# Patient Record
Sex: Male | Born: 1975
Health system: Southern US, Community
[De-identification: ages and names within clinical notes are randomized; demographics above are authoritative.]

## PROBLEM LIST (undated history)

## (undated) DIAGNOSIS — C801 Malignant (primary) neoplasm, unspecified: Secondary | ICD-10-CM

## (undated) HISTORY — PX: FRACTURE SURGERY: SHX138

## (undated) HISTORY — DX: Malignant (primary) neoplasm, unspecified: C80.1

---

## 2002-03-01 ENCOUNTER — Encounter: Payer: Self-pay | Admitting: *Deleted

## 2002-03-01 ENCOUNTER — Emergency Department (HOSPITAL_COMMUNITY): Admission: EM | Admit: 2002-03-01 | Discharge: 2002-03-01 | Payer: Self-pay | Admitting: *Deleted

## 2002-03-11 ENCOUNTER — Ambulatory Visit (HOSPITAL_BASED_OUTPATIENT_CLINIC_OR_DEPARTMENT_OTHER): Admission: RE | Admit: 2002-03-11 | Discharge: 2002-03-12 | Payer: Self-pay | Admitting: Orthopedic Surgery

## 2008-06-23 ENCOUNTER — Emergency Department (HOSPITAL_COMMUNITY): Admission: EM | Admit: 2008-06-23 | Discharge: 2008-06-23 | Payer: Self-pay | Admitting: Emergency Medicine

## 2008-06-23 IMAGING — CR DG CHEST 1V PORT
1 series · 1 of 1 positions shown · non-contrast
Comparison: None.

CLINICAL DATA: Smoker with chest pain shortness of breath.

PORTABLE CHEST - 1 VIEW

[AP]
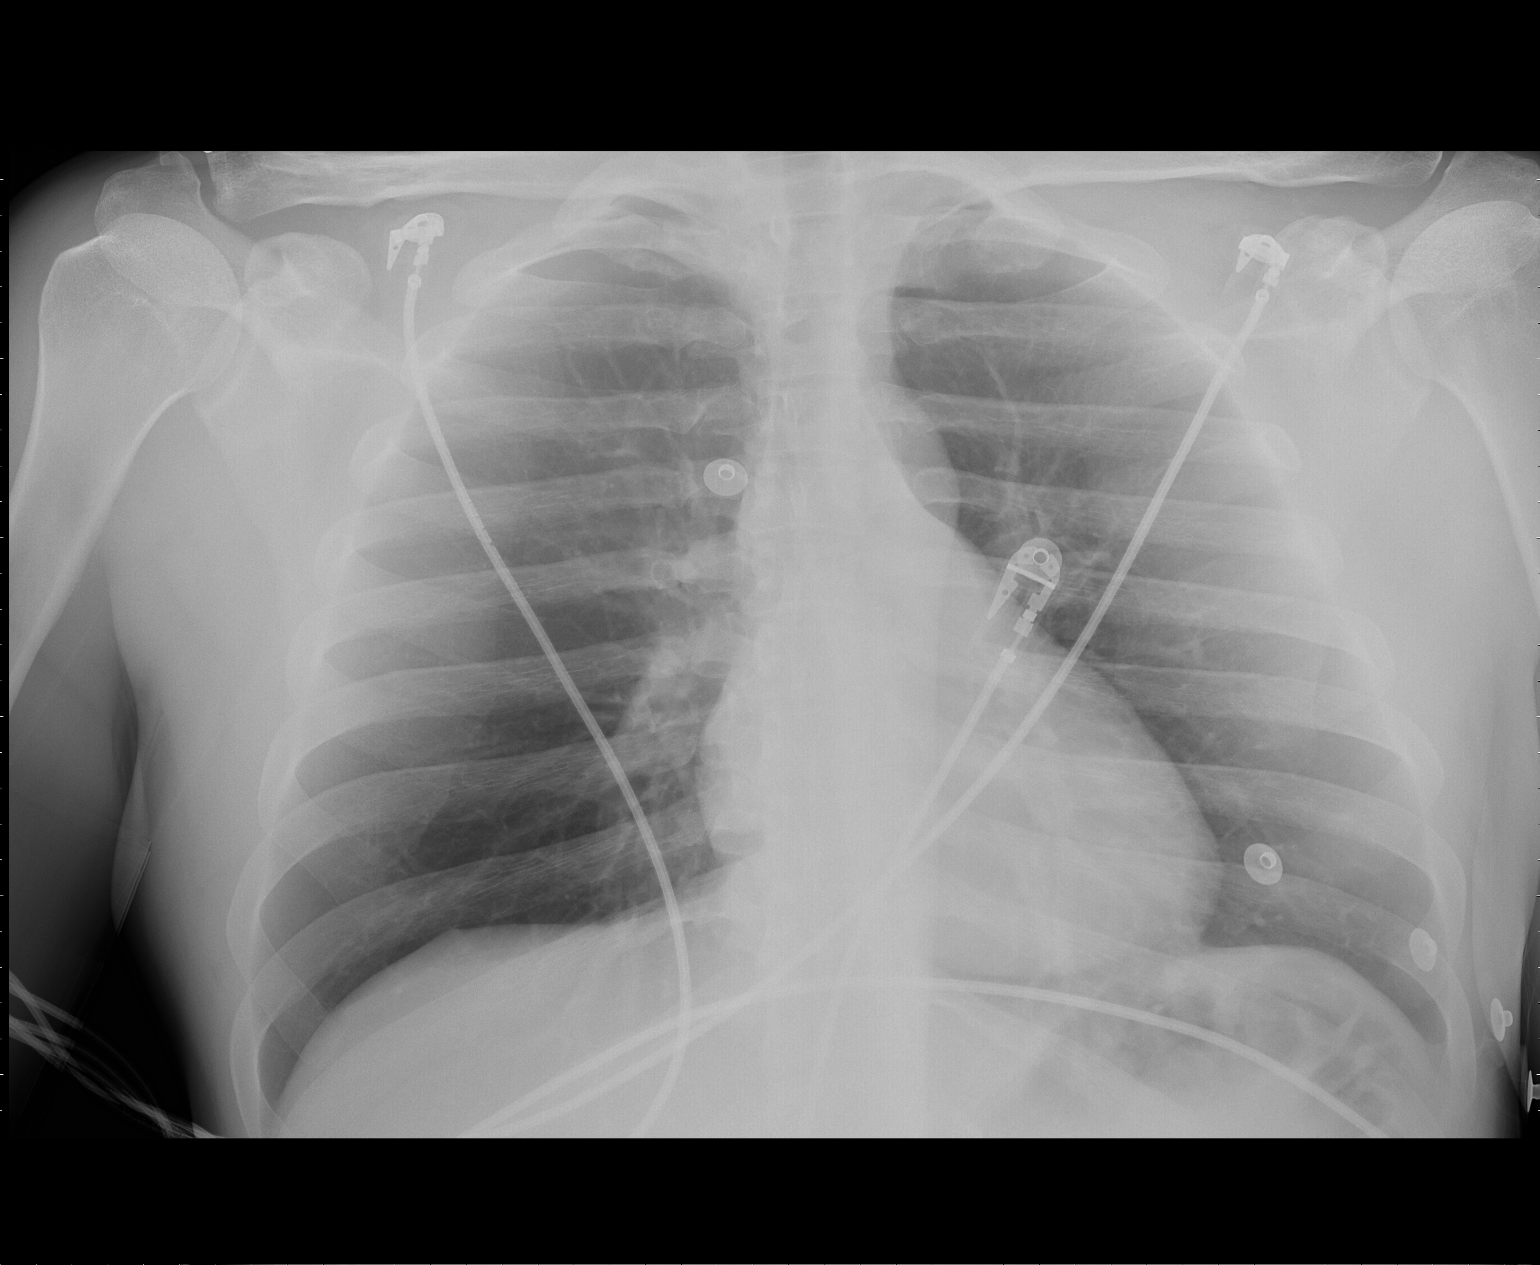

[1 of 1 positions shown; findings below may reference images not displayed]

FINDINGS: Normal sized heart.  Clear lungs with normal vascularity.
Minimal scoliosis.
IMPRESSION: No acute abnormality.

## 2008-06-23 IMAGING — CT CT ANGIO CHEST
2 of 8 series · 19 of 36 positions shown · IV contrast (APPLIED)
Comparison: Portable chest obtained earlier today.

CLINICAL DATA: Chest pain.  Elevated D-dimer.  A history of
shortness of breath was also given.

CT ANGIOGRAPHY CHEST
TECHNIQUE: Multidetector CT imaging of the chest using the
standard protocol during bolus administration of intravenous
contrast. Multiplanar reconstructed images obtained and reviewed to
evaluate the vascular anatomy.
Contrast: 80 ml [9J]

[Series 11: pulm embolism 1.0 b25f thins · axial · 0.71mm/px · z∈[+1177,+1448]mm · 18 of 303 slices shown]
[im 16/303  lung]
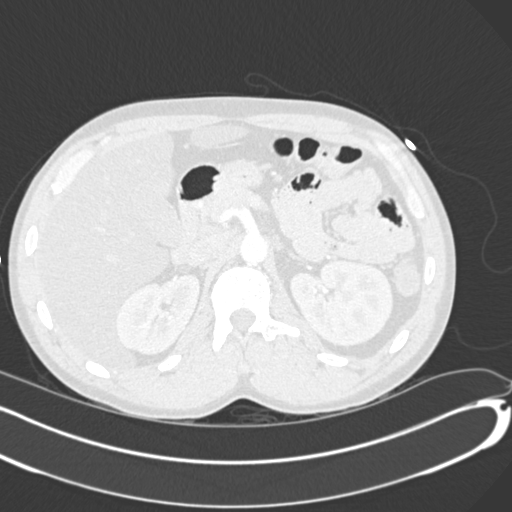
[im 32/303  mediastinal]
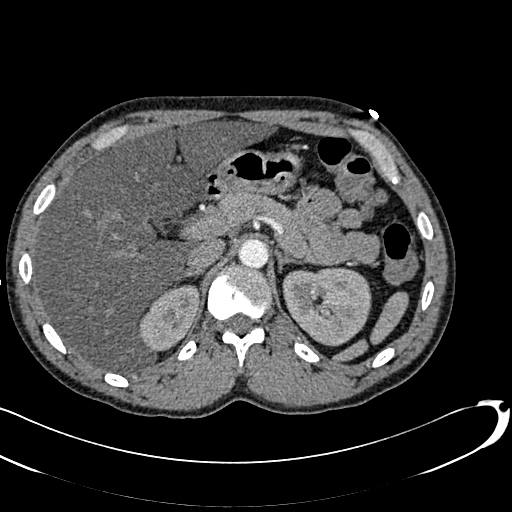
[im 48/303  lung]
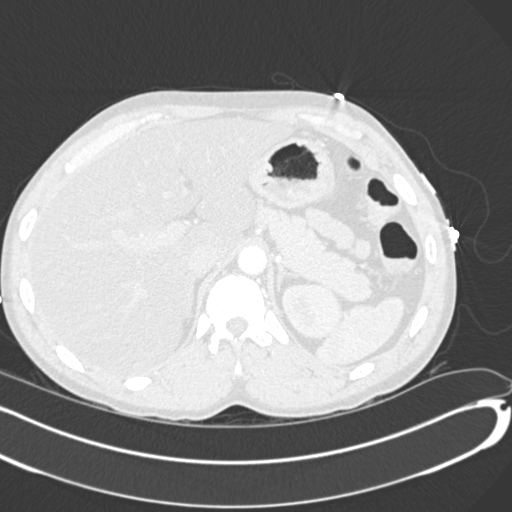
[im 64/303  mediastinal]
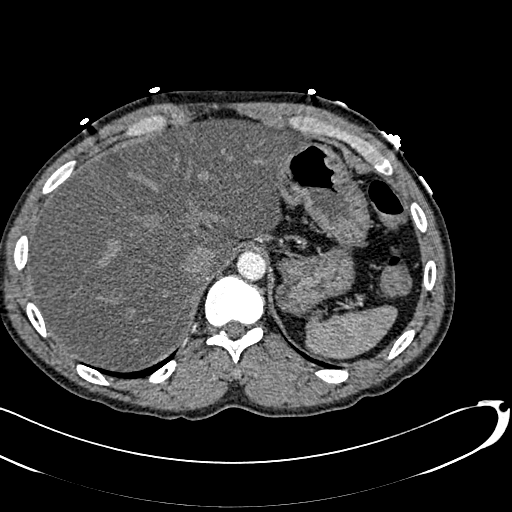
[im 80/303  lung]
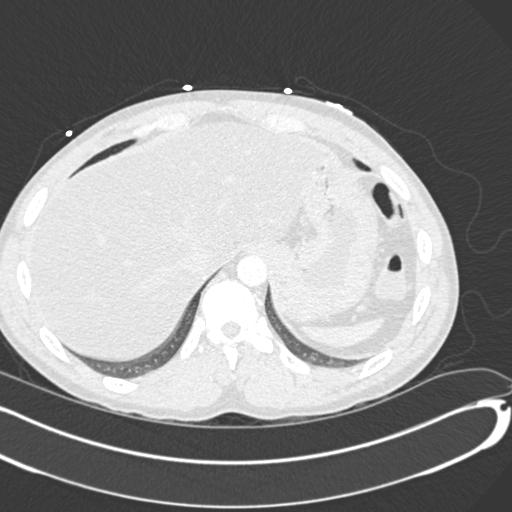
[im 96/303  mediastinal]
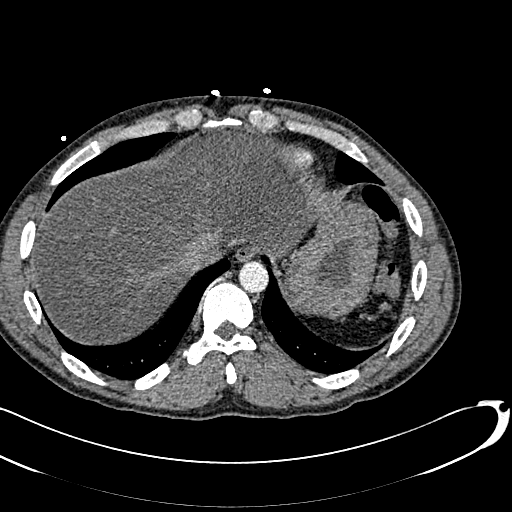
[im 112/303  lung]
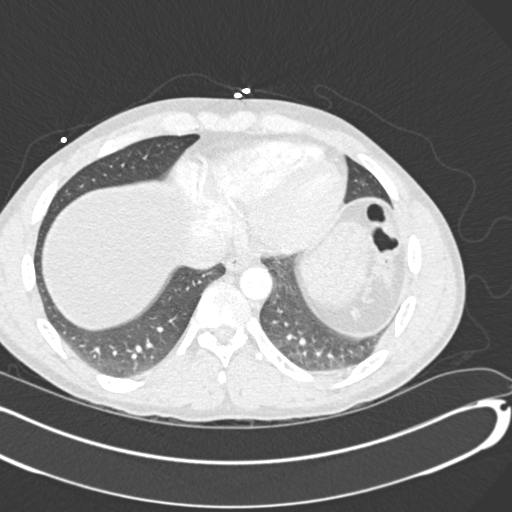
[im 128/303  mediastinal]
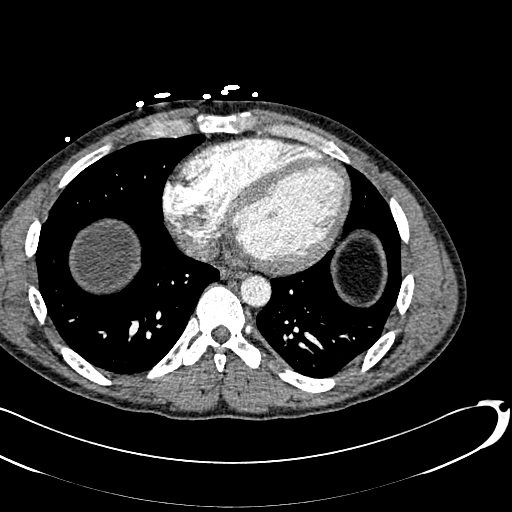
[im 144/303  lung]
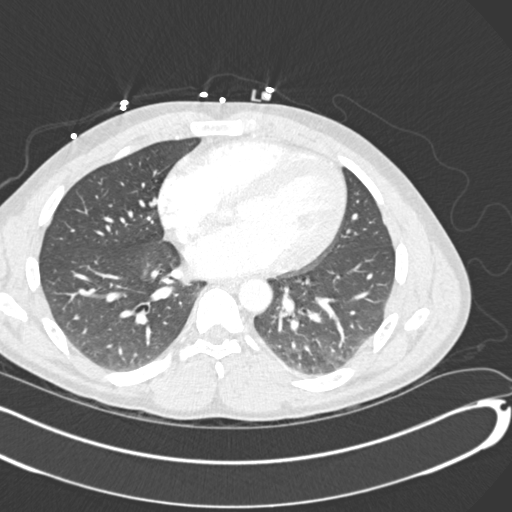
[im 159/303  mediastinal]
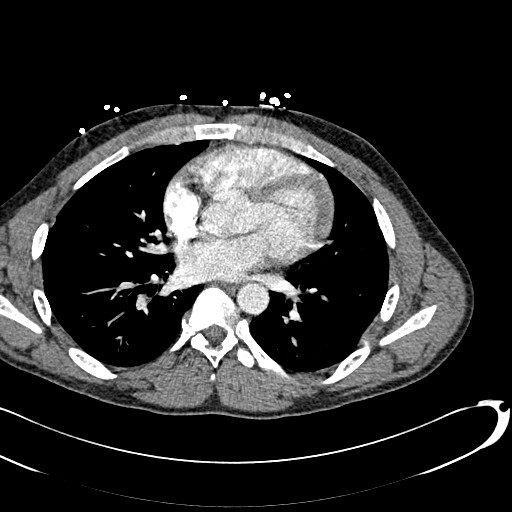
[im 175/303  lung]
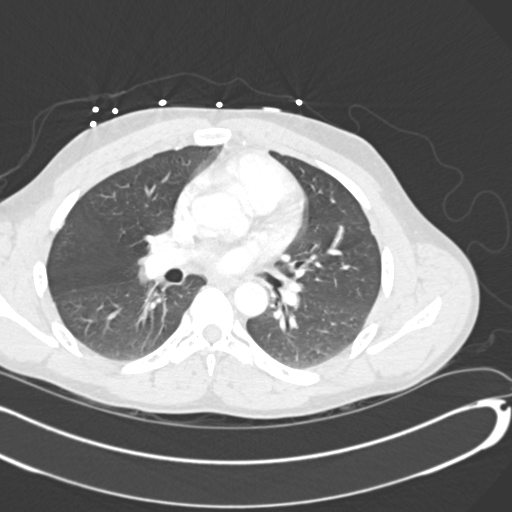
[im 191/303  mediastinal]
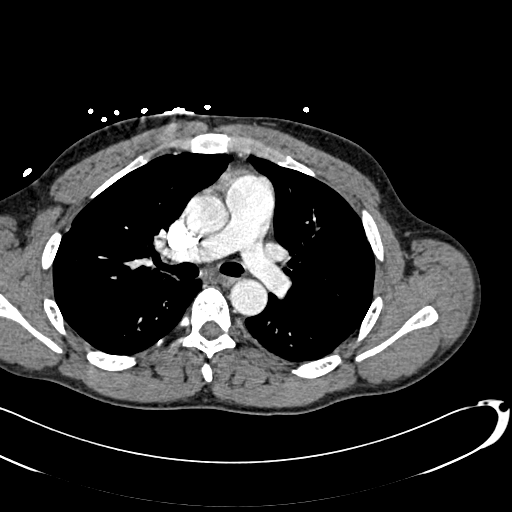
[im 207/303  lung]
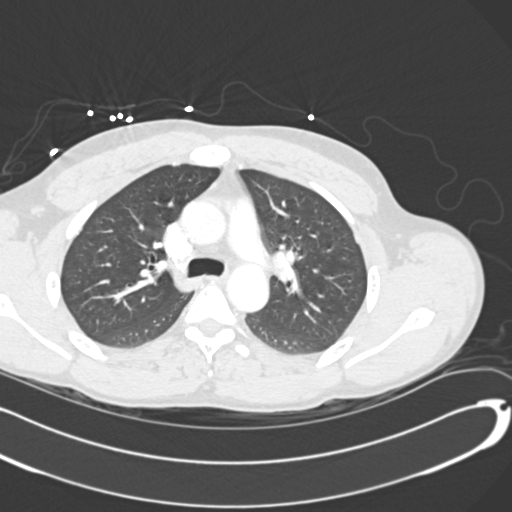
[im 223/303  mediastinal]
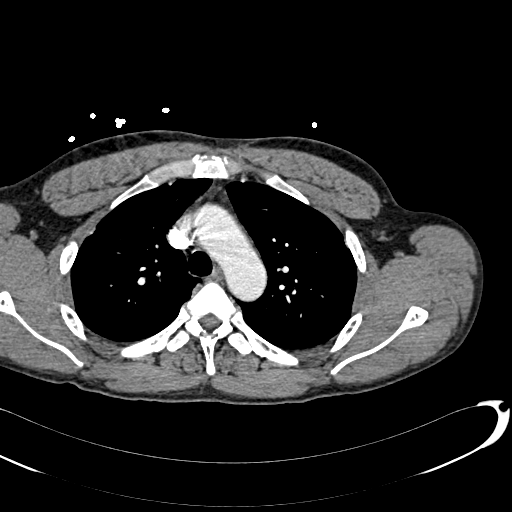
[im 239/303  lung]
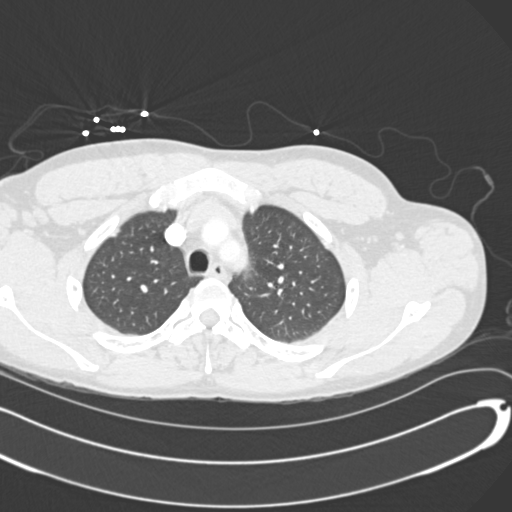
[im 255/303  mediastinal]
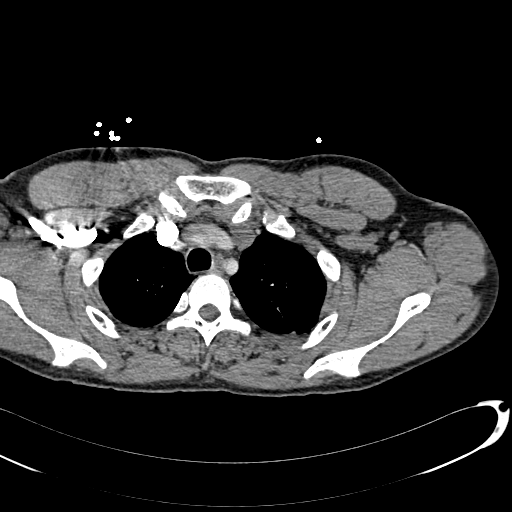
[im 271/303  lung]
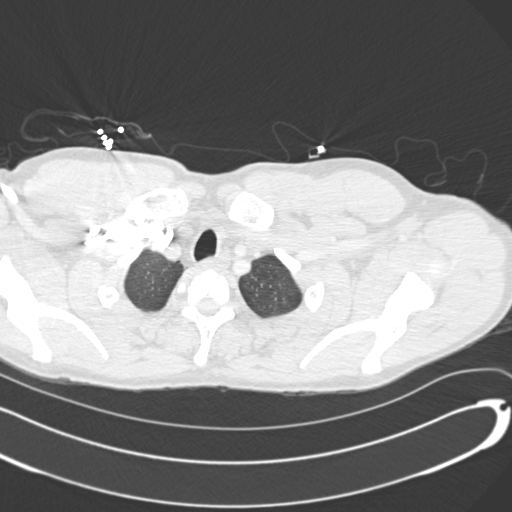
[im 287/303  mediastinal]
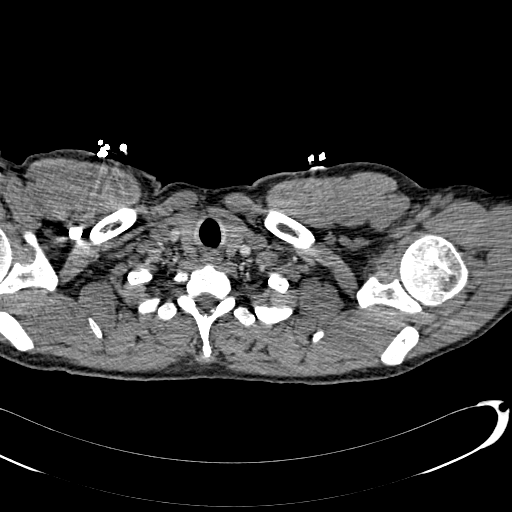

[Series 12: pulm embolism 2.0 spo cor thins · coronal · 0.71mm/px · 1 of 95 slices shown]
[im 48/95  mediastinal]
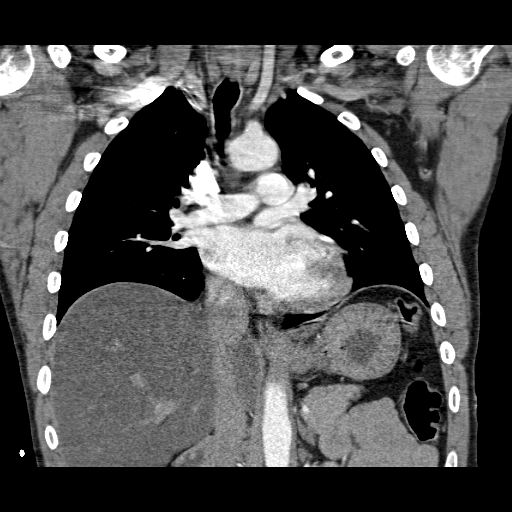

[19 of 36 positions shown; findings below may reference images not displayed]

FINDINGS: Normally opacified pulmonary arteries with no pulmonary
arterial filling defects seen.  Clear lungs.  Marked diffuse fatty
infiltration of the liver.  Mild scoliosis.
IMPRESSION: 1.  No acute abnormality and no pulmonary emboli seen.
2.  Marked fatty infiltration of the liver.

## 2008-06-28 ENCOUNTER — Emergency Department (HOSPITAL_COMMUNITY): Admission: EM | Admit: 2008-06-28 | Discharge: 2008-06-28 | Payer: Self-pay | Admitting: *Deleted

## 2008-06-28 IMAGING — CR DG CHEST 2V
2 series · 2 of 2 positions shown · non-contrast
Comparison: Chest CT scan [DATE].

CLINICAL DATA: Chest pain shortness of breath.

CHEST - 2 VIEW

[w chest pa *]
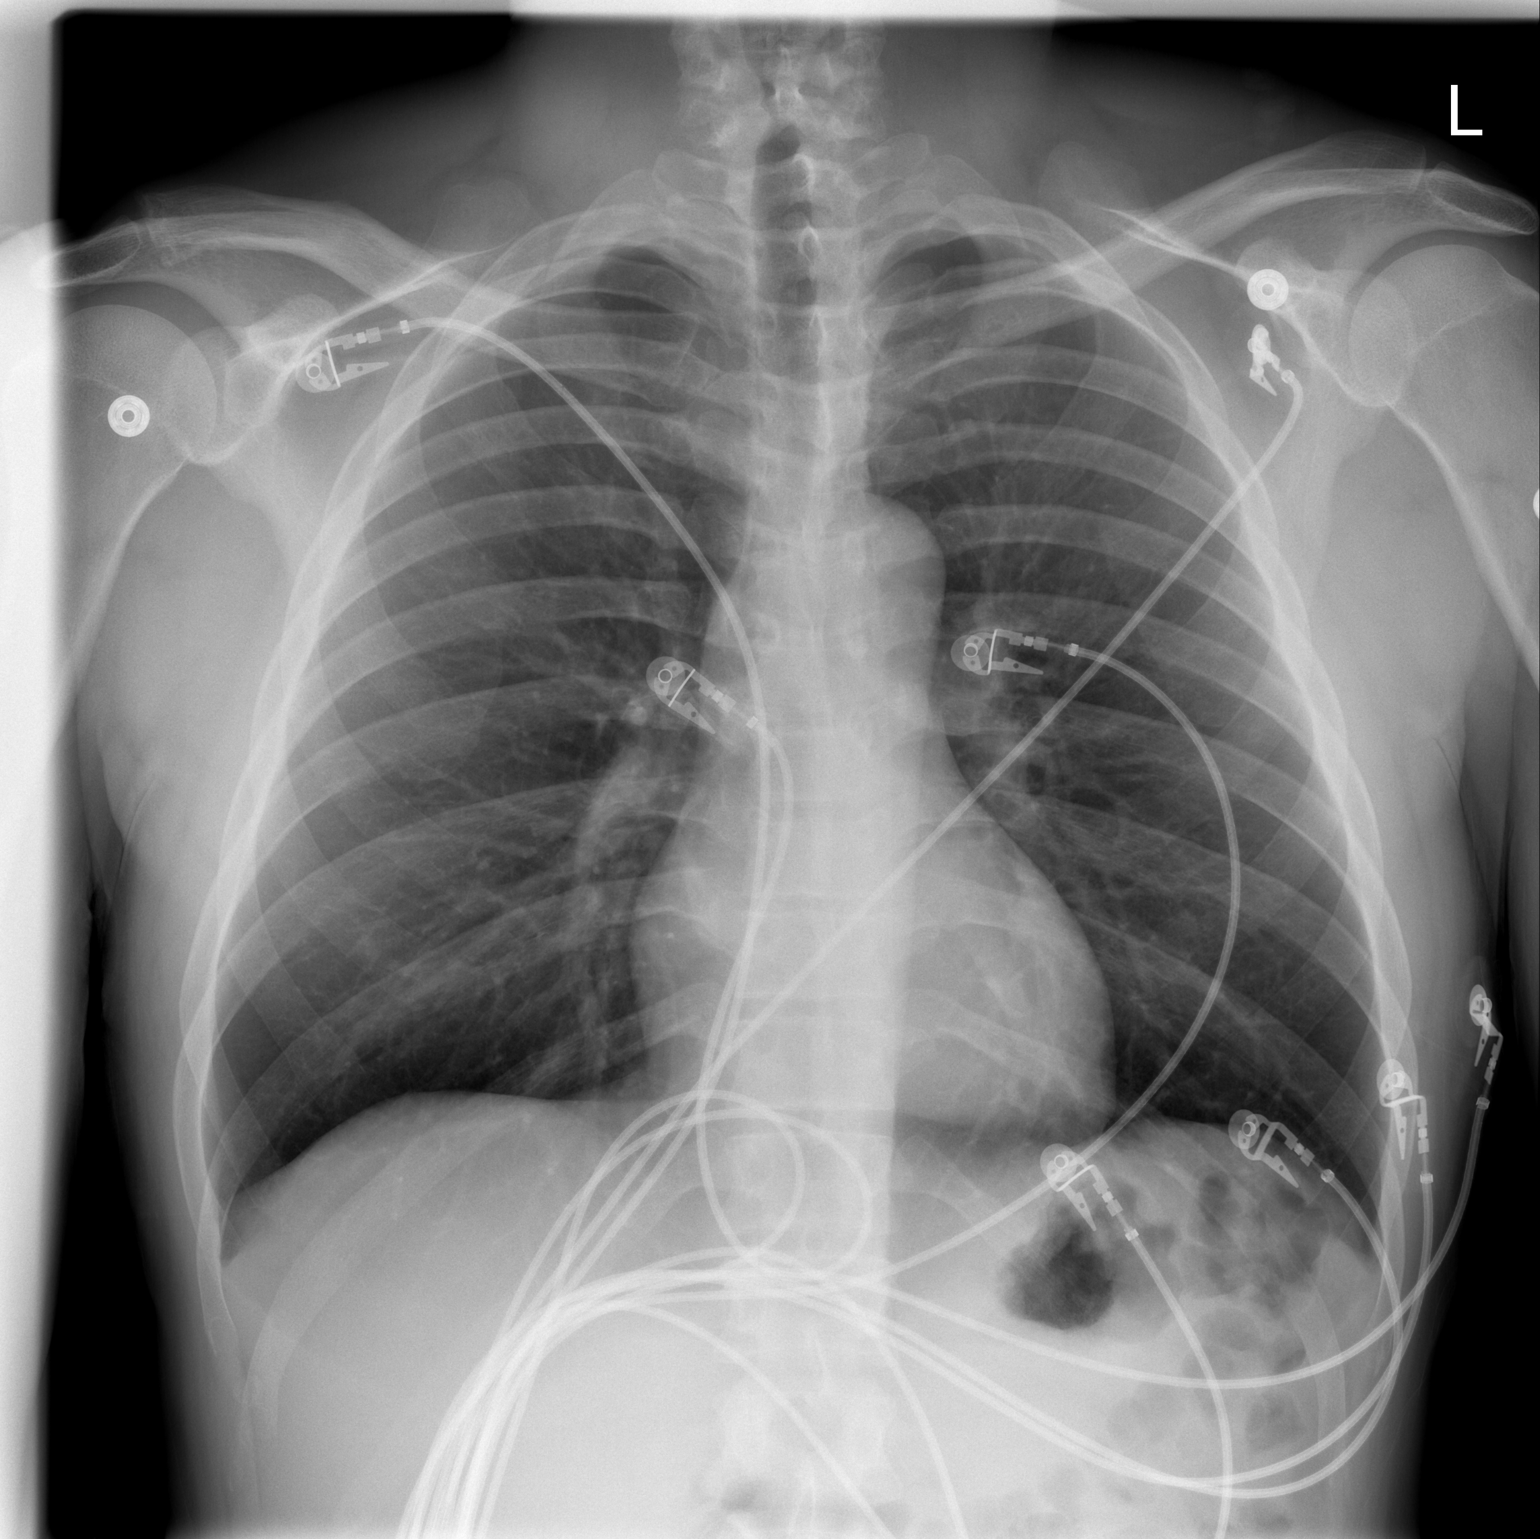

[w chest lat]
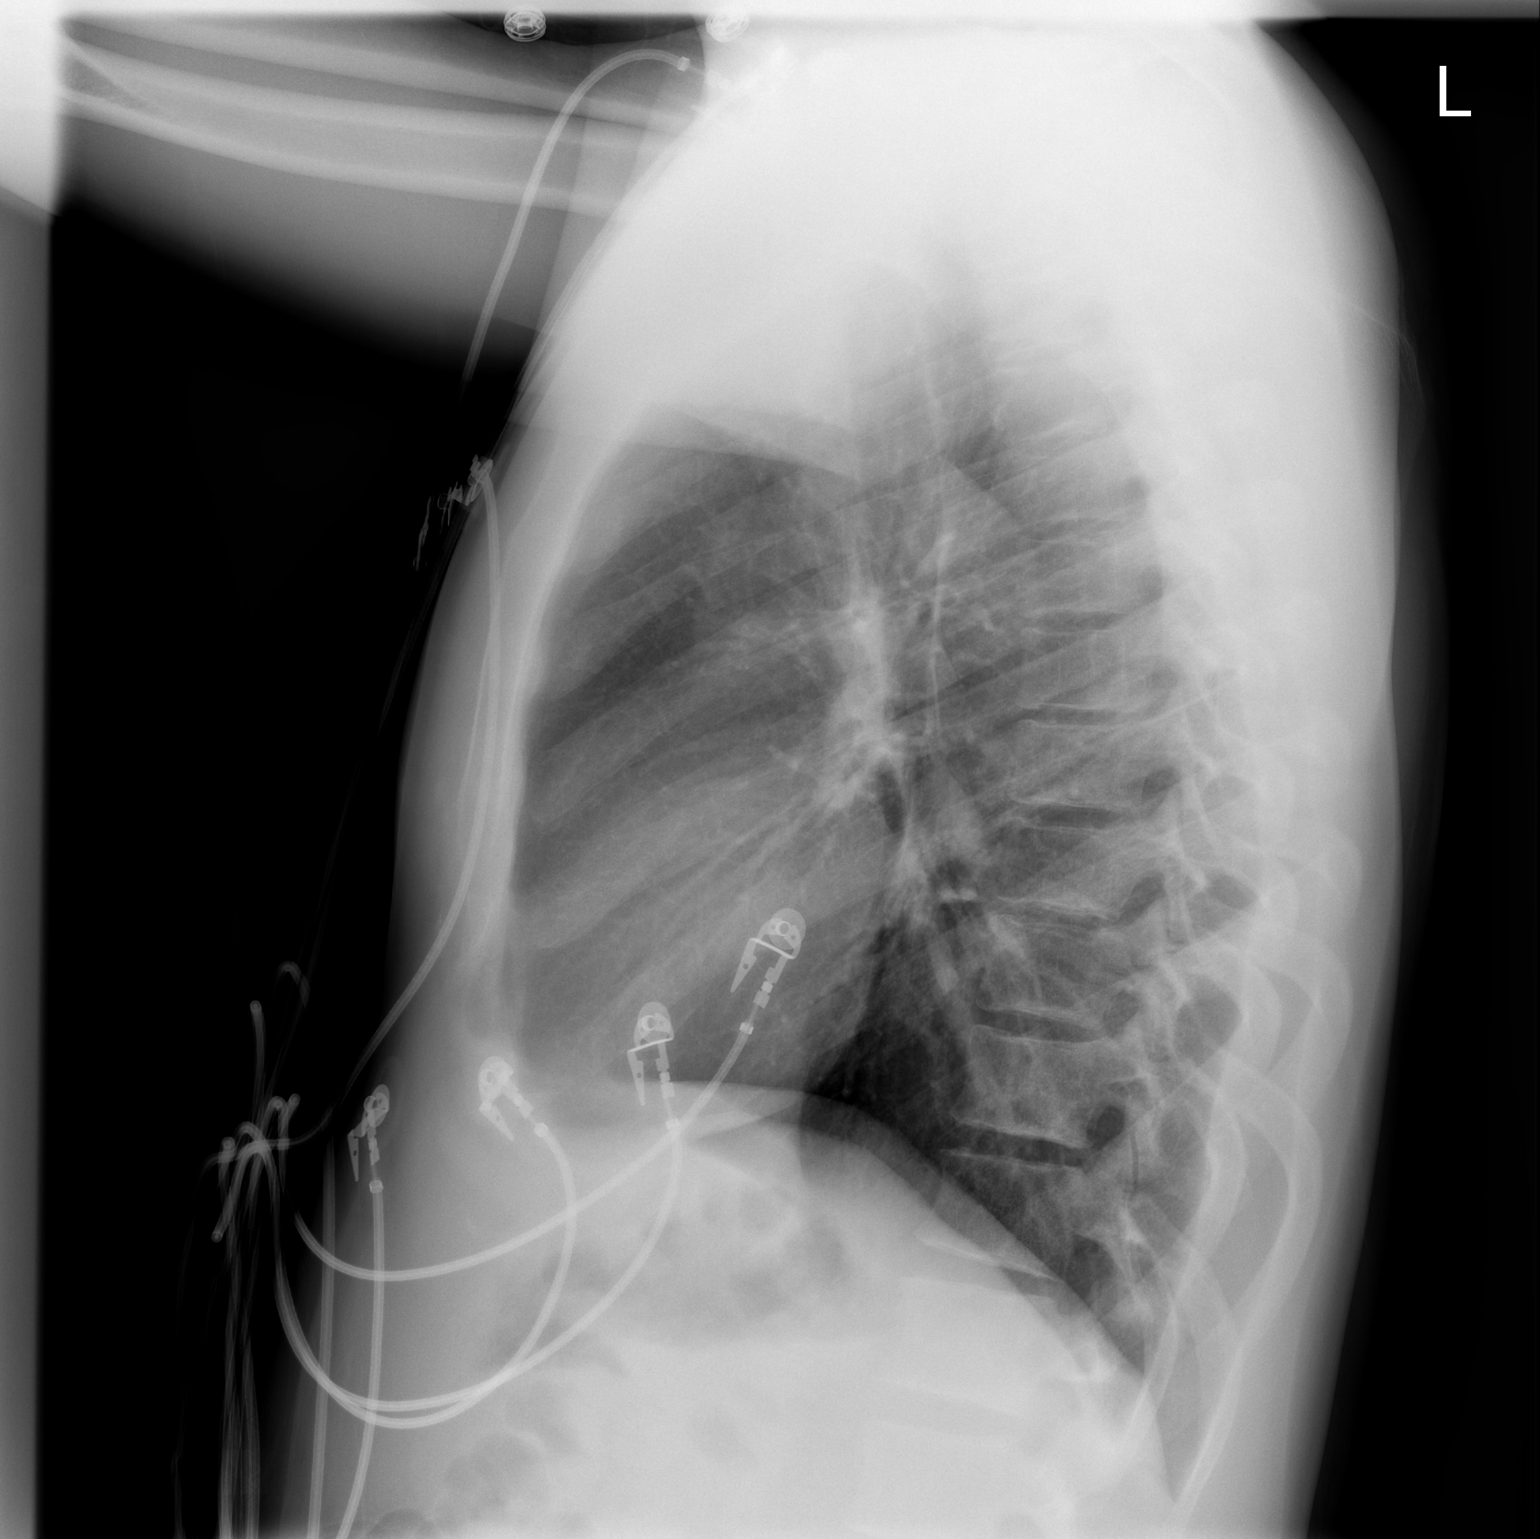

[2 of 2 positions shown; findings below may reference images not displayed]

FINDINGS: The lungs are clear.  Heart size is normal.  There is no
pleural effusion or focal bony abnormality.
IMPRESSION: Negative chest.

## 2008-12-06 ENCOUNTER — Observation Stay (HOSPITAL_COMMUNITY): Admission: EM | Admit: 2008-12-06 | Discharge: 2008-12-07 | Payer: Self-pay | Admitting: Emergency Medicine

## 2009-01-01 ENCOUNTER — Ambulatory Visit: Payer: Self-pay | Admitting: Family Medicine

## 2009-01-04 ENCOUNTER — Ambulatory Visit: Payer: Self-pay | Admitting: *Deleted

## 2010-02-06 ENCOUNTER — Emergency Department (HOSPITAL_COMMUNITY): Admission: EM | Admit: 2010-02-06 | Discharge: 2010-02-07 | Payer: Self-pay | Admitting: Emergency Medicine

## 2010-02-07 IMAGING — CR DG ABDOMEN ACUTE W/ 1V CHEST
3 series · 3 of 3 positions shown · non-contrast
Comparison: None

CLINICAL DATA: Nausea, vomiting and abdominal pain.

ACUTE ABDOMEN SERIES (ABDOMEN 2 VIEW & CHEST 1 VIEW)

[w chest pa]
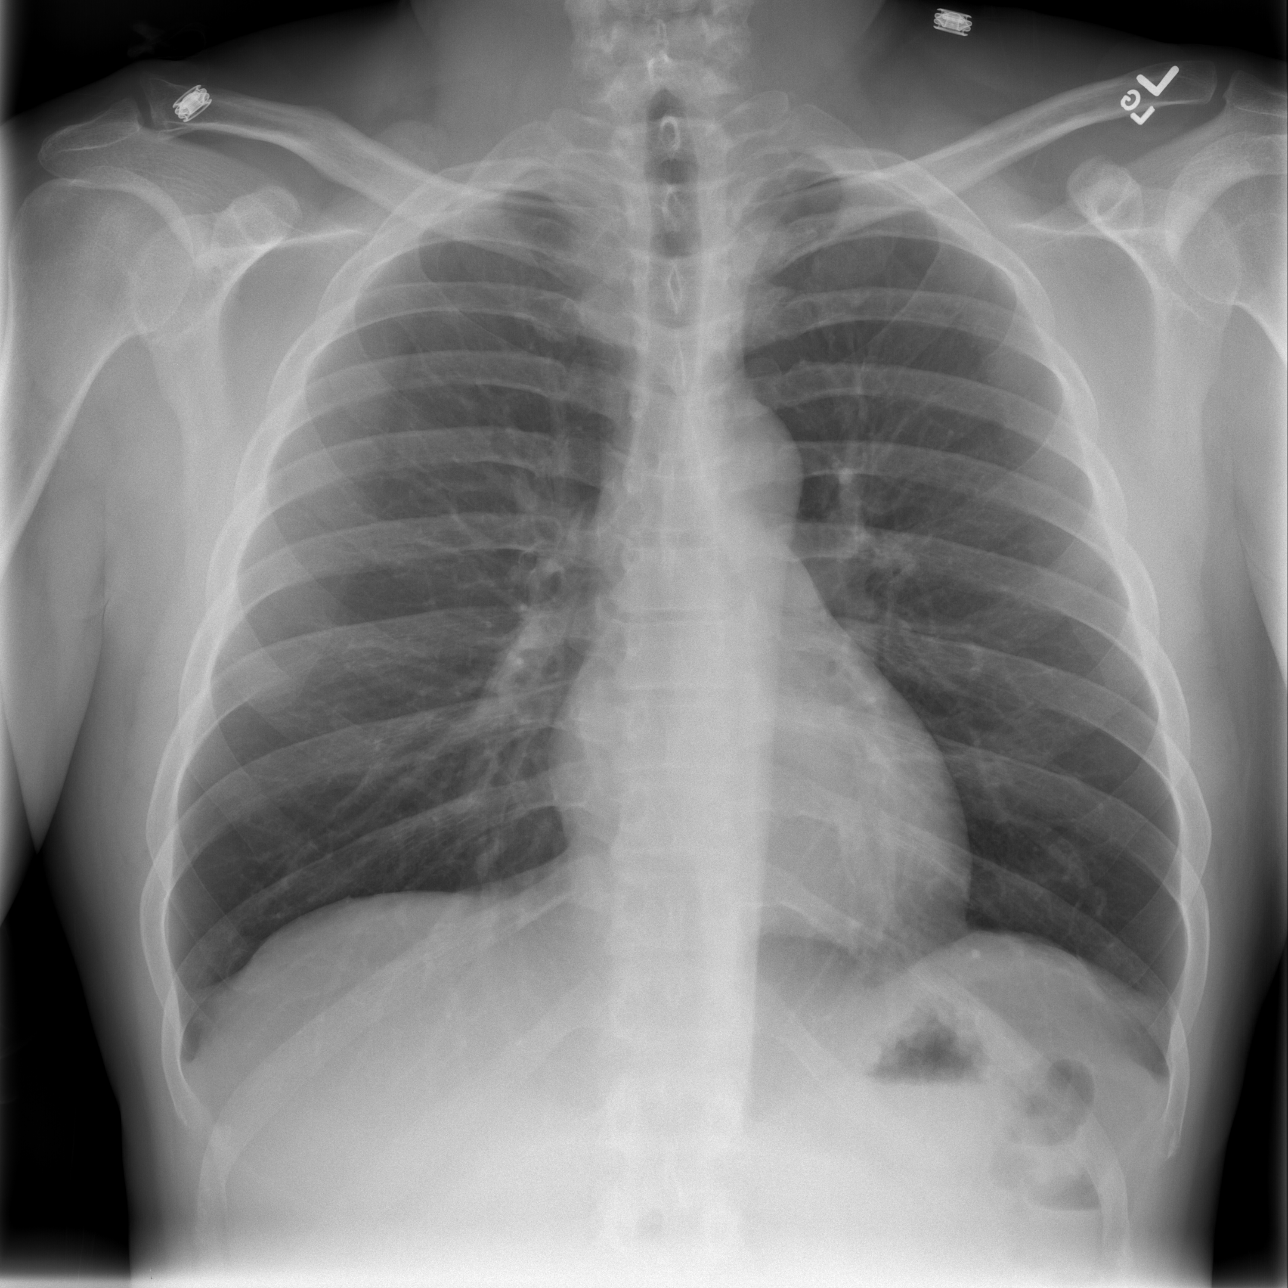

[w abdomen upright *]
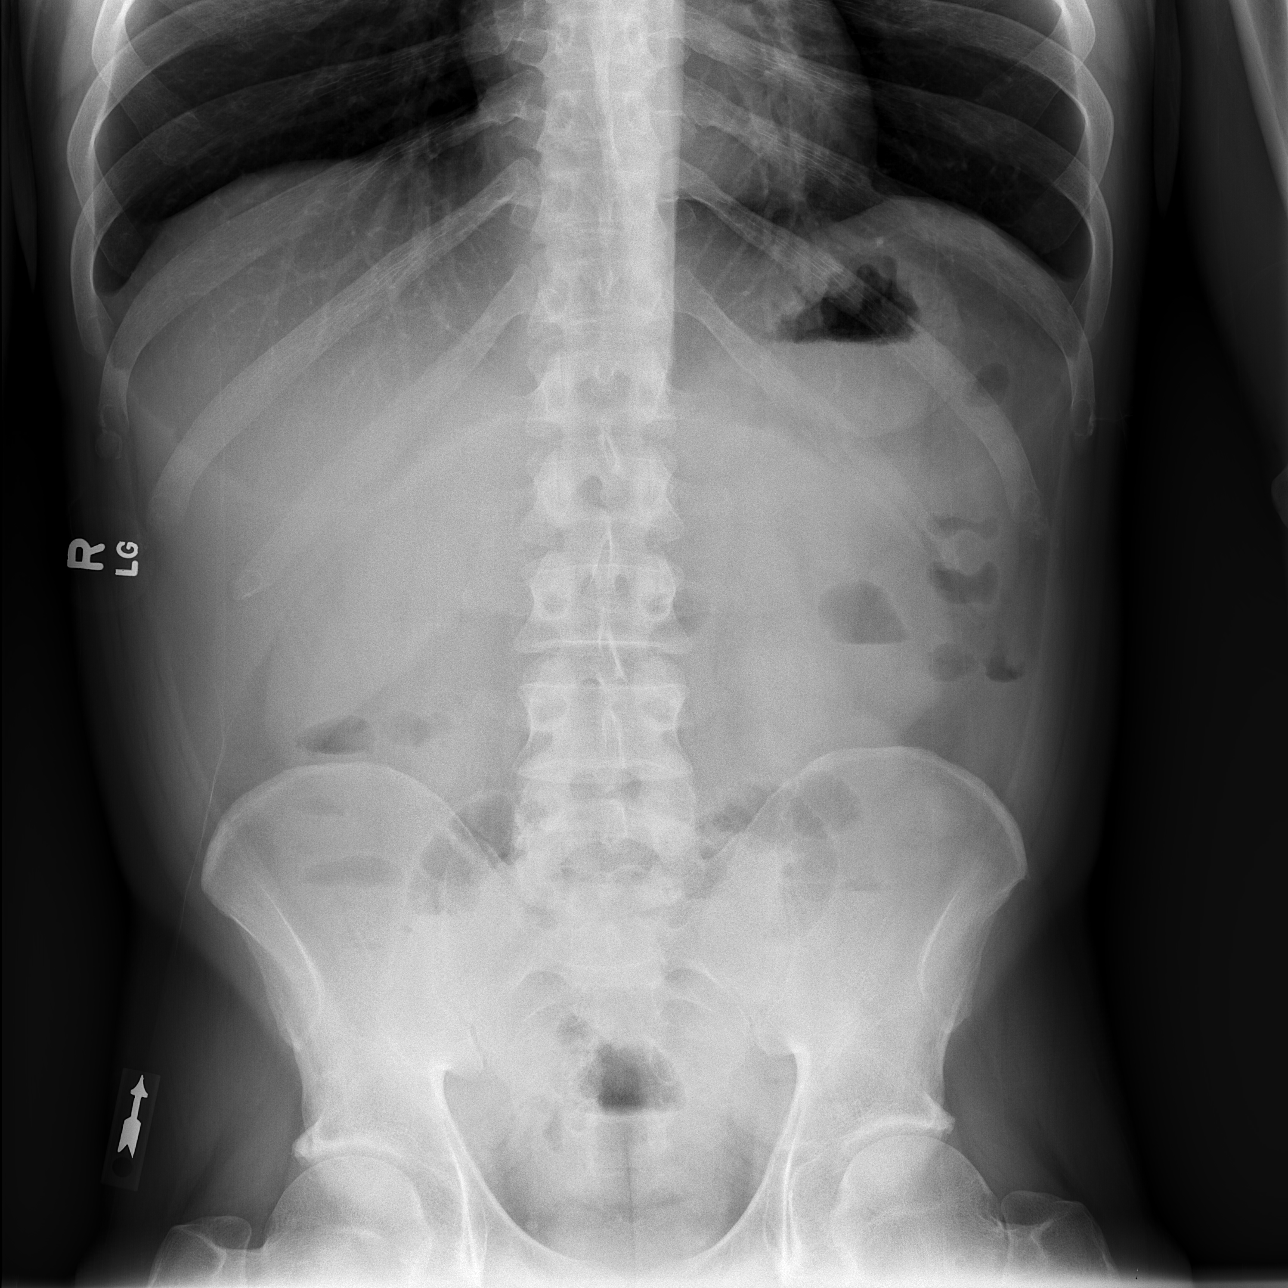

[t abdomen supine]
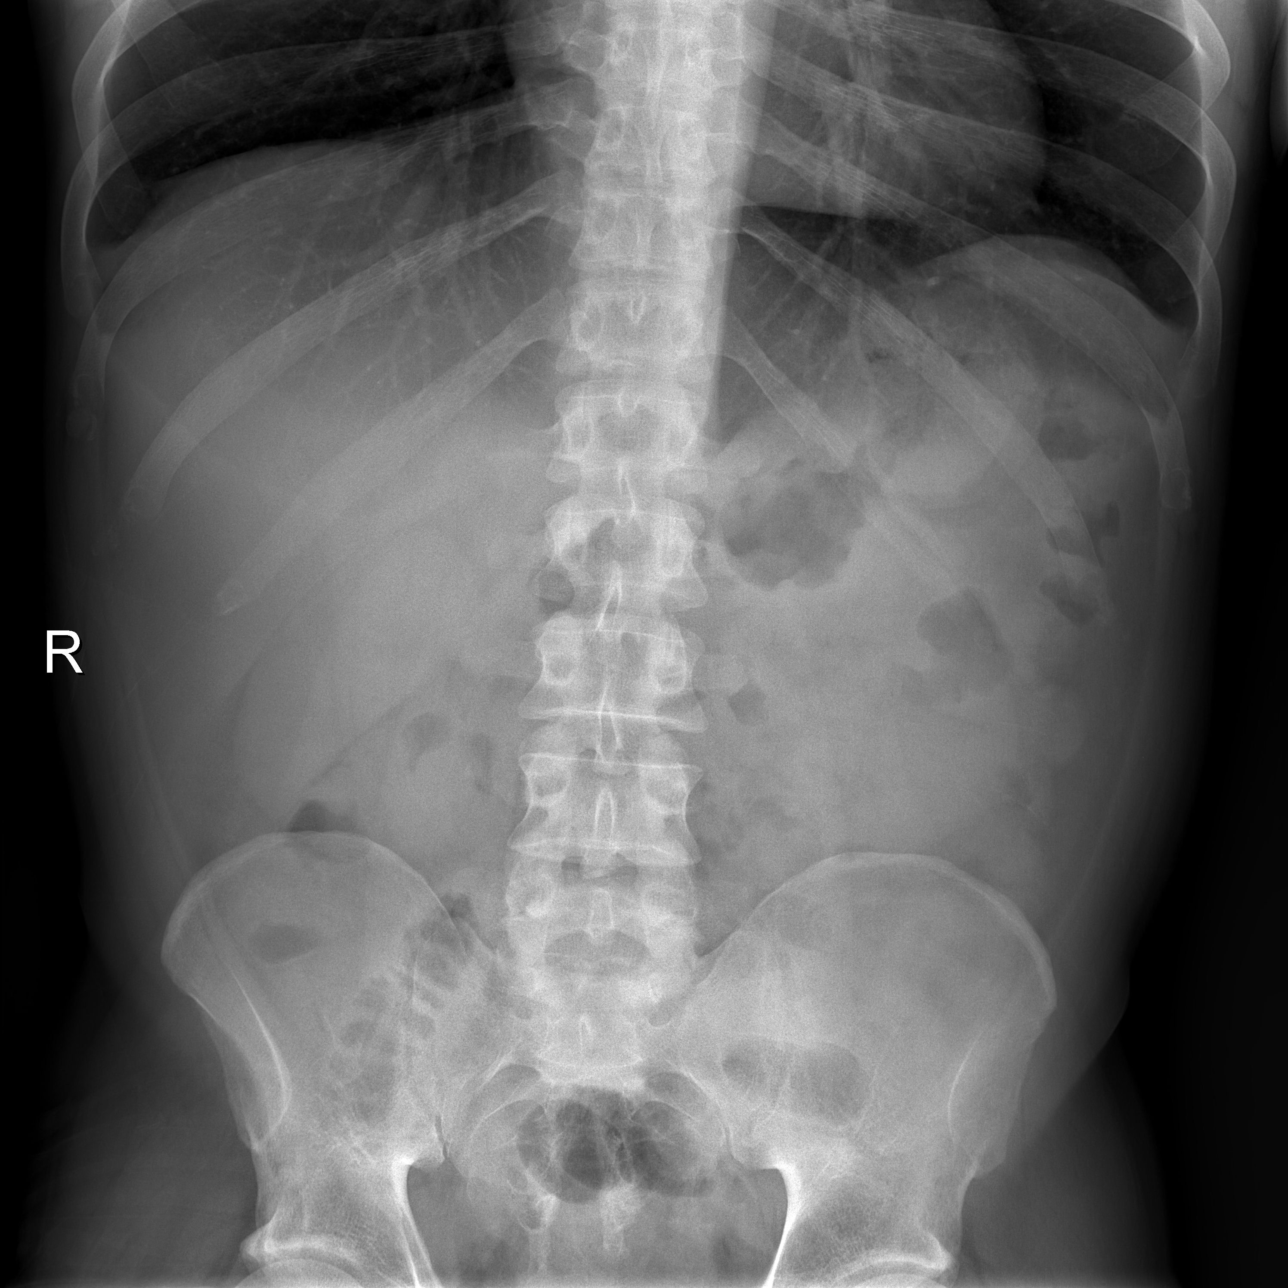

[3 of 3 positions shown; findings below may reference images not displayed]

FINDINGS: The upright chest x-ray demonstrates no acute
cardiopulmonary findings.

Two views of the abdomen demonstrates scattered air in the colon.
There are small bowel loops with air but no significant distention.
There are a few air-fluid levels.  The soft tissue shadows of the
abdomen are maintained.  No worrisome calcifications are seen.  The
bony structures are unremarkable.
IMPRESSION: 1.  No acute cardiopulmonary findings.
2.  Unremarkable/nonspecific bowel gas pattern.  Possible
gastroenteritis.

## 2011-01-18 LAB — COMPREHENSIVE METABOLIC PANEL
AST: 191 U/L — ABNORMAL HIGH (ref 0–37)
Alkaline Phosphatase: 69 U/L (ref 39–117)
CO2: 27 mEq/L (ref 19–32)
Chloride: 96 mEq/L (ref 96–112)
Creatinine, Ser: 0.86 mg/dL (ref 0.4–1.5)
GFR calc Af Amer: >60 mL/min (ref >60)
GFR calc non Af Amer: >60 mL/min (ref >60)
Potassium: 3.6 mEq/L (ref 3.5–5.1)
Total Bilirubin: 1.1 mg/dL (ref 0.3–1.2)

## 2011-01-18 LAB — URINALYSIS, ROUTINE W REFLEX MICROSCOPIC
Ketones, ur: 40 mg/dL — AB
Nitrite: NEGATIVE
Protein, ur: 100 mg/dL — AB

## 2011-01-18 LAB — CBC
HCT: 41.4 % (ref 39.0–52.0)
MCV: 96.7 fL (ref 78.0–100.0)
RBC: 4.29 MIL/uL (ref 4.22–5.81)
WBC: 5.4 10*3/uL (ref 4.0–10.5)

## 2011-01-18 LAB — DIFFERENTIAL
Basophils Absolute: 0 10*3/uL (ref 0.0–0.1)
Basophils Relative: 0 % (ref 0–1)
Eosinophils Absolute: 0 10*3/uL (ref 0.0–0.7)
Eosinophils Relative: 0 % (ref 0–5)
Lymphocytes Relative: 4 % — ABNORMAL LOW (ref 12–46)
Monocytes Absolute: 0.3 10*3/uL (ref 0.1–1.0)

## 2011-01-18 LAB — ETHANOL: Alcohol, Ethyl (B): <5 mg/dL (ref 0–10)

## 2011-01-18 LAB — URINE MICROSCOPIC-ADD ON

## 2011-01-18 LAB — LIPASE, BLOOD: Lipase: 33 U/L (ref 11–59)

## 2011-02-14 LAB — BASIC METABOLIC PANEL
BUN: 6 mg/dL (ref 6–23)
Calcium: 9 mg/dL (ref 8.4–10.5)
Creatinine, Ser: 0.74 mg/dL (ref 0.4–1.5)
GFR calc non Af Amer: >60 mL/min (ref >60)
Glucose, Bld: 79 mg/dL (ref 70–99)
Potassium: 4.6 mEq/L (ref 3.5–5.1)

## 2011-02-14 LAB — DIFFERENTIAL
Basophils Absolute: 0 10*3/uL (ref 0.0–0.1)
Eosinophils Relative: 0 % (ref 0–5)
Lymphocytes Relative: 39 % (ref 12–46)
Lymphs Abs: 2.2 10*3/uL (ref 0.7–4.0)
Neutro Abs: 2.8 10*3/uL (ref 1.7–7.7)
Neutrophils Relative %: 49 % (ref 43–77)

## 2011-02-14 LAB — CBC
Platelets: 207 10*3/uL (ref 150–400)
RDW: 13.8 % (ref 11.5–15.5)
WBC: 5.7 10*3/uL (ref 4.0–10.5)

## 2011-03-14 NOTE — Op Note (Signed)
NAMECREWE, HEATHMAN                ACCOUNT NO.:  [id number]   MEDICAL RECORD NO.:  [id number]          PATIENT TYPE:  INP   LOCATION:  5021                         FACILITY:  [facility]   PHYSICIAN:  Dionne Ano. Gramig, M.D.DATE OF BIRTH:  03/28/1976   DATE OF PROCEDURE:  12/06/2008  DATE OF DISCHARGE:  12/07/2008                               OPERATIVE REPORT   PREOPERATIVE DIAGNOSIS:  Right hand laceration, extensive in nature with  tendon and ligament involvement.   POSTOPERATIVE DIAGNOSIS:  Right hand laceration, extensive in nature  with tendon and ligament involvement.   PROCEDURE:  1. Irrigation and debridement, right hand.  2. Fasciotomy, right hand.  3. Extensor digitorum communis, repair to the index finger.  4. Extensor digitorum indicis proprius, repair to the index finger.  5. Stress radiography.  6. Incision and drainage, open left index finger, metacarpal      phalangeal joint.  7. Ulnar collateral ligament repair to the index finger, right hand.   SURGEON:  Dionne Ano. Amanda Pea, MD   ASSISTANT:  None.   COMPLICATIONS:  None.   ANESTHESIA:  General.   TOURNIQUET TIME:  Less than an hour.   INDICATIONS FOR PROCEDURE:  This patient presented in the evening of  December 26, 2008 after a large laceration to his hand.  I was asked to  see him and treat him.  He had an 8-10 inch laceration with loss of  tendon function.  He was counseled extensively and desired to proceed  the above-mentioned operative intervention due to the dysfunction in his  hand.   OPERATION IN DETAIL:  The patient was seen by myself and anesthesia,  consent verified,  arm marked, tourniquet applied.  He was given a  general anesthesia by the anesthesia department, prepped and draped in  the usual sterile fashion.  Once this done, he underwent I and D of  skin, subcutaneous tissue, tendon, muscle about the right hand.  I then  performed a fasciotomy of the right hand to release tight  musculature.  This done without difficulty.  Following this, the irrigation, I then  performed extensor digitorum communis, evaluation about the index finger  which was lacerated as was the extensor indicis proprius.  At this time,  I did open the MCP joint as he had an open injury.  This underwent  irrigation and debridement of the open injury.  Following this, ulnar  collateral ligament repaired to the index finger was accomplished with  suture material and next with suture of the braided nonabsorbable  variety (FiberWire).  The index finger tendons, both the extensor  digitorum communis and the  extensor indices proprius were repaired.  This was tolerated well.  It  was then irrigated, tourniquet deflated, and wound was closed with  Prolene.  Splint was placed.  He was admitted for IV antibiotics, postop  observation.   Dictator asked to cancel this dictation.      Dionne Ano. Amanda Pea, M.D.  Electronically Signed     WMG/MEDQ  D:  03/18/2009  T:  03/19/2009  Job:  [id number]

## 2011-03-14 NOTE — Op Note (Signed)
[name], [name]                ACCOUNT NO.:  [id number]   MEDICAL RECORD NO.:  [id number]          PATIENT TYPE:  INP   LOCATION:  5021                         FACILITY:  [facility]   PHYSICIAN:  Dionne Ano. Gramig, M.D.DATE OF BIRTH:  May 05, 1976   DATE OF PROCEDURE:  12/06/2008  DATE OF DISCHARGE:                               OPERATIVE REPORT   PREOPERATIVE DIAGNOSIS:  A 10-inch laceration with tendon ligament and  muscle destruction, right hand index finger and wrist.   POSTOPERATIVE DIAGNOSIS:  A 10-inch laceration with tendon ligament and  muscle destruction, right hand index finger and wrist.   PROCEDURE:  1. Irrigation and debridement, skin subcutaneous tissue, muscle, and      tendon, right hand, wrist, and index finger.  2. Right metacarpophalangeal joint arthrotomy, debridement, and      limited synovectomy.  3. Ulnar collateral ligament repair, right index finger      metacarpophalangeal joint.  4. Stress radiography.  5. Extensor digitorum communis repair, right hand.  6. Extensor indicis proprius repair, right hand to the index finger.  7. Fasciotomy, right hand.   SURGEON:  Dionne Ano. Amanda Pea, MD   ASSISTANTS:  None.   COMPLICATIONS:  None.   ANESTHESIA:  General.   TOURNIQUET TIME:  Less than an hour.   INDICATIONS FOR PROCEDURE:  This patient is a 35 year old male who  presents with above-mentioned diagnosis.  He sustained a severe  laceration to the right upper extremity.  I saw him in the emergency  room.  He was immediately booked for surgical intervention in the form  of I and D repair of structures as necessary as noted above.  He has a  severe injury with laceration.  He denies prior trauma to the region.   The patient was taken to the operative arena.  Consent was signed and  verified.  Time-out was called.  Preoperative antibiotics were given in  form of Ancef.  He was given general anesthetic in the operative arena,  laid supine and properly  padded, prepped and draped in the usual sterile  fashion with Betadine scrub and paint.  Tourniquet was applied and  insufflated.  Following this I and D of skin, subcutaneous tissue,  muscle tendon, and periosteal tissue was applied.  He tolerated this  well.  This was an excisional debridement.  The index finger MCP joint  had ulnar collateral ligament encroachment and this was irrigated.  The  MCP joint had an open area.  This underwent I and D arthrotomy and  synovectomy.  Following this, I repaired the ulnar collateral ligament  with FiberWire suture.  The ulnar collateral ligament about the MCP  joint index finger was repaired to my satisfaction without difficulty.   Following this, I completed fasciotomy about the hand where significant  muscle encroachment had occurred.  This was done without difficulty.  Following this repair, I then turned attention towards the EDC to the  index finger and the EIP to the index finger.  The patient underwent  repair with 3-0 FiberWire suture.  Multiple four-strand repairs were  accomplished about both  tendons.  The patient tolerated this well.  Tourniquet was deflated.  Hemostasis was obtained with bipolar  electrocautery.  The superficial radial nerve branch terminal divisions  to the index finger was identified, underwent a neurolysis, and was  intact about the area of the index finger.  Following this, skin edge  was irrigated.  A prior 1-mm rim of skin had been removed with the I and  D and the wound was sutured over TLS drain.  The patient tolerated this  well.   Thus, the patient underwent I and D, arthrotomy, and synovectomy of the  MCP joint with ulnar collateral ligament repair as well as EDC tendon  repair to the index finger, EIP tendon repair, fasciotomy of the hand,  and at the conclusion of case, stress radiography was accomplished  revealing an excellent position of the bones and joints.  I was pleased  with the findings.  Wound  was closed with Prolene.  TLS drain was placed  and then a splint was placed without difficulty to keep the fingers in  extension.  The patient tolerated this well.  There were no complicating  features.  To be admitted for IV antibiotics, general postop  observation.  Return to see me in the office in 7-10 days once he is  discharged.  It was a pleasure to see him today.  I discussed all issues  with his family and all questions have been encouraged and answered.      Dionne Ano. Amanda Pea, M.D.  Electronically Signed     WMG/MEDQ  D:  12/06/2008  T:  12/07/2008  Job:  [id number]

## 2011-03-17 NOTE — Op Note (Signed)
[place]  Patient:    Alex, Sexton Visit Number: [id number] MRN: [id number]          Service Type: DSU Location: [facility] Attending Physician:  Milly Jakob Dictated by:   Harvie Junior, M.D. Proc. Date: 03/11/02 Admit Date:  03/11/2002 Discharge Date: 03/12/2002                             Operative Report  IDENTIFYING INFORMATION:  This is a 35 year old male with no previous surgeries.  PREOPERATIVE DIAGNOSIS:  Weber B ankle fracture with mortise widening.  POSTOPERATIVE DIAGNOSIS:  Weber B ankle fracture with mortise widening.  PRINCIPAL PROCEDURE:  Open reduction internal fixation of left ankle fracture.  SURGEON:  Harvie Junior, M.D.  ASSISTANT:  Currie Paris. Thedore Mins.  ANESTHESIA:  General.  BRIEF HISTORY:  This is a 35 year old male with a long history of having a twisting-type type injury to his ankle. He was ultimately evaluated in the emergency room and noted to have an ankle fracture and was sent over for evaluation at that time. You could see widening of his fibular fracture but the mortise appeared to be intact. He was too swollen for cast immobilization at that point and ultimately was splinted. He came back a week later, and repeat x-ray showed that he had significantly widened out the mortise on the fracture site at that point. It was felt that he needed open reduction internal fixation, and he was brought to the operating room for this procedure.  PROCEDURE:  The patient was brought to the operating room and after adequate anesthesia was obtained via general anesthetic, the patient was placed on the supine on the operating room table. The left leg was then prepped and draped in the usual sterile fashion. Following an Esmarch exsanguination of the left lower extremity, the blood pressure tourniquet was inflated to 250 mmHg. Following this, an incision was made on the lateral aspect of the ankle, and the  subcutaneous tissue at the inner level of the fracture site was cleansed of all fracture healing elements. At this point, the ankle was anatomically reduced. Interfragmentary screw was put in place. Excellent fixation was achieved at that point. Then a 6-hole 1/3 tubular plate was then placed in standard A/O fashion. Excellent reduction was achieved, and the mortise was closed down. At that point, the wound was copiously irrigated and suctioned dry. It was then closed in layers. A sterile and compressive dressing was applied as well as a U and posterior splint. The patient was taken to the recovery room where he was noted to be in satisfactory condition. Estimated blood loss through procedure was less than 50 cc. Dictated by:   Harvie Junior, M.D. Attending Physician:  Milly Jakob DD:  03/11/02 TD:  03/12/02 Job: [id number]

## 2018-11-09 ENCOUNTER — Emergency Department (HOSPITAL_COMMUNITY)
Admission: EM | Admit: 2018-11-09 | Discharge: 2018-11-09 | Disposition: A | Discharge status: Home / Self Care | Payer: Self-pay | Attending: Emergency Medicine | Admitting: Emergency Medicine

## 2018-11-09 ENCOUNTER — Other Ambulatory Visit: Payer: Self-pay

## 2018-11-09 ENCOUNTER — Encounter (HOSPITAL_COMMUNITY): Payer: Self-pay | Admitting: Emergency Medicine

## 2018-11-09 ENCOUNTER — Emergency Department (HOSPITAL_COMMUNITY): Payer: Self-pay

## 2018-11-09 DIAGNOSIS — F17210 Nicotine dependence, cigarettes, uncomplicated: Secondary | ICD-10-CM | POA: Insufficient documentation

## 2018-11-09 DIAGNOSIS — R1013 Epigastric pain: Secondary | ICD-10-CM | POA: Insufficient documentation

## 2018-11-09 LAB — CBC WITH DIFFERENTIAL/PLATELET
Abs Immature Granulocytes: 0.04 10*3/uL (ref 0.00–0.07)
Basophils Absolute: 0 10*3/uL (ref 0.0–0.1)
Basophils Relative: 0 %
EOS PCT: 0 %
Eosinophils Absolute: 0 10*3/uL (ref 0.0–0.5)
HEMATOCRIT: 40.5 % (ref 39.0–52.0)
HEMOGLOBIN: 13.3 g/dL (ref 13.0–17.0)
Immature Granulocytes: 1 %
LYMPHS ABS: 1.4 10*3/uL (ref 0.7–4.0)
LYMPHS PCT: 16 %
MCH: 28.9 pg (ref 26.0–34.0)
MCHC: 32.8 g/dL (ref 30.0–36.0)
MCV: 87.9 fL (ref 80.0–100.0)
MONOS PCT: 11 %
Monocytes Absolute: 1 10*3/uL (ref 0.1–1.0)
Neutro Abs: 6.3 10*3/uL (ref 1.7–7.7)
Neutrophils Relative %: 72 %
Platelets: 204 10*3/uL (ref 150–400)
RBC: 4.61 MIL/uL (ref 4.22–5.81)
RDW: 14.7 % (ref 11.5–15.5)
WBC: 8.7 10*3/uL (ref 4.0–10.5)
nRBC: 0 % (ref 0.0–0.2)

## 2018-11-09 LAB — COMPREHENSIVE METABOLIC PANEL
ALBUMIN: 4.2 g/dL (ref 3.5–5.0)
ALK PHOS: 73 U/L (ref 38–126)
ALT: 34 U/L (ref 0–44)
ANION GAP: 12 (ref 5–15)
AST: 57 U/L — ABNORMAL HIGH (ref 15–41)
BILIRUBIN TOTAL: 1.9 mg/dL — AB (ref 0.3–1.2)
BUN: 6 mg/dL (ref 6–20)
CALCIUM: 10 mg/dL (ref 8.9–10.3)
CO2: 26 mmol/L (ref 22–32)
Chloride: 93 mmol/L — ABNORMAL LOW (ref 98–111)
Creatinine, Ser: 0.84 mg/dL (ref 0.61–1.24)
GFR calc Af Amer: >60 mL/min (ref >60)
GFR calc non Af Amer: >60 mL/min (ref >60)
GLUCOSE: 101 mg/dL — AB (ref 70–99)
Potassium: 3.9 mmol/L (ref 3.5–5.1)
Sodium: 131 mmol/L — ABNORMAL LOW (ref 135–145)
TOTAL PROTEIN: 8.6 g/dL — AB (ref 6.5–8.1)

## 2018-11-09 LAB — LIPASE, BLOOD: LIPASE: 73 U/L — AB (ref 11–51)

## 2018-11-09 IMAGING — CT CT ABD-PELV W/ CM
2 of 5 series · 16 of 46 positions shown, 18 images · IV contrast (APPLIED)
Comparison: CT scan of [DATE].

CLINICAL DATA: Upper abdominal and lower back pain.

EXAM:
CT ABDOMEN AND PELVIS WITH CONTRAST
TECHNIQUE: Multidetector CT imaging of the abdomen and pelvis was performed
using the standard protocol following bolus administration of
intravenous contrast.
CONTRAST:  100mL OMNIPAQUE IOHEXOL 300 MG/ML  SOLN

[Series 3: abd/ pelvis 5.0 i30f 2 · axial · 0.76mm/px · z∈[+830,+1230]mm · 13 of 90 slices shown, 15 images]
[im 5/90  soft-tissue]
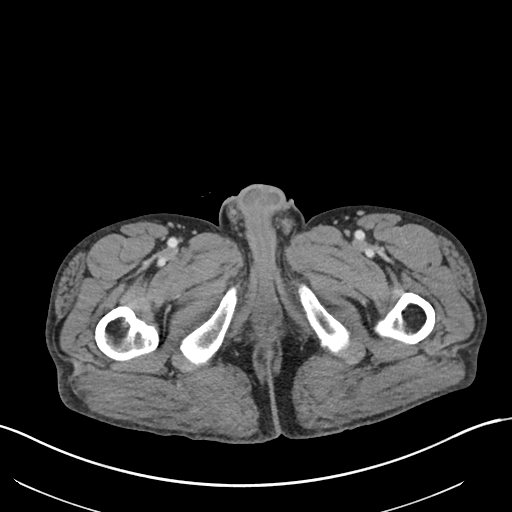
[im 5/90  bone]
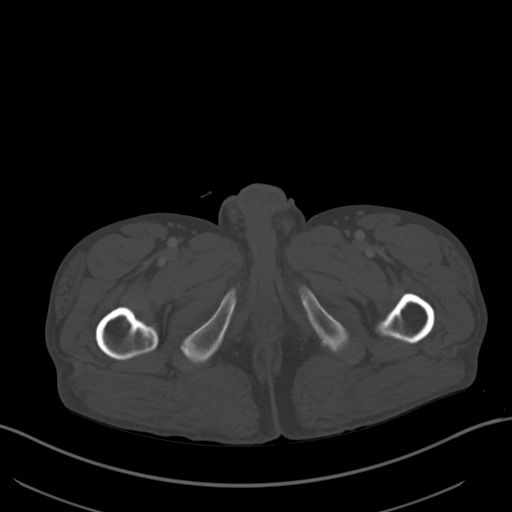
[im 14/90  soft-tissue]
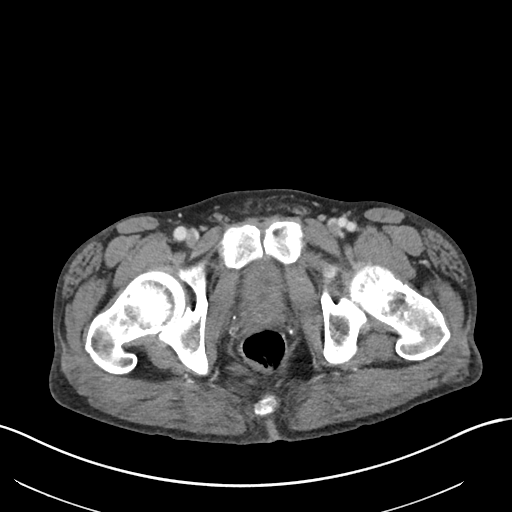
[im 18/90  soft-tissue]
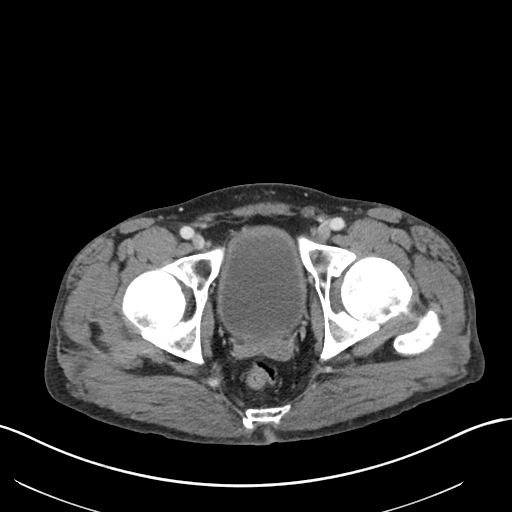
[im 27/90  soft-tissue]
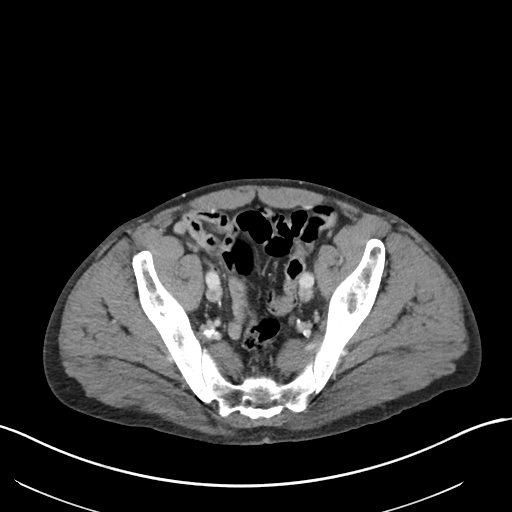
[im 32/90  soft-tissue]
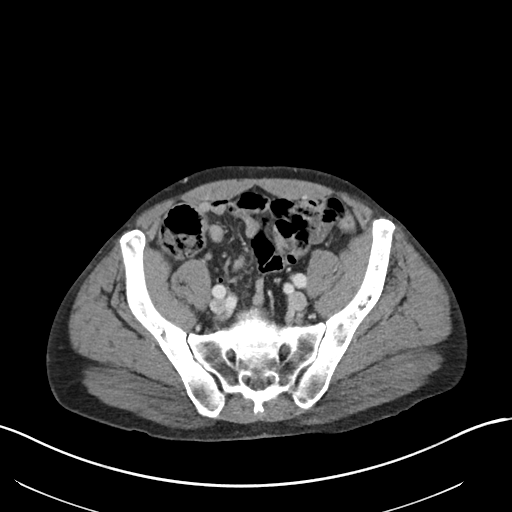
[im 41/90  soft-tissue]
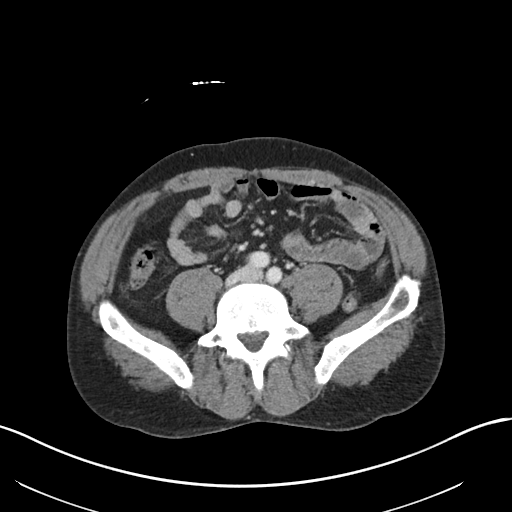
[im 45/90  soft-tissue]
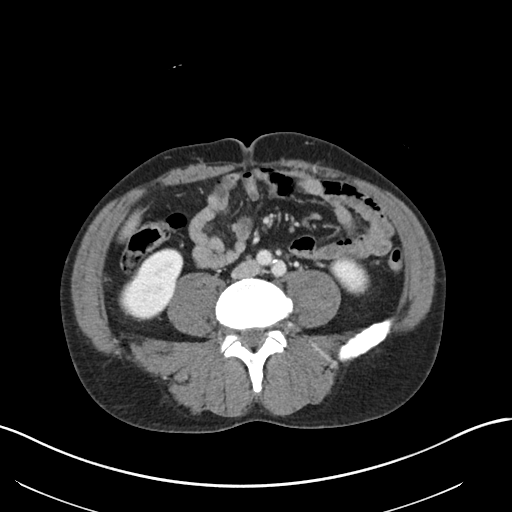
[im 49/90  soft-tissue]
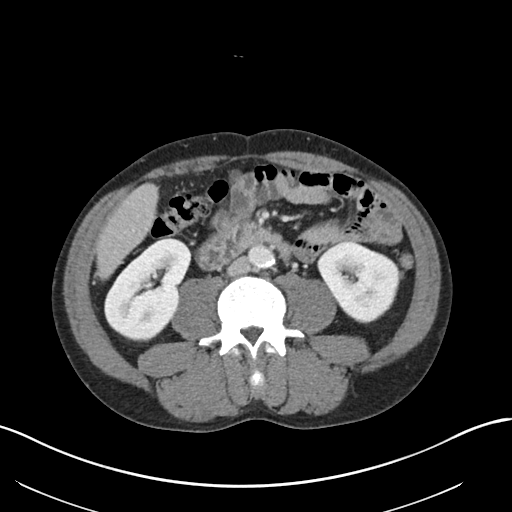
[im 58/90  soft-tissue]
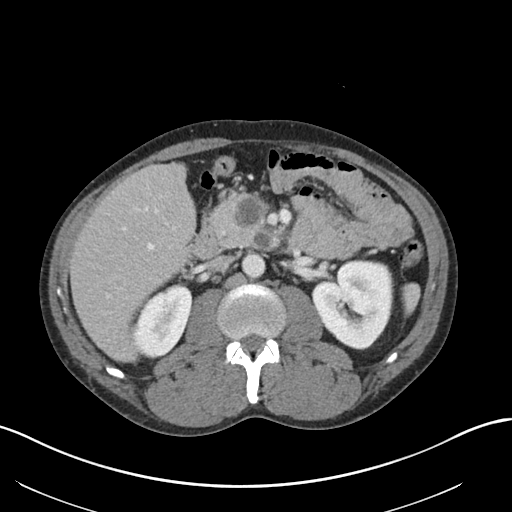
[im 58/90  bone]
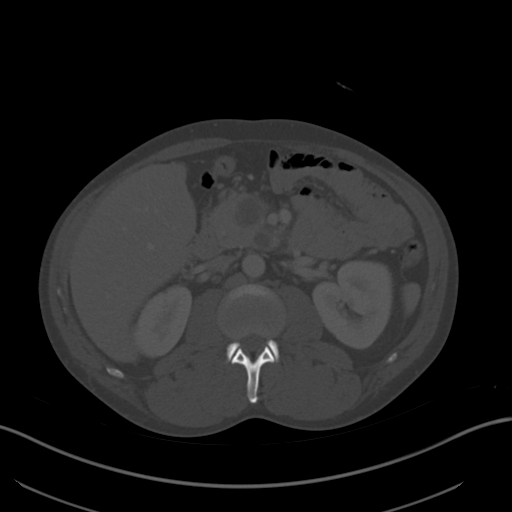
[im 63/90  soft-tissue]
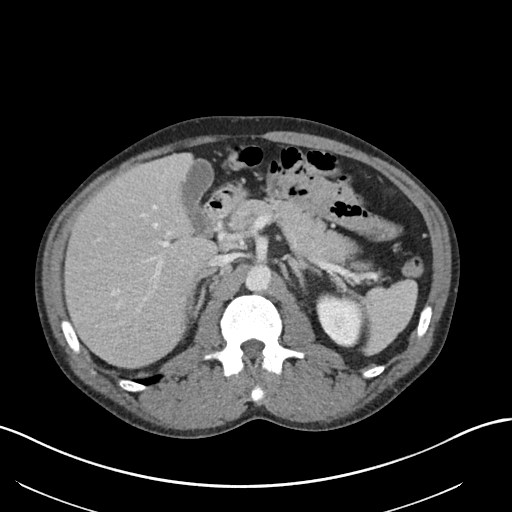
[im 72/90  soft-tissue]
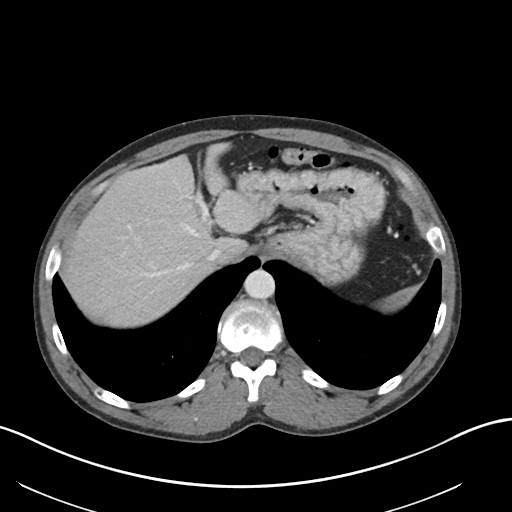
[im 76/90  soft-tissue]
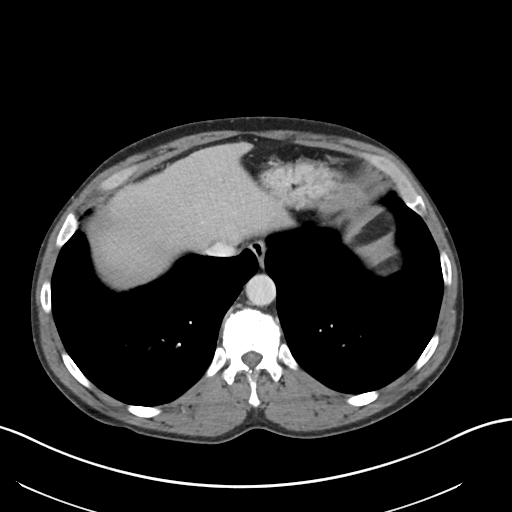
[im 85/90  soft-tissue]
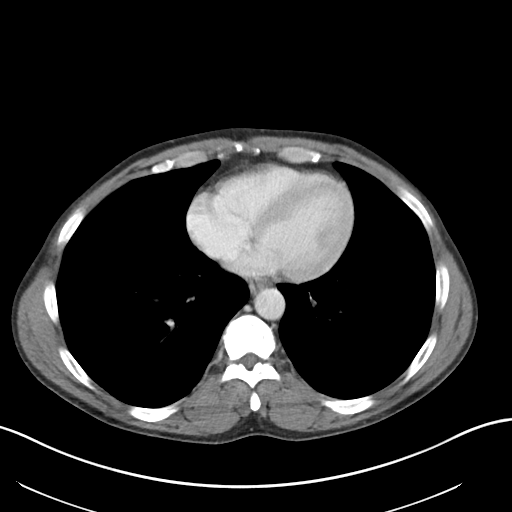

[Series 6: coronal soft tissue · coronal · 0.79mm/px · 3 of 104 slices shown]
[im 35/104  soft-tissue]
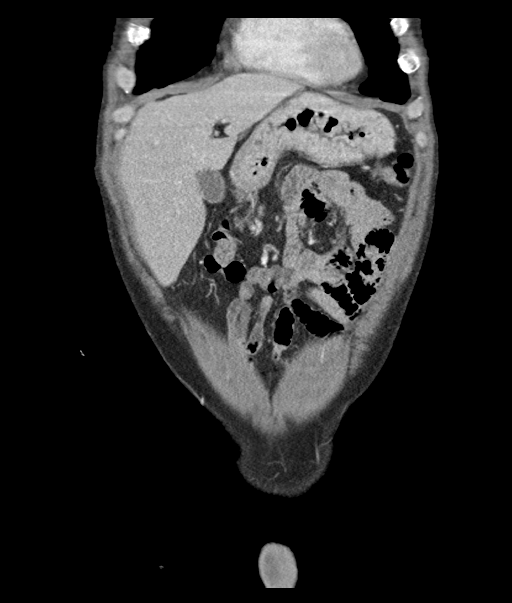
[im 46/104  soft-tissue]
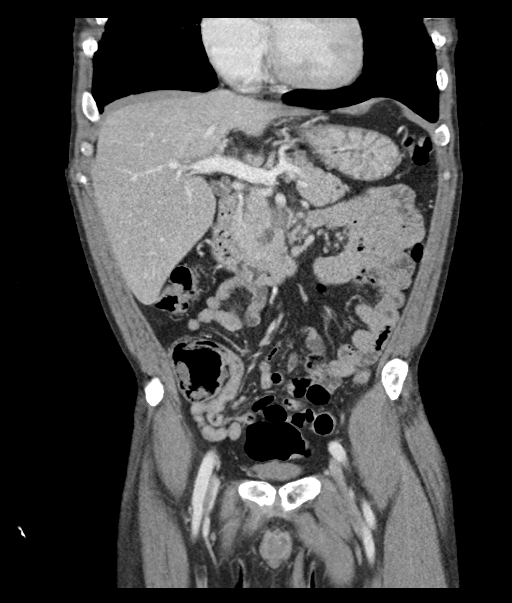
[im 58/104  soft-tissue]
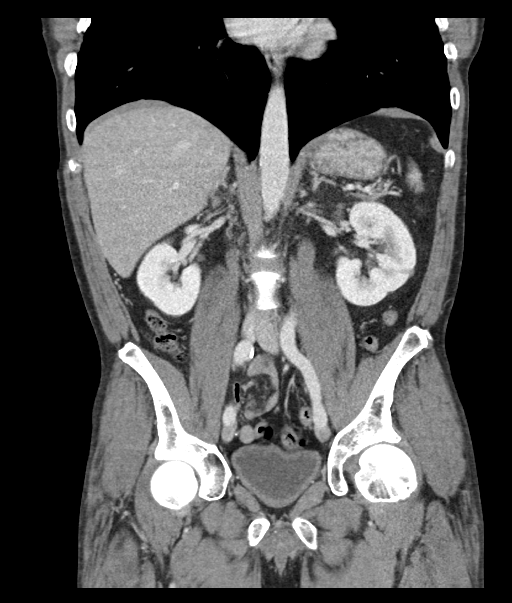

[16 of 46 positions shown; findings below may reference images not displayed]

FINDINGS: Lower chest: No acute abnormality.

Hepatobiliary: No focal liver abnormality is seen. No gallstones,
gallbladder wall thickening, or biliary dilatation.

Pancreas: No inflammation is noted. However, 3.0 x 2.7 cm low
density is seen in pancreatic head which appears to communicate with
dilated duct in uncinate process of pancreatic head.

Spleen: Normal in size without focal abnormality.

Adrenals/Urinary Tract: Adrenal glands are unremarkable. Kidneys are
normal, without renal calculi, focal lesion, or hydronephrosis.
Bladder is unremarkable.

Stomach/Bowel: Stomach is within normal limits. Appendix appears
normal. No evidence of bowel wall thickening, distention, or
inflammatory changes.

Vascular/Lymphatic: Aortic atherosclerosis. No enlarged abdominal or
pelvic lymph nodes.

Reproductive: Prostate is unremarkable.

Other: No abdominal wall hernia or abnormality. No abdominopelvic
ascites.

Musculoskeletal: No acute or significant osseous findings.
IMPRESSION: 3 cm low density is seen in pancreatic head which was not present on
prior exam, and appears to communicate with dilated duct in uncinate
process of pancreatic head. Etiology is unknown but most likely
benign. However, further evaluation with MRI with and without
gadolinium is recommend to rule out other pathology.

No other acute abnormality seen in the abdomen or pelvis.

Aortic Atherosclerosis ([FD]-[FD]).

## 2018-11-09 MED ORDER — SODIUM CHLORIDE 0.9 % IV SOLN
INTRAVENOUS | Status: DC
  Administered 2018-11-09: 11:00:00 via INTRAVENOUS

## 2018-11-09 MED ORDER — ONDANSETRON HCL 4 MG/2ML IJ SOLN
4.0000 mg | Freq: Once | INTRAMUSCULAR | Status: AC
  Administered 2018-11-09: 4 mg via INTRAVENOUS
  Filled 2018-11-09: qty 2

## 2018-11-09 MED ORDER — IOHEXOL 300 MG/ML  SOLN
100.0000 mL | Freq: Once | INTRAMUSCULAR | Status: AC | PRN
  Administered 2018-11-09: 100 mL via INTRAVENOUS

## 2018-11-09 NOTE — ED Provider Notes (Signed)
[facility] EMERGENCY DEPARTMENT Provider Note   CSN: [id number] Arrival date & time: 11/09/18  1062     History   Chief Complaint Chief Complaint  Patient presents with  . Back Pain  . Abdominal Pain    HPI [name] is a 43 y.o. male.  Patient with down with a complaint of abdominal pain for about 2 weeks.  Maybe a little bit longer.  Pain was initially epigastric now little bit more periumbilical area.  No nausea vomiting or diarrhea no fevers.  No prior history of similar pain.  Patient admits to a little bit of alcohol intake but not excessive.     History reviewed. No pertinent past medical history.  There are no active problems to display for this patient.   Past Surgical History:  Procedure Laterality Date  . FRACTURE SURGERY          Home Medications    Prior to Admission medications   Not on File    Family History No family history on file.  Social History Social History   Tobacco Use  . Smoking status: Current Every Day Smoker    Types: Cigarettes  . Smokeless tobacco: Never Used  Substance Use Topics  . Alcohol use: Yes  . Drug use: Yes    Types: Marijuana     Allergies   Patient has no known allergies.   Review of Systems Review of Systems  Constitutional: Negative for chills and fever.  HENT: Negative for rhinorrhea and sore throat.   Eyes: Negative for visual disturbance.  Respiratory: Negative for cough and shortness of breath.   Cardiovascular: Negative for chest pain and leg swelling.  Gastrointestinal: Positive for abdominal pain. Negative for diarrhea, nausea and vomiting.  Genitourinary: Negative for dysuria.  Musculoskeletal: Positive for back pain. Negative for neck pain.  Skin: Negative for rash.  Neurological: Negative for dizziness, light-headedness and headaches.  Hematological: Does not bruise/bleed easily.  Psychiatric/Behavioral: Negative for confusion.     Physical Exam Updated  Vital Signs BP (!) 151/102 (BP Location: Left Arm)   Pulse 72   Temp 98.4 F (36.9 C) (Oral)   Resp 16   Ht 1.753 m (5\' 9" )   Wt 77.1 kg   SpO2 100%   BMI 25.10 kg/m   Physical Exam Vitals signs and nursing note reviewed.  Constitutional:      Appearance: He is well-developed.  HENT:     Head: Normocephalic and atraumatic.     Mouth/Throat:     Mouth: Mucous membranes are moist.  Eyes:     Conjunctiva/sclera: Conjunctivae normal.  Neck:     Musculoskeletal: Neck supple.  Cardiovascular:     Rate and Rhythm: Normal rate and regular rhythm.     Heart sounds: No murmur.  Pulmonary:     Effort: Pulmonary effort is normal. No respiratory distress.     Breath sounds: Normal breath sounds.  Abdominal:     Palpations: Abdomen is soft.     Tenderness: There is no abdominal tenderness.  Skin:    General: Skin is warm and dry.     Capillary Refill: Capillary refill takes less than 2 seconds.  Neurological:     General: No focal deficit present.     Mental Status: He is alert and oriented to person, place, and time.      ED Treatments / Results  Labs (all labs ordered are listed, but only abnormal results are displayed) Labs Reviewed  COMPREHENSIVE METABOLIC  PANEL - Abnormal; Notable for the following components:      Result Value   Sodium 131 (*)    Chloride 93 (*)    Glucose, Bld 101 (*)    Total Protein 8.6 (*)    AST 57 (*)    Total Bilirubin 1.9 (*)    All other components within normal limits  LIPASE, BLOOD - Abnormal; Notable for the following components:   Lipase 73 (*)    All other components within normal limits  CBC WITH DIFFERENTIAL/PLATELET    EKG None  Radiology Ct Abdomen Pelvis W Contrast  Result Date: 11/09/2018 CLINICAL DATA:  Upper abdominal and lower back pain. EXAM: CT ABDOMEN AND PELVIS WITH CONTRAST TECHNIQUE: Multidetector CT imaging of the abdomen and pelvis was performed using the standard protocol following bolus administration of  intravenous contrast. CONTRAST:  160mL OMNIPAQUE IOHEXOL 300 MG/ML  SOLN COMPARISON:  CT scan of June 23, 2008. FINDINGS: Lower chest: No acute abnormality. Hepatobiliary: No focal liver abnormality is seen. No gallstones, gallbladder Tews thickening, or biliary dilatation. Pancreas: No inflammation is noted. However, 3.0 x 2.7 cm low density is seen in pancreatic head which appears to communicate with dilated duct in uncinate process of pancreatic head. Spleen: Normal in size without focal abnormality. Adrenals/Urinary Tract: Adrenal glands are unremarkable. Kidneys are normal, without renal calculi, focal lesion, or hydronephrosis. Bladder is unremarkable. Stomach/Bowel: Stomach is within normal limits. Appendix appears normal. No evidence of bowel Mckibbin thickening, distention, or inflammatory changes. Vascular/Lymphatic: Aortic atherosclerosis. No enlarged abdominal or pelvic lymph nodes. Reproductive: Prostate is unremarkable. Other: No abdominal Samudio hernia or abnormality. No abdominopelvic ascites. Musculoskeletal: No acute or significant osseous findings. IMPRESSION: 3 cm low density is seen in pancreatic head which was not present on prior exam, and appears to communicate with dilated duct in uncinate process of pancreatic head. Etiology is unknown but most likely benign. However, further evaluation with MRI with and without gadolinium is recommend to rule out other pathology. No other acute abnormality seen in the abdomen or pelvis. Aortic Atherosclerosis (ICD10-I70.0). Electronically Signed   By: Marijo Conception, M.D.   On: 11/09/2018 13:31    Procedures Procedures (including critical care time)  Medications Ordered in ED Medications  0.9 %  sodium chloride infusion ( Intravenous New Bag/Given 11/09/18 1128)  ondansetron (ZOFRAN) injection 4 mg (4 mg Intravenous Given 11/09/18 1129)  iohexol (OMNIPAQUE) 300 MG/ML solution 100 mL (100 mLs Intravenous Contrast Given 11/09/18 1251)     Initial  Impression / Assessment and Plan / ED Course  I have reviewed the triage vital signs and the nursing notes.  Pertinent labs & imaging results that were available during my care of the patient were reviewed by me and considered in my medical decision making (see chart for details).     Patient with some vague abdominal pain over the past 2 weeks.  Initially epigastric now a little bit more in the periumbilical area.  Patient admits to some alcohol intake.  But states is not excessive.  CT showed concerns for a pancreatic mass which on initial CT looks as it may be benign.  But patient's lipase is up a little bit.  Patient nontoxic no acute distress no sign of abdominal pain.  Patient stable for discharge home and follow-up with GI medicine.  May need MR I of the pancreatic area.  Patient understands the concern and that we want to make sure that this is not consistent with a pancreatic mass.  Patient will avoid alcohol in the meantime.  He will return for any new or worse symptoms.  Final Clinical Impressions(s) / ED Diagnoses   Final diagnoses:  Epigastric pain    ED Discharge Orders    None       Fredia Sorrow, MD 11/22/18 1616

## 2018-11-09 NOTE — ED Notes (Signed)
Patient transported to CT 

## 2018-11-09 NOTE — Discharge Instructions (Addendum)
Call gastroenterology for follow-up of the pancreatic mass.  Return for any new or worse symptoms.  Avoid alcohol.

## 2018-11-09 NOTE — ED Triage Notes (Signed)
Pt reports lower back and upper mid abd pain described as a knot x1 month.

## 2020-07-16 ENCOUNTER — Other Ambulatory Visit: Payer: Self-pay

## 2020-07-16 ENCOUNTER — Emergency Department (HOSPITAL_COMMUNITY)
Admission: EM | Admit: 2020-07-16 | Discharge: 2020-07-17 | Disposition: A | Discharge status: Home / Self Care | Payer: Self-pay | Attending: Emergency Medicine | Admitting: Emergency Medicine

## 2020-07-16 DIAGNOSIS — F1590 Other stimulant use, unspecified, uncomplicated: Secondary | ICD-10-CM | POA: Insufficient documentation

## 2020-07-16 DIAGNOSIS — F17210 Nicotine dependence, cigarettes, uncomplicated: Secondary | ICD-10-CM | POA: Insufficient documentation

## 2020-07-16 DIAGNOSIS — K0889 Other specified disorders of teeth and supporting structures: Secondary | ICD-10-CM | POA: Insufficient documentation

## 2020-07-16 NOTE — ED Triage Notes (Signed)
C/o sores in mouth

## 2020-07-17 ENCOUNTER — Encounter (HOSPITAL_COMMUNITY): Payer: Self-pay | Admitting: Student

## 2020-07-17 MED ORDER — LIDOCAINE VISCOUS HCL 2 % MT SOLN
15.0000 mL | Freq: Once | OROMUCOSAL | Status: AC
  Administered 2020-07-17: 15 mL via OROMUCOSAL
  Filled 2020-07-17: qty 15

## 2020-07-17 MED ORDER — CHLORHEXIDINE GLUCONATE 0.12 % MT SOLN: 15.0000 mL | Freq: Two times a day (BID) | OROMUCOSAL | 0 refills | Status: DC

## 2020-07-17 MED ORDER — NAPROXEN 500 MG PO TABS: 500.0000 mg | ORAL_TABLET | Freq: Two times a day (BID) | ORAL | 0 refills | Status: DC | PRN

## 2020-07-17 MED ORDER — AMOXICILLIN-POT CLAVULANATE 875-125 MG PO TABS: 1.0000 | ORAL_TABLET | Freq: Two times a day (BID) | ORAL | 0 refills | Status: DC

## 2020-07-17 MED ORDER — AMOXICILLIN-POT CLAVULANATE 875-125 MG PO TABS
1.0000 | ORAL_TABLET | Freq: Once | ORAL | Status: AC
  Administered 2020-07-17: 1 via ORAL
  Filled 2020-07-17: qty 1

## 2020-07-17 NOTE — ED Provider Notes (Signed)
[facility] DEPARTMENT Provider Note   CSN: [id number] Arrival date & time: 07/16/20  1533     History Chief Complaint  Patient presents with  . Dental Pain    [name] is a 44 y.o. male with a history of tobacco abuse who presents to the ED with complaints of dental pain x 1-2 weeks. Patient states he is having pain to the L upper/lower posterior molar that is constant, worse with chewing, alleviated mildly by OTC NSAIDs. Teeth have started to irritate the L side of his tongue. He denies fever, chills, dysphagia, neck swelling, pain/swelling beneath the tongue, vomiting, or dyspnea.   HPI     History reviewed. No pertinent past medical history.  There are no problems to display for this patient.   Past Surgical History:  Procedure Laterality Date  . FRACTURE SURGERY         History reviewed. No pertinent family history.  Social History   Tobacco Use  . Smoking status: Current Every Day Smoker    Types: Cigarettes  . Smokeless tobacco: Never Used  Substance Use Topics  . Alcohol use: Yes  . Drug use: Yes    Types: Marijuana    Home Medications Prior to Admission medications   Not on File    Allergies    Patient has no known allergies.  Review of Systems   Review of Systems  Constitutional: Negative for chills and fever.  HENT: Positive for dental problem.   Respiratory: Negative for shortness of breath.   Cardiovascular: Negative for chest pain.  Gastrointestinal: Negative for abdominal pain and vomiting.  Musculoskeletal: Negative for neck pain and neck stiffness.  Neurological: Negative for syncope.    Physical Exam Updated Vital Signs BP (!) 174/100   Pulse 75   Temp 99.2 F (37.3 C) (Oral)   Resp 18   SpO2 100%   Physical Exam Vitals and nursing note reviewed.  Constitutional:      General: He is not in acute distress.    Appearance: He is well-developed. He is not toxic-appearing.  HENT:     Head:  Normocephalic and atraumatic.     Right Ear: Tympanic membrane is not perforated, erythematous, retracted or bulging.     Left Ear: Tympanic membrane is not perforated, erythematous, retracted or bulging.     Nose: Nose normal.     Mouth/Throat:     Pharynx: Uvula midline. No oropharyngeal exudate or posterior oropharyngeal erythema.      Comments: L upper and lower most posterior molar teeth with some decay, surrounding gingival erythema/swelling present. Tender to palpation in these areas. No palpable fluctuance. No gross abscess.   Posterior oropharynx is symmetric appearing. Patient tolerating own secretions without difficulty. No trismus. No drooling. No hot potato voice. No swelling beneath the tongue, submandibular compartment is soft.  Eyes:     General:        Right eye: No discharge.        Left eye: No discharge.     Conjunctiva/sclera: Conjunctivae normal.  Cardiovascular:     Rate and Rhythm: Normal rate and regular rhythm.  Pulmonary:     Effort: Pulmonary effort is normal.     Breath sounds: Normal breath sounds.  Musculoskeletal:     Cervical back: Normal range of motion and neck supple. No edema, erythema, rigidity or crepitus. Normal range of motion.  Lymphadenopathy:     Cervical: No cervical adenopathy.  Neurological:     Mental  Status: He is alert.  Psychiatric:        Behavior: Behavior normal.        Thought Content: Thought content normal.     ED Results / Procedures / Treatments   Labs (all labs ordered are listed, but only abnormal results are displayed) Labs Reviewed - No data to display  EKG None  Radiology No results found.  Procedures Procedures (including critical care time)  Medications Ordered in ED Medications  lidocaine (XYLOCAINE) 2 % viscous mouth solution 15 mL (has no administration in time range)  amoxicillin-clavulanate (AUGMENTIN) 875-125 MG per tablet 1 tablet (has no administration in time range)    ED Course  I have  reviewed the triage vital signs and the nursing notes.  Pertinent labs & imaging results that were available during my care of the patient were reviewed by me and considered in my medical decision making (see chart for details).    MDM Rules/Calculators/A&P                         Patient presents with dental pain, concern for infection, likely some irritation to lateral tongue associated with this. Patient is nontoxic appearing, vitals w/ elevated BP-doubt HTN emergency- discussed need for recheck w/ patient. No gross abscess.  Exam unconcerning for Ludwig's angina or deep space infection.  Will treat with Augmentin and Naproxen, will also provide peridex mouth wash. Urged patient to follow-up with dentist, dental resources were provided.  Discussed treatment plan and need for follow up as well as return precautions. Provided opportunity for questions, patient confirmed understanding and is agreeable to plan.  Final Clinical Impression(s) / ED Diagnoses Final diagnoses:  Pain, dental    Rx / DC Orders ED Discharge Orders         Ordered    amoxicillin-clavulanate (AUGMENTIN) 875-125 MG tablet  Every 12 hours        07/17/20 0252    naproxen (NAPROSYN) 500 MG tablet  2 times daily PRN        07/17/20 0252    chlorhexidine (PERIDEX) 0.12 % solution  2 times daily        07/17/20 0252           Skanda Worlds, Glynda Jaeger, PA-C 07/17/20 0255    Orpah Greek, MD 07/17/20 [id number]

## 2020-07-17 NOTE — Discharge Instructions (Signed)
Call one of the dentists offices provided to schedule an appointment for re-evaluation and further management within the next 48 hours.   I have prescribed you Augmentin which is an antibiotic to treat the infection and Naproxen which is an anti-inflammatory medicine to treat the pain.   Please take all of your antibiotics until finished. You may develop abdominal discomfort or diarrhea from the antibiotic.  You may help offset this with probiotics which you can buy at the store (ask your pharmacist if unable to find) or get probiotics in the form of eating yogurt. Do not eat or take the probiotics until 2 hours after your antibiotic. If you are unable to tolerate these side effects follow-up with your primary care provider or return to the emergency department.   If you begin to experience any blistering, rashes, swelling, or difficulty breathing seek medical care for evaluation of potentially more serious side effects.   Be sure to eat something when taking the Naproxen as it can cause stomach upset and at worst stomach bleeding. Do not take additional non steroidal anti-inflammatory medicines such as Ibuprofen, Aleve, Advil, Mobic, Diclofenac, or goodie powder while taking Naproxen. You may supplement with Tylenol.   WE have also prescribed you peridex which is an antibacterial mouth wash, please use 2 times per day.   We have prescribed you new medication(s) today. Discuss the medications prescribed today with your pharmacist as they can have adverse effects and interactions with your other medicines including over the counter and prescribed medications. Seek medical evaluation if you start to experience new or abnormal symptoms after taking one of these medicines, seek care immediately if you start to experience difficulty breathing, feeling of your throat closing, facial swelling, or rash as these could be indications of a more serious allergic reaction  If you start to experience and new or  worsening symptoms return to the emergency department. If you start to experience fever, chills, neck stiffness/pain, or inability to move your neck or open your mouth come back to the emergency department immediately.

## 2020-09-05 ENCOUNTER — Other Ambulatory Visit: Payer: Self-pay

## 2020-09-05 ENCOUNTER — Ambulatory Visit (HOSPITAL_COMMUNITY)
Admission: EM | Admit: 2020-09-05 | Discharge: 2020-09-05 | Disposition: A | Discharge status: Home / Self Care | Payer: Medicaid Other | Attending: Emergency Medicine | Admitting: Emergency Medicine

## 2020-09-05 ENCOUNTER — Encounter (HOSPITAL_COMMUNITY)
Admission: EM | Disposition: A | Discharge status: Home / Self Care | Payer: Self-pay | Source: Home / Self Care | Attending: Emergency Medicine

## 2020-09-05 ENCOUNTER — Emergency Department (HOSPITAL_COMMUNITY): Payer: Medicaid Other

## 2020-09-05 ENCOUNTER — Emergency Department (HOSPITAL_COMMUNITY): Payer: Medicaid Other | Admitting: Anesthesiology

## 2020-09-05 ENCOUNTER — Encounter (HOSPITAL_COMMUNITY): Payer: Self-pay | Admitting: Emergency Medicine

## 2020-09-05 DIAGNOSIS — Z20822 Contact with and (suspected) exposure to covid-19: Secondary | ICD-10-CM | POA: Diagnosis not present

## 2020-09-05 DIAGNOSIS — F1290 Cannabis use, unspecified, uncomplicated: Secondary | ICD-10-CM | POA: Diagnosis not present

## 2020-09-05 DIAGNOSIS — C029 Malignant neoplasm of tongue, unspecified: Secondary | ICD-10-CM | POA: Diagnosis not present

## 2020-09-05 DIAGNOSIS — J439 Emphysema, unspecified: Secondary | ICD-10-CM | POA: Diagnosis not present

## 2020-09-05 DIAGNOSIS — I700 Atherosclerosis of aorta: Secondary | ICD-10-CM | POA: Insufficient documentation

## 2020-09-05 DIAGNOSIS — F17200 Nicotine dependence, unspecified, uncomplicated: Secondary | ICD-10-CM | POA: Diagnosis not present

## 2020-09-05 HISTORY — PX: DIRECT LARYNGOSCOPY: SHX5326

## 2020-09-05 HISTORY — PX: ESOPHAGOSCOPY: SHX5534

## 2020-09-05 HISTORY — PX: TONGUE BIOPSY: SHX6575

## 2020-09-05 LAB — CBC WITH DIFFERENTIAL/PLATELET
Abs Immature Granulocytes: 0.02 10*3/uL (ref 0.00–0.07)
Basophils Absolute: 0 10*3/uL (ref 0.0–0.1)
Basophils Relative: 0 %
Eosinophils Absolute: 0 10*3/uL (ref 0.0–0.5)
Eosinophils Relative: 0 %
HCT: 41.3 % (ref 39.0–52.0)
Hemoglobin: 13.4 g/dL (ref 13.0–17.0)
Immature Granulocytes: 0 %
Lymphocytes Relative: 23 %
Lymphs Abs: 1.8 10*3/uL (ref 0.7–4.0)
MCH: 29.8 pg (ref 26.0–34.0)
MCHC: 32.4 g/dL (ref 30.0–36.0)
MCV: 91.8 fL (ref 80.0–100.0)
Monocytes Absolute: 0.6 10*3/uL (ref 0.1–1.0)
Monocytes Relative: 7 %
Neutro Abs: 5.5 10*3/uL (ref 1.7–7.7)
Neutrophils Relative %: 70 %
Platelets: 241 10*3/uL (ref 150–400)
RBC: 4.5 MIL/uL (ref 4.22–5.81)
RDW: 12.3 % (ref 11.5–15.5)
WBC: 8 10*3/uL (ref 4.0–10.5)
nRBC: 0 % (ref 0.0–0.2)

## 2020-09-05 LAB — BASIC METABOLIC PANEL
Anion gap: 10 (ref 5–15)
BUN: 7 mg/dL (ref 6–20)
CO2: 26 mmol/L (ref 22–32)
Calcium: 9.6 mg/dL (ref 8.9–10.3)
Chloride: 98 mmol/L (ref 98–111)
Creatinine, Ser: 0.77 mg/dL (ref 0.61–1.24)
GFR, Estimated: >60 mL/min (ref >60)
Glucose, Bld: 108 mg/dL — ABNORMAL HIGH (ref 70–99)
Potassium: 3.4 mmol/L — ABNORMAL LOW (ref 3.5–5.1)
Sodium: 134 mmol/L — ABNORMAL LOW (ref 135–145)

## 2020-09-05 LAB — RESPIRATORY PANEL BY RT PCR (FLU A&B, COVID)
Influenza A by PCR: NEGATIVE
Influenza B by PCR: NEGATIVE
SARS Coronavirus 2 by RT PCR: NEGATIVE

## 2020-09-05 IMAGING — CT CT CHEST W/ CM
2 of 4 series · 15 of 36 positions shown, 18 images · IV contrast (APPLIED)
Comparison: Neck CT of the same date. CT of the chest from [9M] as
an additional comparison

CLINICAL DATA: Head neck cancer staging, staging for new tongue
cancer. 43-year-old male

EXAM:
CT CHEST WITH CONTRAST
TECHNIQUE: Multidetector CT imaging of the chest was performed during
intravenous contrast administration.
CONTRAST:  75mL OMNIPAQUE IOHEXOL 300 MG/ML  SOLN

[Series 3: chest w · axial · 0.63mm/px · z∈[+1303,+1581]mm · 12 of 165 slices shown, 15 images]
[im 13/165  mediastinal]
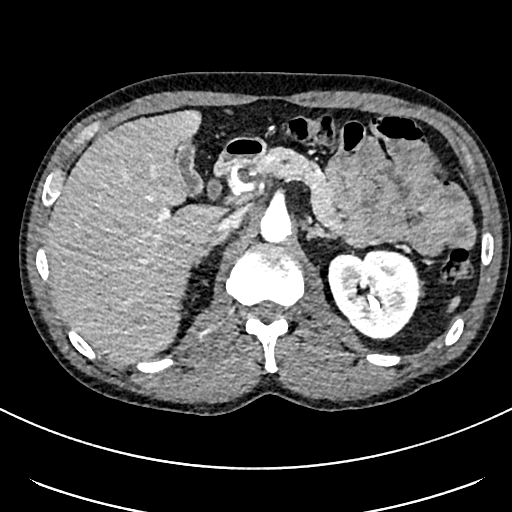
[im 13/165  lung]
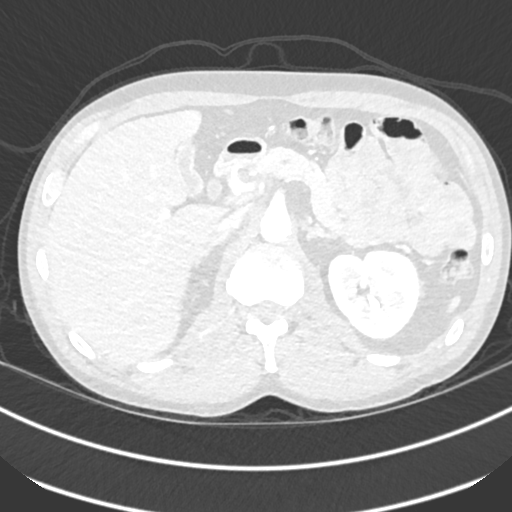
[im 26/165  lung]
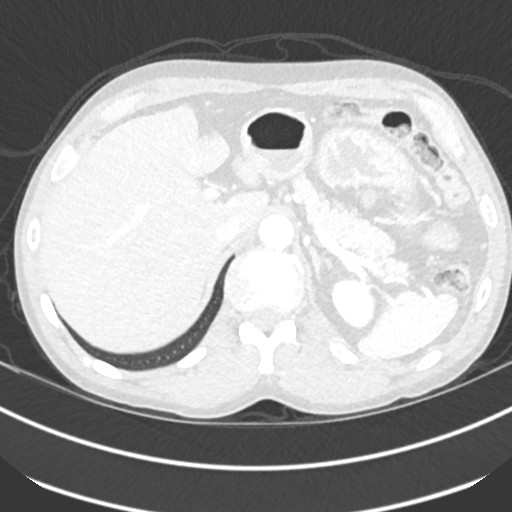
[im 38/165  lung]
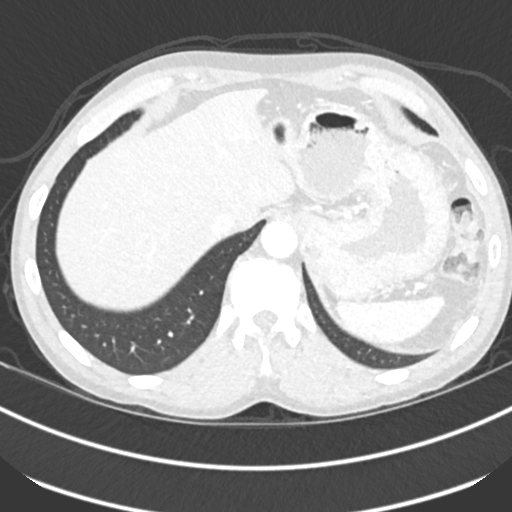
[im 51/165  lung]
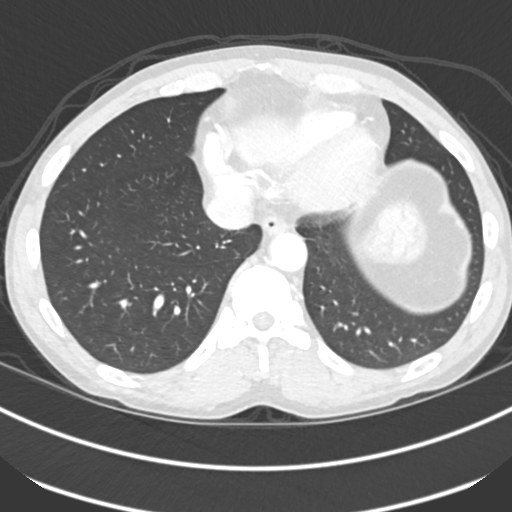
[im 64/165  mediastinal]
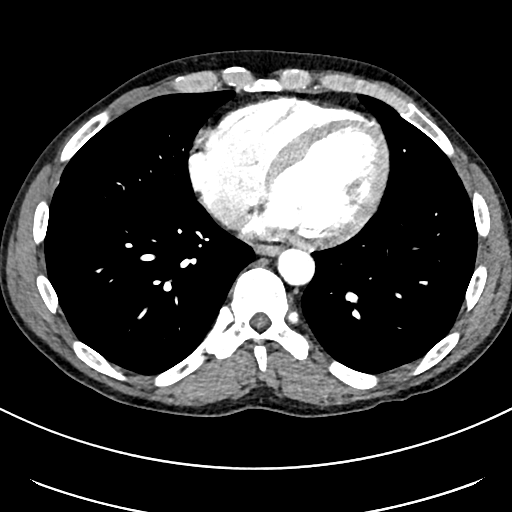
[im 64/165  lung]
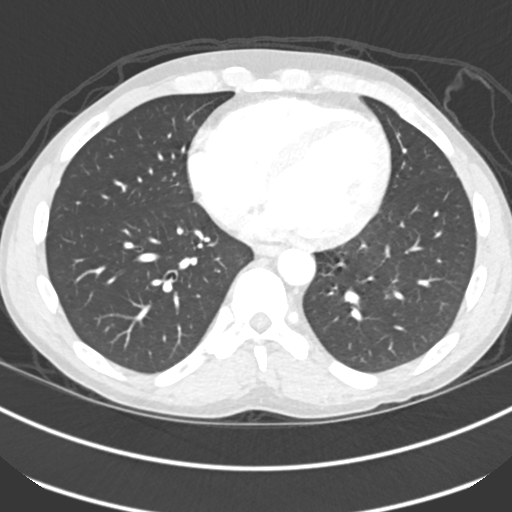
[im 76/165  lung]
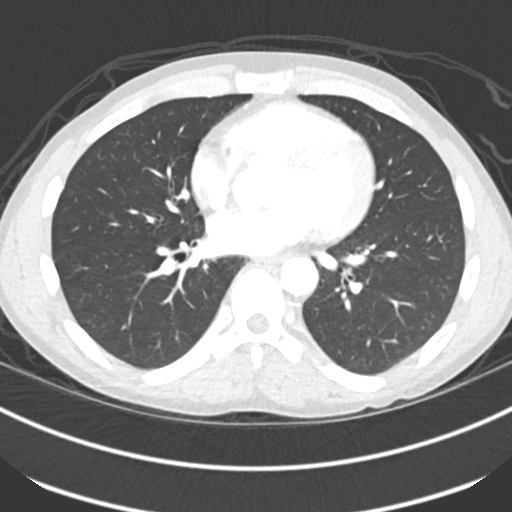
[im 89/165  lung]
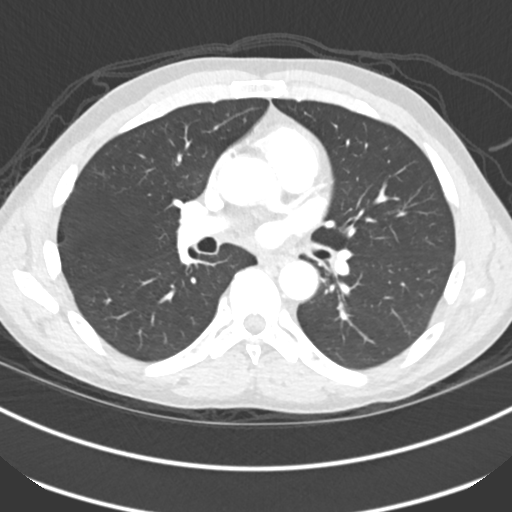
[im 101/165  lung]
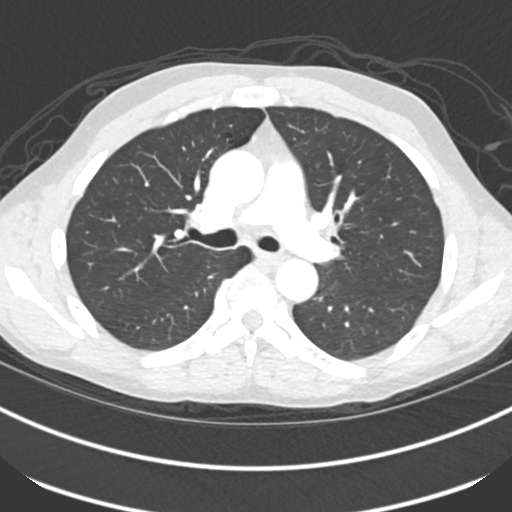
[im 114/165  mediastinal]
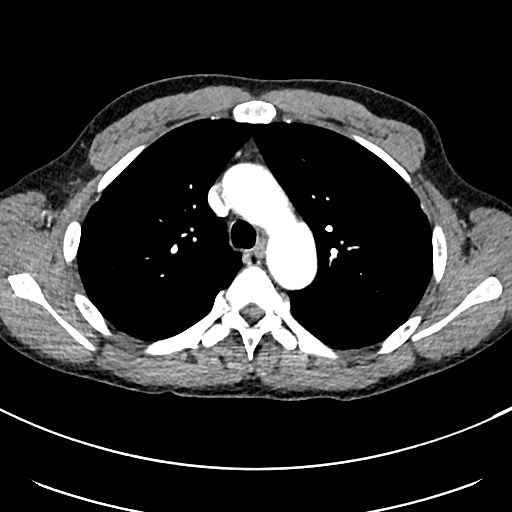
[im 114/165  lung]
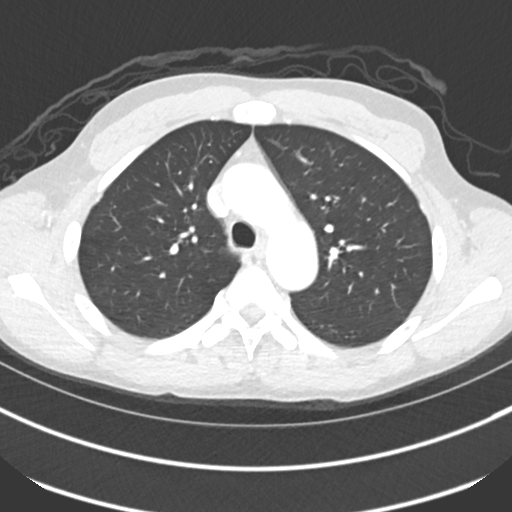
[im 127/165  lung]
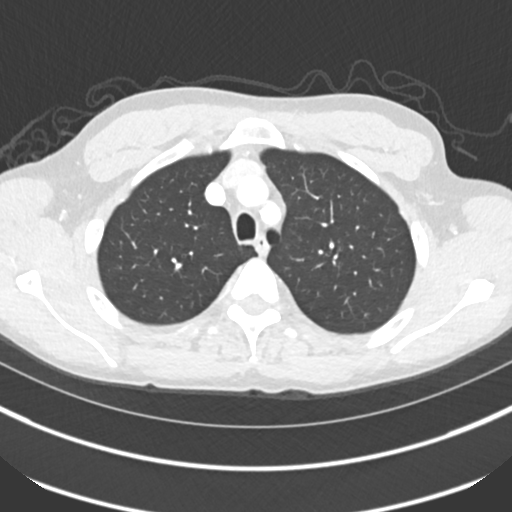
[im 139/165  lung]
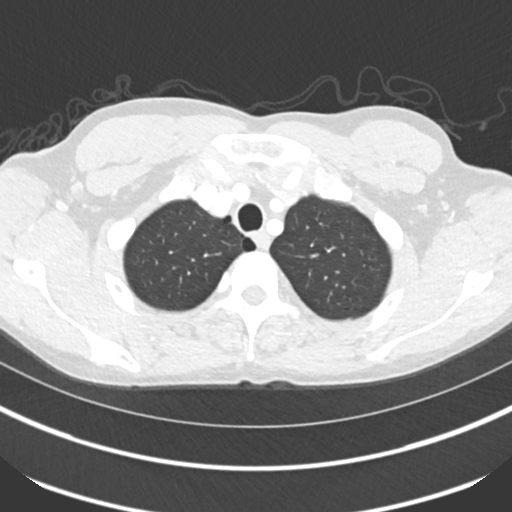
[im 152/165  lung]
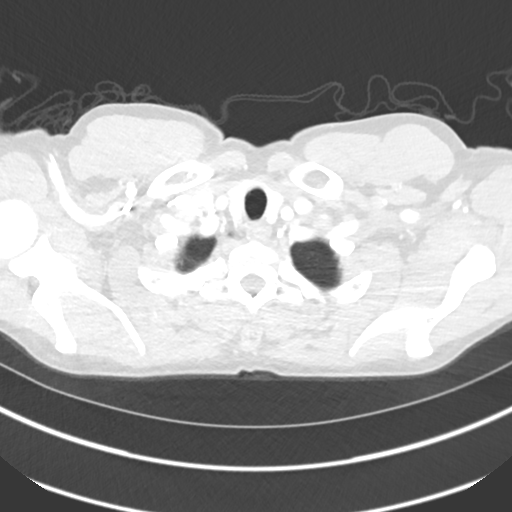

[Series 6: cor · coronal · 0.66mm/px · 3 of 107 slices shown]
[im 22/107  lung]
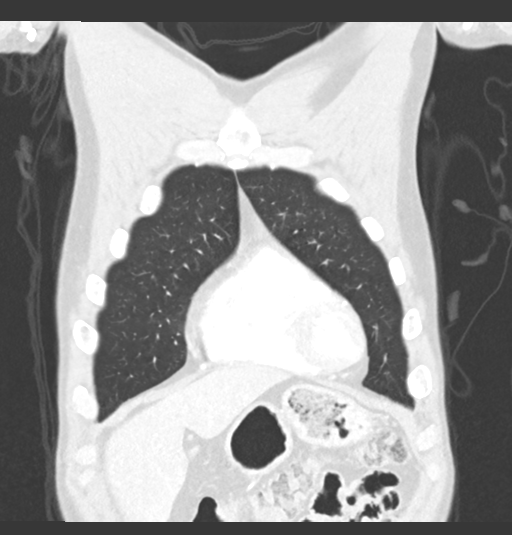
[im 43/107  lung]
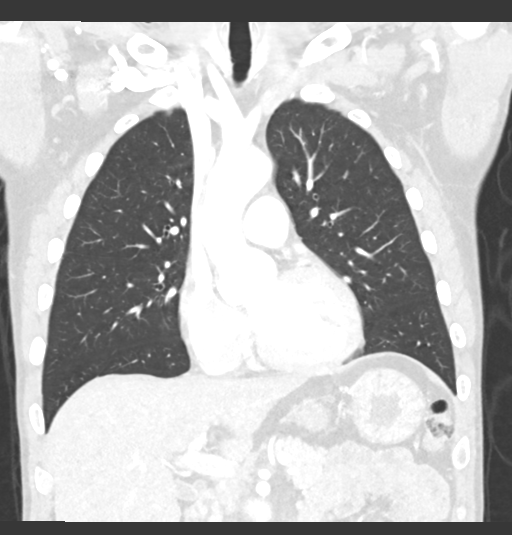
[im 64/107  lung]
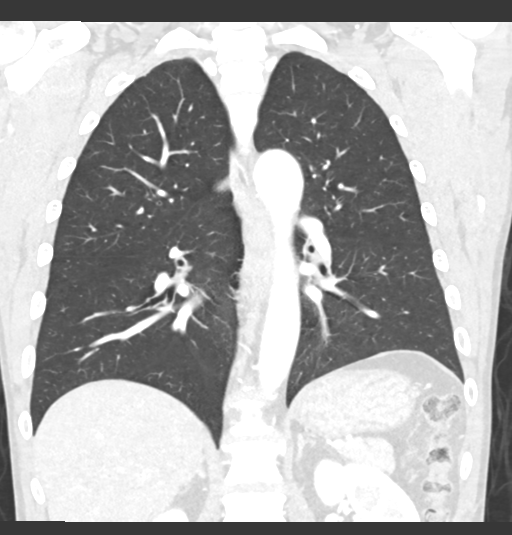

[15 of 36 positions shown; findings below may reference images not displayed]

FINDINGS: Cardiovascular: Calcified atheromatous plaque of the thoracic aorta
is mild. No associated aneurysmal dilation. Central pulmonary
vasculature is normal on venous phase assessment. Heart size is
normal without pericardial effusion.

Mediastinum/Nodes: Esophagus grossly normal. No thoracic inlet
adenopathy. See neck CT for further detail. No axillary
lymphadenopathy. No mediastinal adenopathy. No hilar adenopathy.

Lungs/Pleura: Pulmonary emphysema, paraseptal and mild of the lung
apices. No consolidation. No pleural effusion. Airways are patent.

Upper Abdomen: No pericholecystic stranding. w imaged portions the
liver are normal. No visible biliary duct distension. Imaged
portions of pancreas and spleen are normal. Adrenal glands are
normal.

No acute process in the upper abdomen. Excreted contrast partially
visualized within the urinary tract.

Musculoskeletal: No acute musculoskeletal process. Spinal
degenerative changes.
IMPRESSION: 1. No evidence of metastatic disease to the chest.
2. Pulmonary emphysema and aortic atherosclerosis.

Aortic Atherosclerosis ([9M]-[9M]) and Emphysema ([9M]-[9M]).

## 2020-09-05 IMAGING — CT CT NECK W/ CM
3 of 5 series · 13 of 35 positions shown, 16 images · IV contrast (APPLIED)
Comparison: None.

CLINICAL DATA: Oral mass.

EXAM:
CT NECK WITH CONTRAST
TECHNIQUE: Multidetector CT imaging of the neck was performed using the
standard protocol following the bolus administration of intravenous
contrast.
CONTRAST:  75mL OMNIPAQUE IOHEXOL 300 MG/ML  SOLN

[Series 7: coronal st · coronal · 0.37mm/px · 3 of 129 slices shown]
[im 26/129  bone]
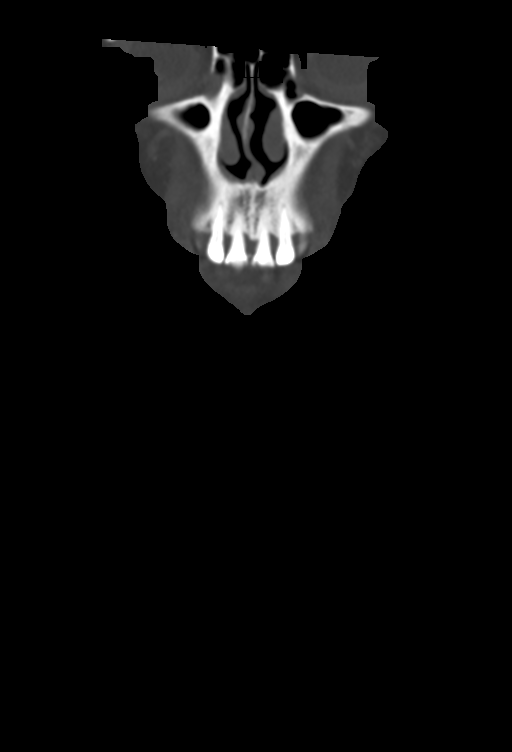
[im 52/129  bone]
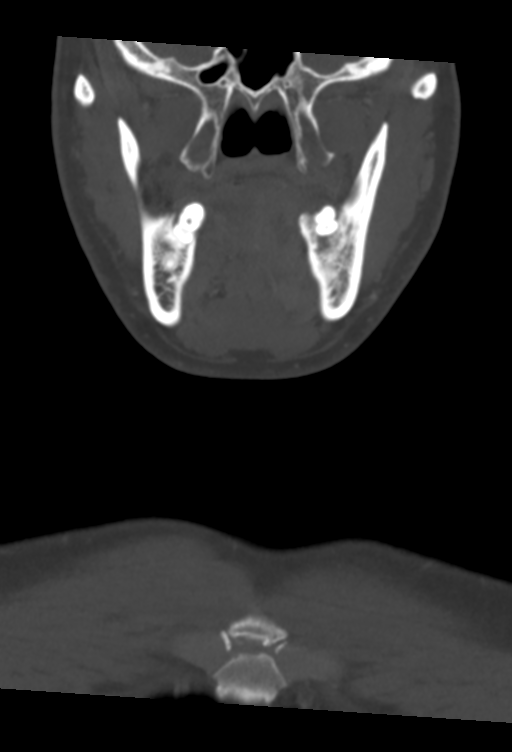
[im 77/129  bone]
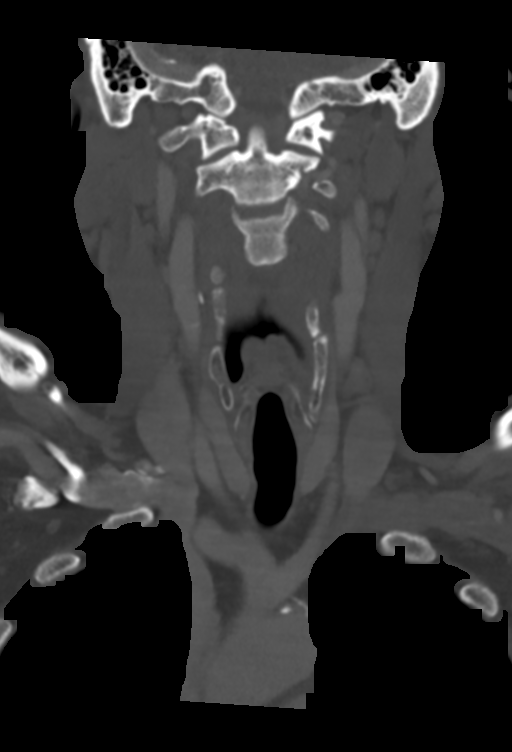

[Series 8: sagittal st · sagittal · 0.52mm/px · 5 of 101 slices shown, 6 images]
[im 34/101  bone]
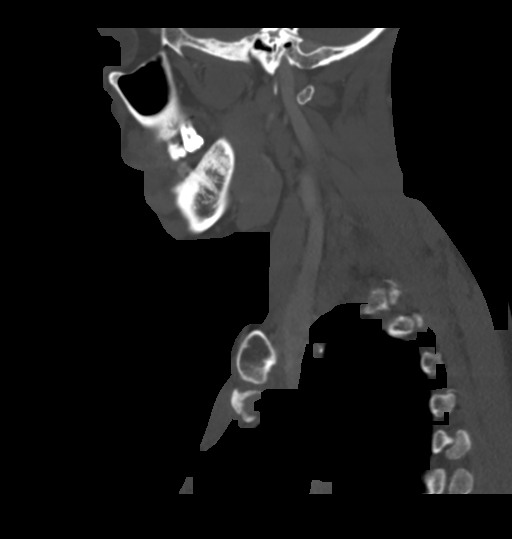
[im 42/101  bone]
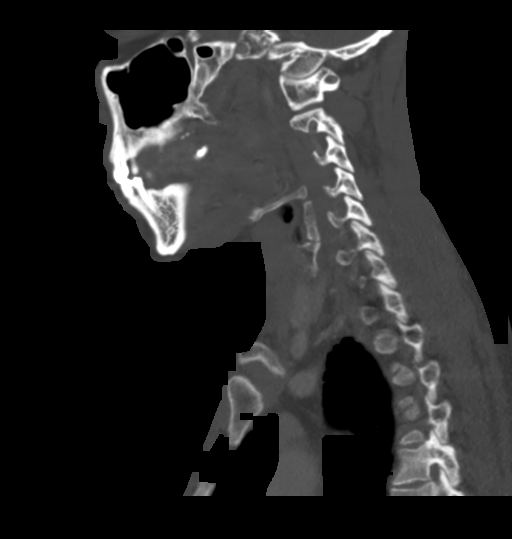
[im 51/101  soft-tissue]
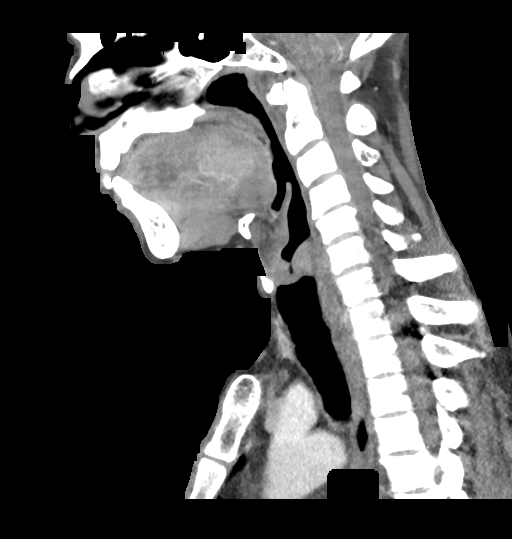
[im 51/101  bone]
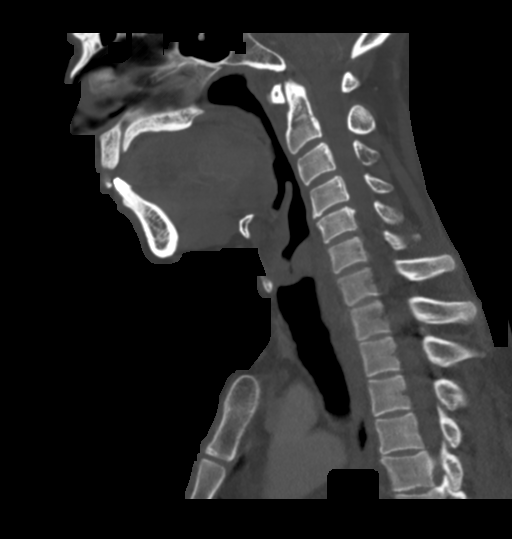
[im 59/101  bone]
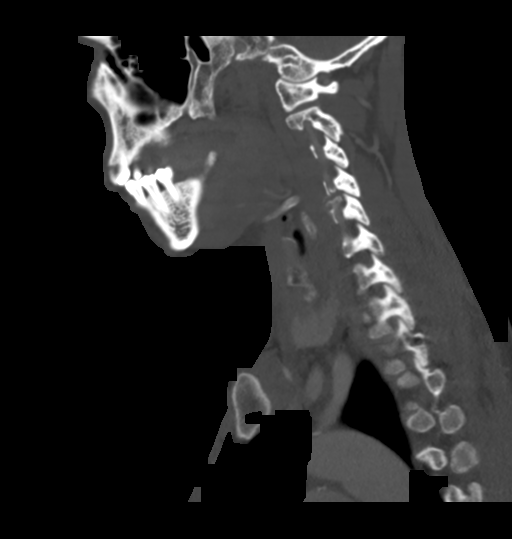
[im 67/101  bone]
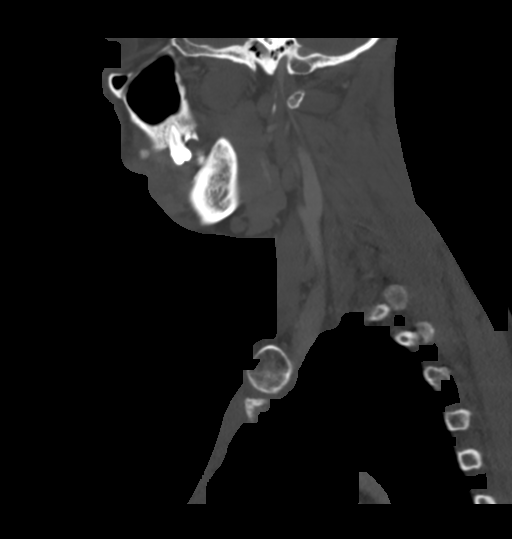

[Series 9: orthogonal st · axial · 0.39mm/px · z∈[+1104,+1303]mm · 5 of 159 slices shown, 7 images]
[im 27/159  soft-tissue]
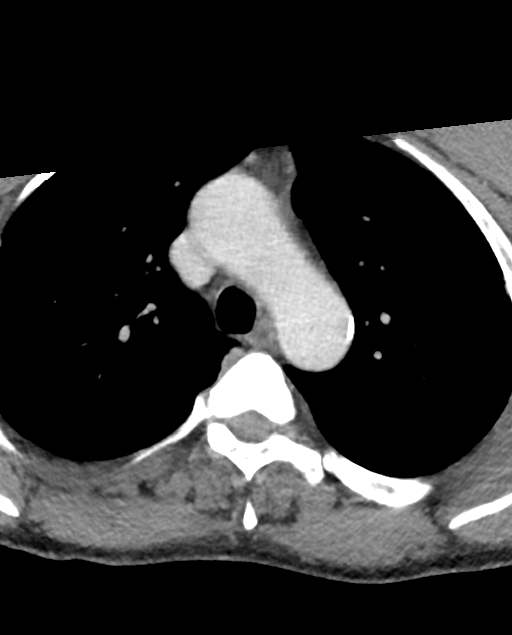
[im 27/159  bone]
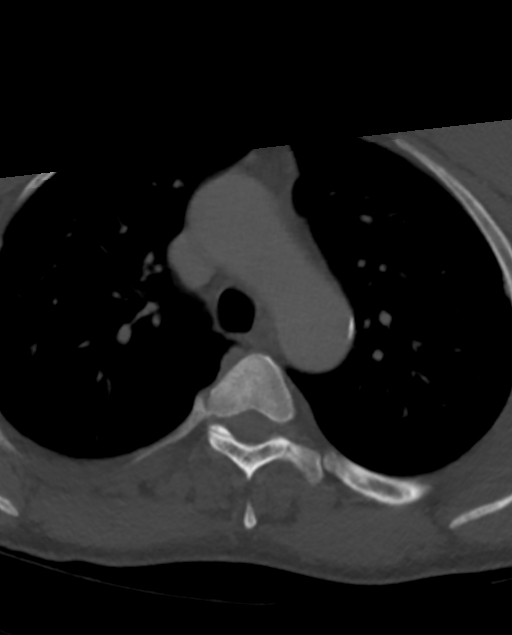
[im 53/159  bone]
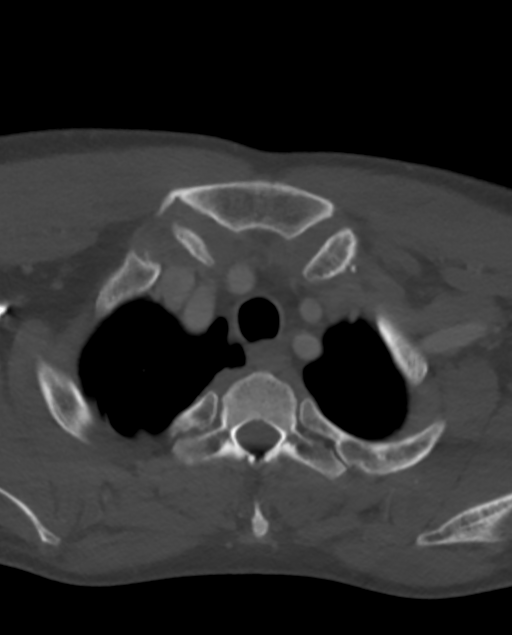
[im 80/159  bone]
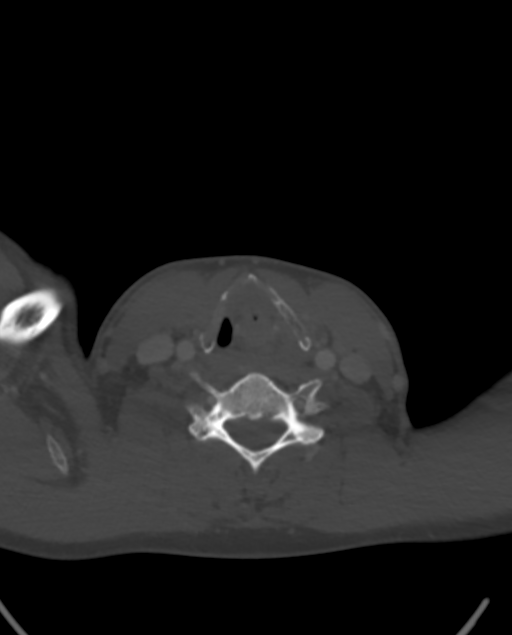
[im 106/159  bone]
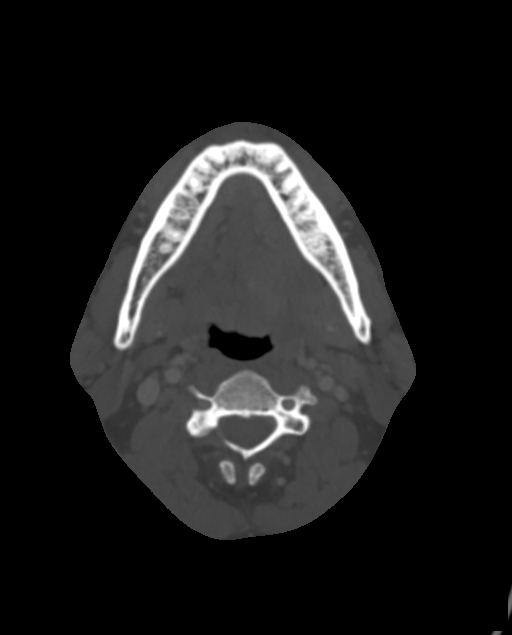
[im 132/159  soft-tissue]
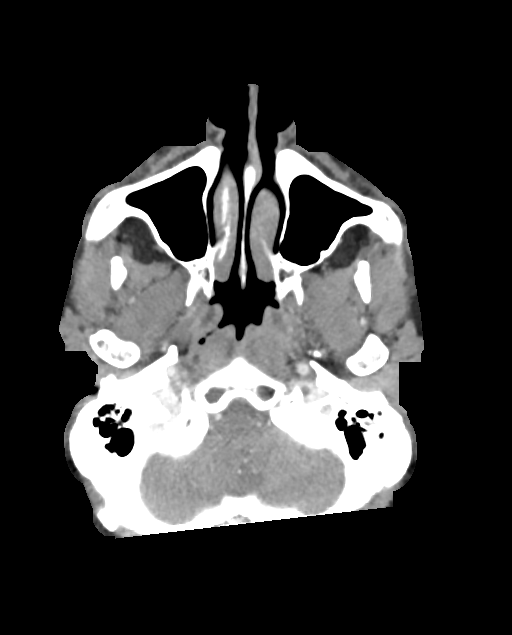
[im 132/159  bone]
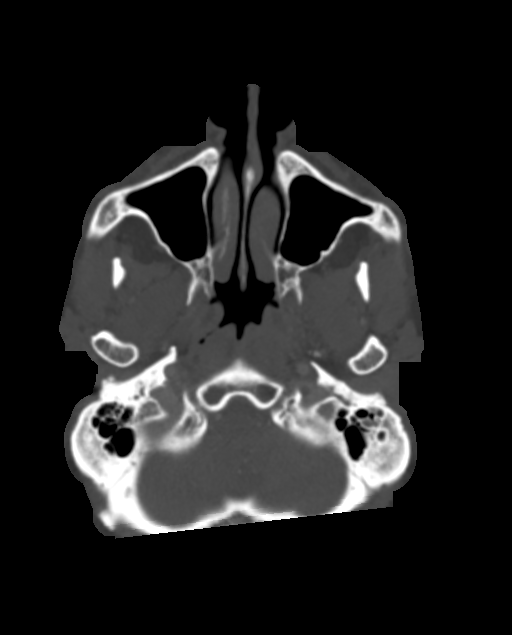

[13 of 35 positions shown; findings below may reference images not displayed]

FINDINGS: Pharynx and larynx: Large mass in the left tongue with central
necrosis. Mass is ill-defined but enhances. The mass involves the
lateral tongue and tongue base. Margins are difficult to delineate.
Estimated tumor size 4.6 by 2.5 by 3.1 cm. The mass extends to the
midline without definite crossing of the midline.

Airway intact.  Epiglottis and larynx normal.

Salivary glands: Parotid and submandibular glands normal
bilaterally. There is enlargement and hyperenhancement of the
sublingual gland on the left which could be due to tumor in
invasion.

Thyroid: Negative

Lymph nodes: No pathologic lymph nodes in the neck. Left level 2
lymph node 9 mm. No nodal necrosis.

Vascular: Normal vascular enhancement

Limited intracranial: Negative

Visualized orbits: Negative

Mastoids and visualized paranasal sinuses: Paranasal sinuses clear.
Mastoid clear.

Skeleton: No acute skeletal abnormality.

Upper chest: Lung apices clear bilaterally.

Other: None
IMPRESSION: Large mass occupying much of the left tongue extending to the tongue
base and containing central necrosis. Findings compatible with
carcinoma of the tongue. There is enlargement of the sublingual
gland on the left which may be due to tumor invasion. No pathologic
lymph nodes in the neck.

## 2020-09-05 SURGERY — LARYNGOSCOPY, WITH ESOPHAGOSCOPY
Anesthesia: General

## 2020-09-05 SURGERY — LARYNGOSCOPY, DIRECT
Anesthesia: General | Site: Throat

## 2020-09-05 MED ORDER — OXYCODONE HCL 5 MG/5ML PO SOLN: 5.0000 mg | Freq: Once | ORAL | Status: AC | PRN

## 2020-09-05 MED ORDER — OXYCODONE HCL 5 MG PO TABS: 5.0000 mg | ORAL_TABLET | Freq: Once | ORAL | Status: AC | PRN

## 2020-09-05 MED ORDER — MEPERIDINE HCL 25 MG/ML IJ SOLN: 6.2500 mg | INTRAMUSCULAR | Status: DC | PRN

## 2020-09-05 MED ORDER — PROPOFOL 10 MG/ML IV BOLUS
INTRAVENOUS | Status: AC
  Filled 2020-09-05: qty 20

## 2020-09-05 MED ORDER — PROMETHAZINE HCL 25 MG/ML IJ SOLN: 6.2500 mg | INTRAMUSCULAR | Status: DC | PRN

## 2020-09-05 MED ORDER — OXYMETAZOLINE HCL 0.05 % NA SOLN
NASAL | Status: AC
  Filled 2020-09-05: qty 30

## 2020-09-05 MED ORDER — DEXAMETHASONE SODIUM PHOSPHATE 10 MG/ML IJ SOLN
INTRAMUSCULAR | Status: DC | PRN
  Administered 2020-09-05: 10 mg via INTRAVENOUS

## 2020-09-05 MED ORDER — IOHEXOL 300 MG/ML  SOLN
75.0000 mL | Freq: Once | INTRAMUSCULAR | Status: AC | PRN
  Administered 2020-09-05: 75 mL via INTRAVENOUS

## 2020-09-05 MED ORDER — OXYCODONE-ACETAMINOPHEN 5-325 MG PO TABS
1.0000 | ORAL_TABLET | ORAL | 0 refills | Status: DC | PRN
Start: 2020-09-05 — End: 2020-09-29

## 2020-09-05 MED ORDER — FENTANYL CITRATE (PF) 100 MCG/2ML IJ SOLN
INTRAMUSCULAR | Status: DC | PRN
  Administered 2020-09-05 (×2): 50 ug via INTRAVENOUS

## 2020-09-05 MED ORDER — ONDANSETRON HCL 4 MG/2ML IJ SOLN
INTRAMUSCULAR | Status: DC | PRN
  Administered 2020-09-05: 4 mg via INTRAVENOUS

## 2020-09-05 MED ORDER — LACTATED RINGERS IV SOLN: INTRAVENOUS | Status: DC | PRN

## 2020-09-05 MED ORDER — LIDOCAINE 2% (20 MG/ML) 5 ML SYRINGE
INTRAMUSCULAR | Status: DC | PRN
  Administered 2020-09-05: 20 mg via INTRAVENOUS

## 2020-09-05 MED ORDER — MIDAZOLAM HCL 2 MG/2ML IJ SOLN: 0.5000 mg | Freq: Once | INTRAMUSCULAR | Status: DC | PRN

## 2020-09-05 MED ORDER — OXYMETAZOLINE HCL 0.05 % NA SOLN
NASAL | Status: DC | PRN
  Administered 2020-09-05: 1

## 2020-09-05 MED ORDER — FENTANYL CITRATE (PF) 100 MCG/2ML IJ SOLN
INTRAMUSCULAR | Status: AC
  Administered 2020-09-05: 50 ug via INTRAVENOUS
  Filled 2020-09-05: qty 2

## 2020-09-05 MED ORDER — SUCCINYLCHOLINE CHLORIDE 20 MG/ML IJ SOLN
INTRAMUSCULAR | Status: DC | PRN
  Administered 2020-09-05: 160 mg via INTRAVENOUS

## 2020-09-05 MED ORDER — OXYCODONE HCL 5 MG PO TABS
ORAL_TABLET | ORAL | Status: AC
  Administered 2020-09-05: 5 mg via ORAL
  Filled 2020-09-05: qty 1

## 2020-09-05 MED ORDER — FENTANYL CITRATE (PF) 250 MCG/5ML IJ SOLN
INTRAMUSCULAR | Status: AC
  Filled 2020-09-05: qty 5

## 2020-09-05 MED ORDER — MIDAZOLAM HCL 5 MG/5ML IJ SOLN
INTRAMUSCULAR | Status: DC | PRN
  Administered 2020-09-05: 1 mg via INTRAVENOUS

## 2020-09-05 MED ORDER — PROPOFOL 10 MG/ML IV BOLUS
INTRAVENOUS | Status: DC | PRN
  Administered 2020-09-05: 40 mg via INTRAVENOUS
  Administered 2020-09-05: 150 mg via INTRAVENOUS
  Administered 2020-09-05: 50 mg via INTRAVENOUS

## 2020-09-05 MED ORDER — FENTANYL CITRATE (PF) 100 MCG/2ML IJ SOLN
25.0000 ug | INTRAMUSCULAR | Status: DC | PRN
  Administered 2020-09-05: 50 ug via INTRAVENOUS

## 2020-09-05 MED ORDER — MIDAZOLAM HCL 2 MG/2ML IJ SOLN
INTRAMUSCULAR | Status: AC
  Filled 2020-09-05: qty 2

## 2020-09-05 MED ORDER — 0.9 % SODIUM CHLORIDE (POUR BTL) OPTIME
TOPICAL | Status: DC | PRN
  Administered 2020-09-05: 1000 mL

## 2020-09-05 SURGICAL SUPPLY — 26 items
BALLN PULM 15 16.5 18X75 (BALLOONS)
BALLOON PULM 15 16.5 18X75 (BALLOONS) IMPLANT
CANISTER SUCT 3000ML PPV (MISCELLANEOUS) ×2 IMPLANT
CNTNR URN SCR LID CUP LEK RST (MISCELLANEOUS) IMPLANT
CONT SPEC 4OZ STRL OR WHT (MISCELLANEOUS)
COVER BACK TABLE 60X90IN (DRAPES) ×2 IMPLANT
COVER MAYO STAND STRL (DRAPES) ×2 IMPLANT
COVER WAND RF STERILE (DRAPES) ×2 IMPLANT
DRAPE HALF SHEET 40X57 (DRAPES) ×2 IMPLANT
GAUZE 4X4 16PLY RFD (DISPOSABLE) ×2 IMPLANT
GLOVE BIO SURGEON STRL SZ7 (GLOVE) ×2 IMPLANT
GUARD TEETH (MISCELLANEOUS) IMPLANT
KIT BASIN OR (CUSTOM PROCEDURE TRAY) ×2 IMPLANT
KIT TURNOVER KIT B (KITS) ×2 IMPLANT
NEEDLE 18GX1X1/2 (RX/OR ONLY) (NEEDLE) IMPLANT
NEEDLE HYPO 25GX1X1/2 BEV (NEEDLE) IMPLANT
NS IRRIG 1000ML POUR BTL (IV SOLUTION) ×2 IMPLANT
PAD ARMBOARD 7.5X6 YLW CONV (MISCELLANEOUS) ×4 IMPLANT
PATTIES SURGICAL .5 X3 (DISPOSABLE) IMPLANT
SOL ANTI FOG 6CC (MISCELLANEOUS) IMPLANT
SOLUTION ANTI FOG 6CC (MISCELLANEOUS)
SOLUTION BUTLER CLEAR DIP (MISCELLANEOUS) IMPLANT
SURGILUBE 2OZ TUBE FLIPTOP (MISCELLANEOUS) ×2 IMPLANT
TOWEL GREEN STERILE FF (TOWEL DISPOSABLE) ×2 IMPLANT
TUBE CONNECTING 12X1/4 (SUCTIONS) ×2 IMPLANT
WATER STERILE IRR 1000ML POUR (IV SOLUTION) ×2 IMPLANT

## 2020-09-05 NOTE — Op Note (Signed)
Procedure(s): DIRECT LARYNGOSCOPY with BIOPSY CERVICAL ESOPHAGOSCOPY    LASH MATULICH male 44 y.o. 09/05/2020  Procedure(s) and Anesthesia Type:    * DIRECT LARYNGOSCOPY - General    * TONGUE BIOPSY - General    * ESOPHAGOSCOPY - General  Surgeon(s) and Role:    * Marcina Millard, MD - Primary        Surgeon: Marcina Millard   Assistants: none  Anesthesia: General endotracheal anesthesia   Procedure Detail  After informed consent was obtained the patient was put to sleep and intubated without difficulty.  His eyes were taped and a head wrap placed.  The head of the bed was turned 90 degrees.  The Dedo laryngoscope was used to examine the larynx.  He was pristine.  There was no evidence of any abnormalities in either piriform sinus, the larynx, with a hypopharynx.  The nasopharynx was symmetric and exhibited no disease.  The esophagoscope was passed into the cervical esophagus.  There was no involvement of the esophageal inlet or esophagus to the mid thoracic region.  Palpation of the tongue revealed a firm mass measuring approximately 3 cm in width and extending to the midline of the tongue within the oral cavity and 5 cm in length extending from the left lateral oral cavity tongue to the left base of tongue and oropharynx.  There was no extension beyond the tongue into the surrounding muscles or structures.  Direct laryngoscopy was performed which showed an ulcerative, cavitary lesion involving the left lateral tongue extending down to the tongue base.  Multiple biopsies were taken of this area including the base of the ulcer as well as the edges of the ulcerated lesion encompassing both abnormal and seemingly normal mucosa.  Bleeding was controlled using Afrin-soaked pledgets.  All of those pledgets were removed and counted twice assuring numbers were correct.  There was no further bleeding.  The stomach was emptied free of secretions.  The patient's care was turned back to  anesthesia for extubation and wake up.  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              Complications:  * No complications entered in OR log *         Disposition: PACU - hemodynamically stable.         Condition: stable

## 2020-09-05 NOTE — Transfer of Care (Signed)
Immediate Anesthesia Transfer of Care Note  Patient: [name]  Procedure(s) Performed: DIRECT LARYNGOSCOPY (N/A Throat) TONGUE BIOPSY (N/A Throat) ESOPHAGOSCOPY (N/A Throat)  Patient Location: PACU  Anesthesia Type:General  Level of Consciousness: awake and alert   Airway & Oxygen Therapy: Patient Spontanous Breathing and Patient connected to face mask oxygen  Post-op Assessment: Report given to RN and Post -op Vital signs reviewed and stable  Post vital signs: Reviewed and stable  Last Vitals:  Vitals Value Taken Time  BP 150/114 09/05/20 2039  Temp    Pulse 99 09/05/20 2040  Resp 18 09/05/20 2040  SpO2 100 % 09/05/20 2040  Vitals shown include unvalidated device data.  Last Pain:  Vitals:   09/05/20 1847  TempSrc: Oral  PainSc: 4          Complications: No complications documented.

## 2020-09-05 NOTE — ED Provider Notes (Signed)
  Physical Exam  BP (!) 143/93 (BP Location: Left Arm)   Pulse 90   Temp 98.9 F (37.2 C) (Oral)   Resp 16   SpO2 99%   Physical Exam  ED Course/Procedures   Procedures  MDM  Care of this patient assumed from off going provider at 1500.  Patient coming in today for mouth pain, especially tongue pain.  Concern for abscess or cancer.  Pending CT scan.  CT scan concerning for cancer of the tongue.  Consulted ENT, who reported they would come evaluate the patient for likely biopsy today.  Patient concerned that he does not have insurance.  Consulted case management for further assistance with facilitating outpatient follow-up and care for new cancer diagnosis.  ENT evaluated the patient, confirmed that patient would be taken for biopsy today.  COVID obtained and negative.  Patient transported to the OR in stable condition.  The care of this patient was overseen by Dr. Alvino Chapel, ED attending, who agreed with evaluation and management of the patient.      Launa Flight, MD 09/05/20 2152    Davonna Belling, MD 09/05/20 2337

## 2020-09-05 NOTE — H&P (Addendum)
Subjective:     [name] is a 44 y.o. male who presents for evaluation of oral cavity pain for several months.  Pain has slowly worsened over the past several weeks and he presented to the ER this evening for care.  Patient denies any hemoptysis, shortness of breath, change in voice, or significant weight loss.  Patient said no constitutional symptoms or no neck mass.  He smokes cigarettes and uses marijuana.  He denies other medical issues.  He has not been vaccinated for COVID-19.  Patient History:  The following portions of the patient's history were reviewed and updated as appropriate: allergies, current medications, past family history, past medical history, past social history, past surgical history and problem list.  Review of Systems Pertinent items are noted in HPI.    Objective:    BP (!) 143/93 (BP Location: Left Arm)    Pulse 90    Temp 98.9 F (37.2 C) (Oral)    Resp 16    SpO2 99%   Alert and oriented x 3 in no acute distress Communicates well in complete sentences without dyspnea or hoarseness Face without bony step-offs or sinus tenderness Neck without cervical adenopathy/salivary glands normal/trachea midline Chest symmetric expansions bilaterally without use of accessory muscles Regular rate and rhythm without murmurs Cranial nerves II through XII intact/no focal motor or sensory deficits There is a large firm mass in the left lateral tongue base extending anteriorly into the oral cavity/no restriction of tongue mobility  Flexible fiberoptic laryngoscopy was performed.  The nasopharynx, hypopharynx, larynx, and piriform sinuses appear entirely normal.  This lesion does not impact the patency of his airway nor would it impact our ability to intubate the patient for surgery.  CT scan neck - Patient has a large 5 x 3 cm mass in the left lateral tongue and tongue base extending to and probably crossing over the midline.  No cervical nodes seen on scan.  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  Given the patient's age, HPV positive cancer is most likely and is a positive prognostic sign.  Patient is uninsured and has trouble securing outpatient follow-up.  Given that reality, I think it best to move forward with completion of his work-up while he is here in the hospital.  He has been n.p.o. for 8 hours.  I discussed it with the patient and he wishes to go to the operating room as recommended this evening for direct laryngoscopy, esophagoscopy, and biopsy of the lesion.  This should complete his work-up at which time he can be referred for definitive treatment.  I anticipate discharge home this evening after his procedure.

## 2020-09-05 NOTE — Anesthesia Preprocedure Evaluation (Addendum)
Anesthesia Evaluation  Patient identified by MRN, date of birth, ID band Patient awake    Reviewed: Allergy & Precautions, NPO status , Patient's Chart, lab work & pertinent test results  History of Anesthesia Complications Negative for: history of anesthetic complications  Airway Mallampati: I  TM Distance: >3 FB Neck ROM: Full    Dental  (+) Chipped, Dental Advisory Given   Pulmonary Current SmokerPatient did not abstain from smoking.,  09/05/2020 SARS coronavirus NEG   breath sounds clear to auscultation       Cardiovascular negative cardio ROS   Rhythm:Regular Rate:Normal     Neuro/Psych negative neurological ROS     GI/Hepatic (+)     substance abuse  marijuana use, esoph and tongue mass   Endo/Other  negative endocrine ROS  Renal/GU negative Renal ROS     Musculoskeletal   Abdominal   Peds  Hematology negative hematology ROS (+)   Anesthesia Other Findings   Reproductive/Obstetrics                           Anesthesia Physical Anesthesia Plan  ASA: II  Anesthesia Plan: General   Post-op Pain Management:    Induction: Intravenous  PONV Risk Score and Plan: 1 and Ondansetron and Dexamethasone  Airway Management Planned: Oral ETT  Additional Equipment: None  Intra-op Plan:   Post-operative Plan: Extubation in OR  Informed Consent: I have reviewed the patients History and Physical, chart, labs and discussed the procedure including the risks, benefits and alternatives for the proposed anesthesia with the patient or authorized representative who has indicated his/her understanding and acceptance.     Dental advisory given  Plan Discussed with: CRNA and Surgeon  Anesthesia Plan Comments:        Anesthesia Quick Evaluation

## 2020-09-05 NOTE — Anesthesia Postprocedure Evaluation (Signed)
Anesthesia Post Note  Patient: [name]  Procedure(s) Performed: DIRECT LARYNGOSCOPY (N/A Throat) TONGUE BIOPSY (N/A Throat) ESOPHAGOSCOPY (N/A Throat)     Patient location during evaluation: PACU Anesthesia Type: General Level of consciousness: awake and alert, patient cooperative and oriented Pain management: pain level controlled Vital Signs Assessment: post-procedure vital signs reviewed and stable Respiratory status: spontaneous breathing, nonlabored ventilation and respiratory function stable Cardiovascular status: blood pressure returned to baseline and stable Postop Assessment: no apparent nausea or vomiting Anesthetic complications: no   No complications documented.  Last Vitals:  Vitals:   09/05/20 2130 09/05/20 2145  BP: (!) 162/94 (!) 165/87  Pulse: 75 77  Resp: 20   Temp:    SpO2: 100% 99%    Last Pain:  Vitals:   09/05/20 2052  TempSrc:   PainSc: 8                  Jasamine Pottinger,E. Kevontay Burks

## 2020-09-05 NOTE — ED Notes (Addendum)
Pt transported to CT at this time.

## 2020-09-05 NOTE — Anesthesia Procedure Notes (Signed)
Procedure Name: Intubation Date/Time: 09/05/2020 7:59 PM Performed by: Suzy Bouchard, CRNA Pre-anesthesia Checklist: Patient identified, Emergency Drugs available, Timeout performed, Patient being monitored and Suction available Patient Re-evaluated:Patient Re-evaluated prior to induction Oxygen Delivery Method: Circle system utilized Preoxygenation: Pre-oxygenation with 100% oxygen Induction Type: IV induction and Rapid sequence Laryngoscope Size: Glidescope, 3 and 4 Grade View: Grade I Tube type: Oral Tube size: 7.5 mm Number of attempts: 2 Airway Equipment and Method: Stylet and Video-laryngoscopy Placement Confirmation: ETT inserted through vocal cords under direct vision,  positive ETCO2 and breath sounds checked- equal and bilateral Secured at: 24 cm Tube secured with: Tape Dental Injury: Bloody posterior oropharynx

## 2020-09-05 NOTE — ED Provider Notes (Signed)
[facility] EMERGENCY DEPARTMENT Provider Note   CSN: [id number] Arrival date & time: 09/05/20  1329     History Chief Complaint  Patient presents with  . Dental Pain    [name] is a 44 y.o. male.  44 year old male with complaint of left lower tongue/dental/jaw pain. Patient was seen here about a month ago and given antibiotics which he took for a few days before losing the medication. Reports ongoing, worsening problem.        History reviewed. No pertinent past medical history.  There are no problems to display for this patient.   Past Surgical History:  Procedure Laterality Date  . FRACTURE SURGERY         No family history on file.  Social History   Tobacco Use  . Smoking status: Current Every Day Smoker    Types: Cigarettes  . Smokeless tobacco: Never Used  Substance Use Topics  . Alcohol use: Yes  . Drug use: Yes    Types: Marijuana    Home Medications Prior to Admission medications   Medication Sig Start Date End Date Taking? Authorizing Provider  amoxicillin-clavulanate (AUGMENTIN) 875-125 MG tablet Take 1 tablet by mouth every 12 (twelve) hours. 07/17/20   Mesner, Corene Cornea, MD  chlorhexidine (PERIDEX) 0.12 % solution Use as directed 15 mLs in the mouth or throat 2 (two) times daily. 07/17/20   Mesner, Corene Cornea, MD  naproxen (NAPROSYN) 500 MG tablet Take 1 tablet (500 mg total) by mouth 2 (two) times daily as needed for moderate pain. 07/17/20   Mesner, Corene Cornea, MD    Allergies    Patient has no known allergies.  Review of Systems   Review of Systems  Constitutional: Negative for fever.  HENT: Positive for dental problem. Negative for ear pain, facial swelling, trouble swallowing and voice change.   Gastrointestinal: Negative for nausea and vomiting.  Musculoskeletal: Negative for neck pain and neck stiffness.  Allergic/Immunologic: Negative for immunocompromised state.  Neurological: Negative for headaches.  All other systems  reviewed and are negative.   Physical Exam Updated Vital Signs BP (!) 143/93 (BP Location: Left Arm)   Pulse 90   Temp 98.9 F (37.2 C) (Oral)   Resp 16   SpO2 99%   Physical Exam Vitals and nursing note reviewed.  Constitutional:      General: He is not in acute distress.    Appearance: He is well-developed. He is not diaphoretic.  HENT:     Head: Normocephalic and atraumatic.     Comments: Bony mass to left mandible. Lesion/wound to left side tongue causing defect in left side of tongue, no submandibular fullness or tenderness, no trismus.     Mouth/Throat:     Mouth: Mucous membranes are moist.     Pharynx: No oropharyngeal exudate or posterior oropharyngeal erythema.  Pulmonary:     Effort: Pulmonary effort is normal.  Musculoskeletal:     Cervical back: Neck supple. No tenderness.  Lymphadenopathy:     Cervical: No cervical adenopathy.  Skin:    General: Skin is warm and dry.     Findings: No erythema or rash.  Neurological:     Mental Status: He is alert and oriented to person, place, and time.  Psychiatric:        Behavior: Behavior normal.          ED Results / Procedures / Treatments   Labs (all labs ordered are listed, but only abnormal results are displayed) Labs  Reviewed  BASIC METABOLIC PANEL - Abnormal; Notable for the following components:      Result Value   Sodium 134 (*)    Potassium 3.4 (*)    Glucose, Bld 108 (*)    All other components within normal limits  CBC WITH DIFFERENTIAL/PLATELET    EKG None  Radiology CT Soft Tissue Neck W Contrast  Result Date: 09/05/2020 CLINICAL DATA:  Oral mass. EXAM: CT NECK WITH CONTRAST TECHNIQUE: Multidetector CT imaging of the neck was performed using the standard protocol following the bolus administration of intravenous contrast. CONTRAST:  66mL OMNIPAQUE IOHEXOL 300 MG/ML  SOLN COMPARISON:  None. FINDINGS: Pharynx and larynx: Large mass in the left tongue with central necrosis. Mass is  ill-defined but enhances. The mass involves the lateral tongue and tongue base. Margins are difficult to delineate. Estimated tumor size 4.6 by 2.5 by 3.1 cm. The mass extends to the midline without definite crossing of the midline. Airway intact.  Epiglottis and larynx normal. Salivary glands: Parotid and submandibular glands normal bilaterally. There is enlargement and hyperenhancement of the sublingual gland on the left which could be due to tumor in invasion. Thyroid: Negative Lymph nodes: No pathologic lymph nodes in the neck. Left level 2 lymph node 9 mm. No nodal necrosis. Vascular: Normal vascular enhancement Limited intracranial: Negative Visualized orbits: Negative Mastoids and visualized paranasal sinuses: Paranasal sinuses clear. Mastoid clear. Skeleton: No acute skeletal abnormality. Upper chest: Lung apices clear bilaterally. Other: None IMPRESSION: Large mass occupying much of the left tongue extending to the tongue base and containing central necrosis. Findings compatible with carcinoma of the tongue. There is enlargement of the sublingual gland on the left which may be due to tumor invasion. No pathologic lymph nodes in the neck. Electronically Signed   By: Franchot Gallo M.D.   On: 09/05/2020 15:35    Procedures Procedures (including critical care time)  Medications Ordered in ED Medications  iohexol (OMNIPAQUE) 300 MG/ML solution 75 mL (75 mLs Intravenous Contrast Given 09/05/20 1508)    ED Course  I have reviewed the triage vital signs and the nursing notes.  Pertinent labs & imaging results that were available during my care of the patient were reviewed by me and considered in my medical decision making (see chart for details).  Clinical Course as of Sep 06 1543  Sun Sep 05, 9392  6880 44 year old male with complaint of left mandible pain pain in his tongue. On exam found to have bony growth over left lower mandible.  Lesion extending into the left side. Concern for  abscess. Labs including CBC and BMP, care signed out pending CT soft tissue neck to assess for mass.   [LM]    Clinical Course User Index [LM] Roque Lias   MDM Rules/Calculators/A&P                          Final Clinical Impression(s) / ED Diagnoses Final diagnoses:  None    Rx / DC Orders ED Discharge Orders    None       Tacy Learn, PA-C 09/05/20 1544    Blanchie Dessert, MD 09/08/20 1639

## 2020-09-05 NOTE — Discharge Summary (Signed)
Physician Discharge Summary  Patient ID: [name] MRN: [id number] DOB/AGE: 1976-10-04 44 y.o.  Admit date: 09/05/2020 Discharge date: 09/05/2020  Admission Diagnoses: tongue mass   Discharge Diagnoses: same Active Problems:   * No active hospital problems. *   Discharged Condition: good  Hospital Course: Patient has had a chest and neck CT which show a large tumor largely replacing the left lateral and base of tongue.  The patient was taken to the operating room for direct laryngoscopy with biopsy.  These biopsies were sent to pathology and are pending.  Patient is stable and will be sent home at this time with follow-up in 1 week with Dr. Victorio Palm group.  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  Consults: Rad onc  Discharge Exam: Blood pressure (!) 155/94, pulse 68, temperature 98.4 F (36.9 C), temperature source Oral, resp. rate 17, SpO2 98 %.  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  It extends to the midline and surgical resection could be considered.  I am having him see ENT here in follow-up as well.   Diet - low sodium heart healthy   Complete by: As directed    Discharge instructions   Complete by: As directed    Patient needs follow up with ENT to discuss next steps and review biopsies.  Please call Sherlynn Carbon at [phone] to schedule follow up visit within one week from today.  Expect some bloody spit for a day or so following the biopsies.   Increase activity slowly   Complete by: As directed        Follow-up Information    [facility]. Call in 1  day.   Why: To obtain primary care doctor Contact information: [place] [phone] [phone]       Jodi Geralds, MD .   Specialties: Pediatrics, Radiology Contact information: [address] [place] [address] [phone]        Jerrell Belfast, MD Follow up in 1 week(s).   Specialty: Otolaryngology Why: Patient should follow up with Dr. Wilburn Cornelia or his partner within one week of today.  Patient may call Sherlynn Carbon to facilitate the appointment. Contact information: [address] [place] [address] [phone]               Signed: [name] 09/05/2020, 8:33 PM

## 2020-09-05 NOTE — ED Triage Notes (Signed)
Pt reports left upper and lower dental pain x 1 month.

## 2020-09-06 ENCOUNTER — Encounter (HOSPITAL_COMMUNITY): Payer: Self-pay | Admitting: Otolaryngology

## 2020-09-09 LAB — SURGICAL PATHOLOGY

## 2020-09-09 NOTE — Progress Notes (Signed)
Oncology Nurse Navigator Documentation  I called and spoke to [name]. I informed him that after his recent biopsy he needed to see an ENT here in [place]. I informed him that I have scheduled him to see Dr. Constance Holster at [facility] on 11/15 at 3:00. I provided him with the address and my direct phone number if he has any further needs or concerns. He is agreeable to the appointment.   Harlow Asa RN, BSN, OCN Head & Neck Oncology Nurse [place] at [facility] Phone # [phone]  Fax # [phone]

## 2020-09-13 DIAGNOSIS — C029 Malignant neoplasm of tongue, unspecified: Secondary | ICD-10-CM | POA: Insufficient documentation

## 2020-09-13 DIAGNOSIS — Z789 Other specified health status: Secondary | ICD-10-CM | POA: Insufficient documentation

## 2020-09-15 ENCOUNTER — Telehealth: Payer: Self-pay | Admitting: Radiation Oncology

## 2020-09-15 NOTE — Telephone Encounter (Signed)
Per Harlow Asa, RN, - The patient has seen Dr. Constance Holster at ENT. He has referred him to [facility]. There is no need to schedule him with Dr. Isidore Moos at this time. It is possible that he may be referred back after surgery at [facility] but that will come from them. I have closed this referral.

## 2020-09-16 ENCOUNTER — Inpatient Hospital Stay: Payer: Self-pay | Admitting: Physician Assistant

## 2020-09-21 ENCOUNTER — Ambulatory Visit: Payer: Self-pay | Attending: Physician Assistant | Admitting: Internal Medicine

## 2020-09-21 ENCOUNTER — Other Ambulatory Visit: Payer: Self-pay

## 2020-09-21 ENCOUNTER — Encounter: Payer: Self-pay | Admitting: Internal Medicine

## 2020-09-21 ENCOUNTER — Telehealth: Payer: Self-pay | Admitting: Internal Medicine

## 2020-09-21 DIAGNOSIS — I10 Essential (primary) hypertension: Secondary | ICD-10-CM

## 2020-09-21 DIAGNOSIS — C029 Malignant neoplasm of tongue, unspecified: Secondary | ICD-10-CM

## 2020-09-21 DIAGNOSIS — Z2821 Immunization not carried out because of patient refusal: Secondary | ICD-10-CM | POA: Insufficient documentation

## 2020-09-21 MED ORDER — CHLORTHALIDONE 25 MG PO TABS: 25.0000 mg | ORAL_TABLET | Freq: Every day | ORAL | 3 refills | Status: DC

## 2020-09-21 NOTE — Telephone Encounter (Signed)
Patient is calling to ask Dr. Leanne Chang he received script for chlorthalidone (HYGROTON) 25 MG tablet [[id number]] . However, he thought Dr. Leanne Chang was going to call in some medication for pain. Patient states when he eats it hurts. Please advise [phone] Preferred Pharmacy-[facility] [place], [place].

## 2020-09-21 NOTE — Assessment & Plan Note (Signed)
He has f/u scheduled  I have mentioned to him that radiation oncology may be indicated. If that is the case he should receive therapy locally for his convenience.

## 2020-09-21 NOTE — Progress Notes (Signed)
Pt s/p biopsy of tongue. Presumed malignancy and he has f/u with [facility] December 30.  He is not having much pain and he is able to maintain an appetite.  No other concerns.  He is working daily and Hospital doctor.   Still smoking 1pp q 3 days. and drinks a beer a day  History reviewed. No pertinent past medical history.  Social History   Socioeconomic History  . Marital status: Single    Spouse name: Not on file  . Number of children: Not on file  . Years of education: Not on file  . Highest education level: Not on file  Occupational History  . Not on file  Tobacco Use  . Smoking status: Current Every Day Smoker    Types: Cigarettes  . Smokeless tobacco: Never Used  Substance and Sexual Activity  . Alcohol use: Yes  . Drug use: Yes    Types: Marijuana  . Sexual activity: Not on file  Other Topics Concern  . Not on file  Social History Narrative  . Not on file   Social Determinants of Health   Financial Resource Strain:   . Difficulty of Paying Living Expenses: Not on file  Food Insecurity:   . Worried About Charity fundraiser in the Last Year: Not on file  . Ran Out of Food in the Last Year: Not on file  Transportation Needs:   . Lack of Transportation (Medical): Not on file  . Lack of Transportation (Non-Medical): Not on file  Physical Activity:   . Days of Exercise per Week: Not on file  . Minutes of Exercise per Session: Not on file  Stress:   . Feeling of Stress : Not on file  Social Connections:   . Frequency of Communication with Friends and Family: Not on file  . Frequency of Social Gatherings with Friends and Family: Not on file  . Attends Religious Services: Not on file  . Active Member of Clubs or Organizations: Not on file  . Attends Archivist Meetings: Not on file  . Marital Status: Not on file  Intimate Partner Violence:   . Fear of Current or Ex-Partner: Not on file  . Emotionally Abused: Not on file  . Physically Abused: Not on  file  . Sexually Abused: Not on file    Past Surgical History:  Procedure Laterality Date  . DIRECT LARYNGOSCOPY N/A 09/05/2020   Procedure: DIRECT LARYNGOSCOPY;  Surgeon: Marcina Millard, MD;  Location: [facility];  Service: ENT;  Laterality: N/A;  . ESOPHAGOSCOPY N/A 09/05/2020   Procedure: ESOPHAGOSCOPY;  Surgeon: Marcina Millard, MD;  Location: [facility];  Service: ENT;  Laterality: N/A;  . FRACTURE SURGERY    . TONGUE BIOPSY N/A 09/05/2020   Procedure: TONGUE BIOPSY;  Surgeon: Marcina Millard, MD;  Location: [facility];  Service: ENT;  Laterality: N/A;    History reviewed. No pertinent family history.  No Known Allergies  Current Outpatient Medications on File Prior to Visit  Medication Sig Dispense Refill  . oxyCODONE-acetaminophen (PERCOCET) 5-325 MG tablet Take 1-2 tablets by mouth every 4 (four) hours as needed for severe pain. (Patient not taking: Reported on 09/21/2020) 35 tablet 0   No current facility-administered medications on file prior to visit.     patient denies chest pain, shortness of breath, orthopnea. Denies lower extremity edema, abdominal pain, change in appetite, change in bowel movements. Patient denies rashes, musculoskeletal complaints. No other specific complaints in a complete review of systems.  BP (!) 165/99   Pulse 82   Resp 16   Ht 5\' 9"  (1.753 m)   Wt 147 lb 9.6 oz (67 kg)   SpO2 100%   BMI 21.80 kg/m   well-developed well-nourished male in no acute distress. HEENT exam atraumatic, normocephalic, neck supple without jugular venous distention. Oropharynx.. he has a 3 cm mass on tongue- left sided. Chest clear to auscultation cardiac exam S1-S2 are regular. Abdominal exam thin with bowel sounds, soft and nontender. Extremities no edema. Neurologic exam is alert with a normal gait.  Hypertension I have reviewed bps over time- he needs treatemnt Chlorthalidone- side effects discussed (there really shouldn't be any).  F/u 3 months  Tongue  carcinoma ([place]) He has f/u scheduled  I have mentioned to him that radiation oncology may be indicated. If that is the case he should receive therapy locally for his convenience.

## 2020-09-21 NOTE — Assessment & Plan Note (Signed)
I have reviewed bps over time- he needs treatemnt Chlorthalidone- side effects discussed (there really shouldn't be any).  F/u 3 months

## 2020-09-21 NOTE — Telephone Encounter (Signed)
Called pt unable to reach or leave VM. Option unavalable

## 2020-09-22 NOTE — Telephone Encounter (Signed)
Clarified with pt ! Rx was sent to pharmacy by Dr Leanne Chang on 09/21/2020

## 2020-09-22 NOTE — Telephone Encounter (Signed)
Pt called back stated he needs pain medication/ He has no more Percocet meds left at this time. Please adice !

## 2020-09-29 ENCOUNTER — Encounter: Payer: Self-pay | Admitting: Physician Assistant

## 2020-09-29 ENCOUNTER — Other Ambulatory Visit: Payer: Self-pay

## 2020-09-29 ENCOUNTER — Ambulatory Visit: Payer: Self-pay

## 2020-09-29 ENCOUNTER — Ambulatory Visit: Payer: Self-pay | Attending: Physician Assistant | Admitting: Physician Assistant

## 2020-09-29 VITALS — BP 150/95 | HR 113 | Temp 98.7°F | Resp 18 | Ht 69.0 in | Wt 142.0 lb

## 2020-09-29 DIAGNOSIS — Z114 Encounter for screening for human immunodeficiency virus [HIV]: Secondary | ICD-10-CM

## 2020-09-29 DIAGNOSIS — Z13220 Encounter for screening for lipoid disorders: Secondary | ICD-10-CM

## 2020-09-29 DIAGNOSIS — C029 Malignant neoplasm of tongue, unspecified: Secondary | ICD-10-CM

## 2020-09-29 DIAGNOSIS — Z1159 Encounter for screening for other viral diseases: Secondary | ICD-10-CM

## 2020-09-29 DIAGNOSIS — I10 Essential (primary) hypertension: Secondary | ICD-10-CM

## 2020-09-29 MED ORDER — IBUPROFEN 800 MG PO TABS: 800.0000 mg | ORAL_TABLET | Freq: Three times a day (TID) | ORAL | 0 refills | Status: DC | PRN

## 2020-09-29 MED ORDER — ACETAMINOPHEN-CODEINE #3 300-30 MG PO TABS: 1.0000 | ORAL_TABLET | Freq: Four times a day (QID) | ORAL | 0 refills | Status: AC | PRN

## 2020-09-29 NOTE — Progress Notes (Signed)
Established Patient Office Visit  Subjective:  Patient ID: [name], male    DOB: 1976-09-16  Age: 44 y.o. MRN: [id number]  CC:  Chief Complaint  Patient presents with  . Blood Pressure Check    HPI [name] reports that he did not start the blood pressure medication that was prescribed at his last office visit a couple of weeks ago, states that he did pick up the medication, however his sister passed away and he simply did not start the medication.  Reports that he will occasionally check his blood pressure at work, but has not done this recently.  Denies any hypertensive symptoms.  States that he has been having pain when eating, states that he he had biopsy on his tongue completed November 7, at that time he was given Percocet with some relief but is out of the medication.  Reports that he has an appointment at the end of the week in [place] at [facility] for further evaluation of his tongue carcinoma.  States that he has taken ibuprofen 200 mg over-the-counter without any relief     History reviewed. No pertinent past medical history.  Past Surgical History:  Procedure Laterality Date  . DIRECT LARYNGOSCOPY N/A 09/05/2020   Procedure: DIRECT LARYNGOSCOPY;  Surgeon: Marcina Millard, MD;  Location: [facility];  Service: ENT;  Laterality: N/A;  . ESOPHAGOSCOPY N/A 09/05/2020   Procedure: ESOPHAGOSCOPY;  Surgeon: Marcina Millard, MD;  Location: [facility];  Service: ENT;  Laterality: N/A;  . FRACTURE SURGERY    . TONGUE BIOPSY N/A 09/05/2020   Procedure: TONGUE BIOPSY;  Surgeon: Marcina Millard, MD;  Location: [facility];  Service: ENT;  Laterality: N/A;    History reviewed. No pertinent family history.  Social History   Socioeconomic History  . Marital status: Single    Spouse name: Not on file  . Number of children: Not on file  . Years of education: Not on file  . Highest education level: Not on file  Occupational History  . Not on file  Tobacco Use  .  Smoking status: Current Every Day Smoker    Packs/day: 0.50    Types: Cigarettes  . Smokeless tobacco: Never Used  Substance and Sexual Activity  . Alcohol use: Yes  . Drug use: Yes    Types: Marijuana  . Sexual activity: Not Currently  Other Topics Concern  . Not on file  Social History Narrative  . Not on file   Social Determinants of Health   Financial Resource Strain:   . Difficulty of Paying Living Expenses: Not on file  Food Insecurity:   . Worried About Charity fundraiser in the Last Year: Not on file  . Ran Out of Food in the Last Year: Not on file  Transportation Needs:   . Lack of Transportation (Medical): Not on file  . Lack of Transportation (Non-Medical): Not on file  Physical Activity:   . Days of Exercise per Week: Not on file  . Minutes of Exercise per Session: Not on file  Stress:   . Feeling of Stress : Not on file  Social Connections:   . Frequency of Communication with Friends and Family: Not on file  . Frequency of Social Gatherings with Friends and Family: Not on file  . Attends Religious Services: Not on file  . Active Member of Clubs or Organizations: Not on file  . Attends Archivist Meetings: Not on file  . Marital Status:  Not on file  Intimate Partner Violence:   . Fear of Current or Ex-Partner: Not on file  . Emotionally Abused: Not on file  . Physically Abused: Not on file  . Sexually Abused: Not on file    Outpatient Medications Prior to Visit  Medication Sig Dispense Refill  . chlorthalidone (HYGROTON) 25 MG tablet Take 1 tablet (25 mg total) by mouth daily. 90 tablet 3  . oxyCODONE-acetaminophen (PERCOCET) 5-325 MG tablet Take 1-2 tablets by mouth every 4 (four) hours as needed for severe pain. (Patient not taking: Reported on 09/21/2020) 35 tablet 0   No facility-administered medications prior to visit.    No Known Allergies  ROS Review of Systems  Constitutional: Negative.   HENT: Positive for mouth sores.   Eyes:  Negative.   Respiratory: Negative.   Cardiovascular: Negative.   Gastrointestinal: Negative.   Endocrine: Negative.   Genitourinary: Negative.   Musculoskeletal: Negative.   Skin: Negative.   Allergic/Immunologic: Negative.   Neurological: Negative.   Hematological: Negative.   Psychiatric/Behavioral: Negative.       Objective:    Physical Exam Constitutional:      General: He is not in acute distress.    Appearance: Normal appearance. He is not ill-appearing.  HENT:     Head: Normocephalic and atraumatic.     Right Ear: External ear normal.     Left Ear: External ear normal.     Nose: Nose normal.     Mouth/Throat:     Mouth: Mucous membranes are moist.     Pharynx: Oropharynx is clear.  Eyes:     Extraocular Movements: Extraocular movements intact.     Conjunctiva/sclera: Conjunctivae normal.     Pupils: Pupils are equal, round, and reactive to light.  Cardiovascular:     Rate and Rhythm: Normal rate and regular rhythm.     Pulses: Normal pulses.     Heart sounds: Normal heart sounds.  Abdominal:     General: Abdomen is flat.     Palpations: Abdomen is soft.  Musculoskeletal:     Cervical back: Normal range of motion and neck supple.  Skin:    General: Skin is warm and dry.  Neurological:     General: No focal deficit present.     Mental Status: He is alert and oriented to person, place, and time.  Psychiatric:        Mood and Affect: Mood normal.        Behavior: Behavior normal.        Thought Content: Thought content normal.        Judgment: Judgment normal.     BP (!) 150/95 (BP Location: Right Arm, Patient Position: Sitting, Cuff Size: Normal)   Pulse (!) 113   Temp 98.7 F (37.1 C) (Oral)   Resp 18   Ht 5\' 9"  (1.753 m)   Wt 142 lb (64.4 kg)   SpO2 99%   BMI 20.97 kg/m  Wt Readings from Last 3 Encounters:  09/29/20 142 lb (64.4 kg)  09/21/20 147 lb 9.6 oz (67 kg)  11/09/18 170 lb (77.1 kg)     Health Maintenance Due  Topic Date Due  .  Hepatitis C Screening  Never done  . COVID-19 Vaccine (1) Never done  . TETANUS/TDAP  Never done    There are no preventive care reminders to display for this patient.  No results found for: TSH Lab Results  Component Value Date   WBC 8.0 09/05/2020   HGB  13.4 09/05/2020   HCT 41.3 09/05/2020   MCV 91.8 09/05/2020   PLT 241 09/05/2020   Lab Results  Component Value Date   NA 134 (L) 09/05/2020   K 3.4 (L) 09/05/2020   CO2 26 09/05/2020   GLUCOSE 108 (H) 09/05/2020   BUN 7 09/05/2020   CREATININE 0.77 09/05/2020   BILITOT 1.9 (H) 11/09/2018   ALKPHOS 73 11/09/2018   AST 57 (H) 11/09/2018   ALT 34 11/09/2018   PROT 8.6 (H) 11/09/2018   ALBUMIN 4.2 11/09/2018   CALCIUM 9.6 09/05/2020   ANIONGAP 10 09/05/2020   No results found for: CHOL No results found for: HDL No results found for: LDLCALC No results found for: TRIG No results found for: CHOLHDL No results found for: HGBA1C    Assessment & Plan:   Problem List Items Addressed This Visit      Digestive   Tongue carcinoma ([place])   Relevant Medications   acetaminophen-codeine (TYLENOL #3) 300-30 MG tablet   ibuprofen (ADVIL) 800 MG tablet    Other Visit Diagnoses    Essential hypertension    -  Primary   Relevant Orders   TSH   Vitamin D, 25-hydroxy   Screening, lipid       Relevant Orders   Lipid panel   Screening for HIV without presence of risk factors       Relevant Orders   HIV antibody (with reflex)   Encounter for HCV screening test for low risk patient       Relevant Orders   HCV Ab w/Rflx to Verification    1. Tongue carcinoma (HCC) Trial ibuprofen 800 mg every 8 hours as needed, Tylenol 3 for breakthrough pain, strongly encouraged patient to keep follow-up appointment at [facility] - acetaminophen-codeine (TYLENOL #3) 300-30 MG tablet; Take 1 tablet by mouth every 6 (six) hours as needed for up to 7 days for moderate pain.  Dispense: 28 tablet; Refill: 0 - ibuprofen (ADVIL) 800 MG tablet;  Take 1 tablet (800 mg total) by mouth every 8 (eight) hours as needed.  Dispense: 30 tablet; Refill: 0  2. Essential hypertension Encourage patient to begin previously prescribed medication, patient education given on low-sodium diet, increasing water intake, good sleep hygiene.  Encourage patient to check blood pressure on a daily basis, keep written log and bring to next office appointment.  Encourage patient to return to clinic in 2 weeks for blood pressure check and fasting labs.   I have reviewed the patient's medical history (PMH, PSH, Social History, Family History, Medications, and allergies) , and have been updated if relevant. I spent 30 minutes reviewing chart and  face to face time with patient.     Meds ordered this encounter  Medications  . acetaminophen-codeine (TYLENOL #3) 300-30 MG tablet    Sig: Take 1 tablet by mouth every 6 (six) hours as needed for up to 7 days for moderate pain.    Dispense:  28 tablet    Refill:  0    Order Specific Question:   Supervising Provider    Answer:   Joya Gaskins, PATRICK E [[id number]]  . ibuprofen (ADVIL) 800 MG tablet    Sig: Take 1 tablet (800 mg total) by mouth every 8 (eight) hours as needed.    Dispense:  30 tablet    Refill:  0    Order Specific Question:   Supervising Provider    Answer:   Elsie Stain [[id number]]    Follow-up: Return in about  2 weeks (around 10/13/2020).    Loraine Grip Mayers, PA-C

## 2020-09-29 NOTE — Patient Instructions (Signed)
I sent to your pharmacy ibuprofen 800 mg, you can use this to help with your tongue pain, and use Tylenol 3 for uncontrolled pain.  Please begin the medication for your elevated blood pressure, check your blood pressure on a daily basis, keep a written log and bring that log with you to your next office visit in 2 weeks.  You will also want to complete fasting labs at that time.  Please let us know if there is anything else we can do for you  Alex Rad, PA-C Physician Assistant [name] [email or web address]   Low-Sodium Eating Plan Sodium, which is an element that makes up salt, helps you maintain a healthy balance of fluids in your body. Too much sodium can increase your blood pressure and cause fluid and waste to be held in your body. Your health care provider or dietitian may recommend following this plan if you have high blood pressure (hypertension), kidney disease, liver disease, or heart failure. Eating less sodium can help lower your blood pressure, reduce swelling, and protect your heart, liver, and kidneys. What are tips for following this plan? General guidelines  Most people on this plan should limit their sodium intake to 1,500-2,000 mg (milligrams) of sodium each day. Reading food labels   The Nutrition Facts label lists the amount of sodium in one serving of the food. If you eat more than one serving, you must multiply the listed amount of sodium by the number of servings.  Choose foods with less than 140 mg of sodium per serving.  Avoid foods with 300 mg of sodium or more per serving. Shopping  Look for lower-sodium products, often labeled as "low-sodium" or "no salt added."  Always check the sodium content even if foods are labeled as "unsalted" or "no salt added".  Buy fresh foods. ? Avoid canned foods and premade or frozen meals. ? Avoid canned, cured, or processed meats  Buy breads that have less  than 80 mg of sodium per slice. Cooking  Eat more home-cooked food and less restaurant, buffet, and fast food.  Avoid adding salt when cooking. Use salt-free seasonings or herbs instead of table salt or sea salt. Check with your health care provider or pharmacist before using salt substitutes.  Cook with plant-based oils, such as canola, sunflower, or olive oil. Meal planning  When eating at a restaurant, ask that your food be prepared with less salt or no salt, if possible.  Avoid foods that contain MSG (monosodium glutamate). MSG is sometimes added to [place] food, bouillon, and some canned foods. What foods are recommended? The items listed may not be a complete list. Talk with your dietitian about what dietary choices are best for you. Grains Low-sodium cereals, including oats, puffed wheat and rice, and shredded wheat. Low-sodium crackers. Unsalted rice. Unsalted pasta. Low-sodium bread. Whole-grain breads and whole-grain pasta. Vegetables Fresh or frozen vegetables. "No salt added" canned vegetables. "No salt added" tomato sauce and paste. Low-sodium or reduced-sodium tomato and vegetable juice. Fruits Fresh, frozen, or canned fruit. Fruit juice. Meats and other protein foods Fresh or frozen (no salt added) meat, poultry, seafood, and fish. Low-sodium canned tuna and salmon. Unsalted nuts. Dried peas, beans, and lentils without added salt. Unsalted canned beans. Eggs. Unsalted nut butters. Dairy Milk. Soy milk. Cheese that is naturally low in sodium, such as ricotta cheese, fresh mozzarella, or Swiss cheese Low-sodium or reduced-sodium cheese. Cream cheese. Yogurt. Fats and oils Unsalted butter. Unsalted margarine with no trans fat. Vegetable  oils such as canola or olive oils. Seasonings and other foods Fresh and dried herbs and spices. Salt-free seasonings. Low-sodium mustard and ketchup. Sodium-free salad dressing. Sodium-free light mayonnaise. Fresh or refrigerated horseradish.  Lemon juice. Vinegar. Homemade, reduced-sodium, or low-sodium soups. Unsalted popcorn and pretzels. Low-salt or salt-free chips. What foods are not recommended? The items listed may not be a complete list. Talk with your dietitian about what dietary choices are best for you. Grains Instant hot cereals. Bread stuffing, pancake, and biscuit mixes. Croutons. Seasoned rice or pasta mixes. Noodle soup cups. Boxed or frozen macaroni and cheese. Regular salted crackers. Self-rising flour. Vegetables Sauerkraut, pickled vegetables, and relishes. Olives. [place] fries. Onion rings. Regular canned vegetables (not low-sodium or reduced-sodium). Regular canned tomato sauce and paste (not low-sodium or reduced-sodium). Regular tomato and vegetable juice (not low-sodium or reduced-sodium). Frozen vegetables in sauces. Meats and other protein foods Meat or fish that is salted, canned, smoked, spiced, or pickled. Bacon, ham, sausage, hotdogs, corned beef, chipped beef, packaged lunch meats, salt pork, jerky, pickled herring, anchovies, regular canned tuna, sardines, salted nuts. Dairy Processed cheese and cheese spreads. Cheese curds. Blue cheese. Feta cheese. String cheese. Regular cottage cheese. Buttermilk. Canned milk. Fats and oils Salted butter. Regular margarine. Ghee. Bacon fat. Seasonings and other foods Onion salt, garlic salt, seasoned salt, table salt, and sea salt. Canned and packaged gravies. Worcestershire sauce. Tartar sauce. Barbecue sauce. Teriyaki sauce. Soy sauce, including reduced-sodium. Steak sauce. Fish sauce. Oyster sauce. Cocktail sauce. Horseradish that you find on the shelf. Regular ketchup and mustard. Meat flavorings and tenderizers. Bouillon cubes. Hot sauce and Tabasco sauce. Premade or packaged marinades. Premade or packaged taco seasonings. Relishes. Regular salad dressings. Salsa. Potato and tortilla chips. Corn chips and puffs. Salted popcorn and pretzels. Canned or dried soups.  Pizza. Frozen entrees and pot pies. Summary  Eating less sodium can help lower your blood pressure, reduce swelling, and protect your heart, liver, and kidneys.  Most people on this plan should limit their sodium intake to 1,500-2,000 mg (milligrams) of sodium each day.  Canned, boxed, and frozen foods are high in sodium. Restaurant foods, fast foods, and pizza are also very high in sodium. You also get sodium by adding salt to food.  Try to cook at home, eat more fresh fruits and vegetables, and eat less fast food, canned, processed, or prepared foods. This information is not intended to replace advice given to you by your health care provider. Make sure you discuss any questions you have with your health care provider. Document Revised: 09/28/2017 Document Reviewed: 10/09/2016 Elsevier Patient Education  [place].   Blood Pressure Record Sheet To take your blood pressure, you will need a blood pressure machine. You can buy a blood pressure machine (blood pressure monitor) at your clinic, drug store, or online. When choosing one, consider:  An automatic monitor that has an arm cuff.  A cuff that wraps snugly around your upper arm. You should be able to fit only one finger between your arm and the cuff.  A device that stores blood pressure reading results.  Do not choose a monitor that measures your blood pressure from your wrist or finger. Follow your health care provider's instructions for how to take your blood pressure. To use this form:  Get one reading in the morning (a.m.) before you take any medicines.  Get one reading in the evening (p.m.) before supper.  Take at least 2 readings with each blood pressure check. This makes sure  the results are correct. Wait 1-2 minutes between measurements.  Write down the results in the spaces on this form.  Repeat this once a week, or as told by your health care provider.  Make a follow-up appointment with your health care  provider to discuss the results. Blood pressure log Date: _______________________  a.m. _____________________(1st reading) _____________________(2nd reading)  p.m. _____________________(1st reading) _____________________(2nd reading) Date: _______________________  a.m. _____________________(1st reading) _____________________(2nd reading)  p.m. _____________________(1st reading) _____________________(2nd reading) Date: _______________________  a.m. _____________________(1st reading) _____________________(2nd reading)  p.m. _____________________(1st reading) _____________________(2nd reading) Date: _______________________  a.m. _____________________(1st reading) _____________________(2nd reading)  p.m. _____________________(1st reading) _____________________(2nd reading) Date: _______________________  a.m. _____________________(1st reading) _____________________(2nd reading)  p.m. _____________________(1st reading) _____________________(2nd reading) This information is not intended to replace advice given to you by your health care provider. Make sure you discuss any questions you have with your health care provider. Document Revised: 12/14/2017 Document Reviewed: 10/16/2017 Elsevier Patient Education  [place].

## 2020-09-29 NOTE — Progress Notes (Signed)
Patient has eaten today and patient has not taken medication today. Patients sister passed last week and patient has not began BP medication but did pick them up. Patient complains of pain in the tongue and side of the mouth which has been addressed. Patient denies dizziness and nagging HA.

## 2020-09-30 ENCOUNTER — Encounter: Payer: Self-pay | Admitting: Physician Assistant

## 2020-10-05 ENCOUNTER — Other Ambulatory Visit (HOSPITAL_COMMUNITY): Payer: Self-pay | Admitting: Otolaryngology

## 2020-10-05 DIAGNOSIS — C029 Malignant neoplasm of tongue, unspecified: Secondary | ICD-10-CM

## 2020-10-12 ENCOUNTER — Ambulatory Visit (HOSPITAL_COMMUNITY)
Admission: RE | Admit: 2020-10-12 | Discharge: 2020-10-12 | Disposition: A | Discharge status: Home / Self Care | Payer: Medicaid Other | Source: Ambulatory Visit | Attending: Otolaryngology | Admitting: Otolaryngology

## 2020-10-12 DIAGNOSIS — C029 Malignant neoplasm of tongue, unspecified: Secondary | ICD-10-CM | POA: Insufficient documentation

## 2020-10-12 IMAGING — MR MR NECK WO/W CM
9 series · 48 of 48 positions shown · IV contrast (gadavist)
Comparison: [DATE] CT

CLINICAL DATA: Carcinoma of the tongue. Pain and numbness.
Well-differentiated squamous cell carcinoma.

EXAM:
MRI OF THE NECK WITH CONTRAST
TECHNIQUE: Multiplanar, multisequence MR imaging was performed following the
administration of intravenous contrast.
CONTRAST:  6mL GADAVIST GADOBUTROL 1 MMOL/ML IV SOLN

[Series 6: T1 · sagittal · 5.0mm · 0.75mm/px · 6 of 26 slices shown (1 of 3)]
[im 1/26]
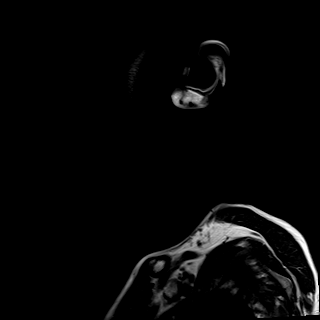
[im 6/26]
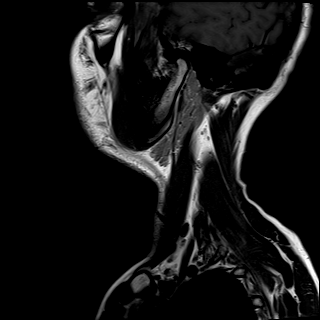
[im 11/26]
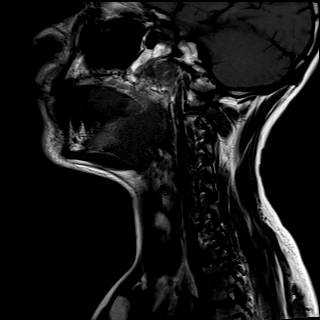
[im 16/26]
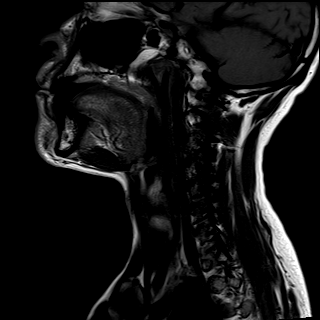
[im 21/26]
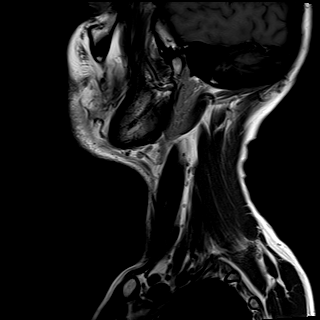
[im 26/26]
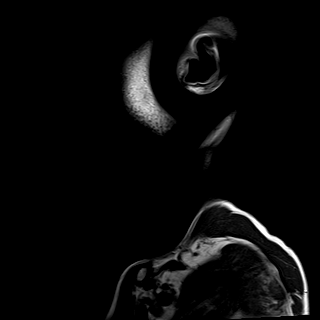

[Series 7: T1 · axial · 5.0mm · 0.75mm/px · z∈[-122,+37]mm · 6 of 28 slices shown (2 of 3)]
[im 1/28]
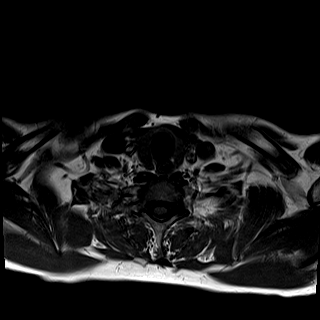
[im 6/28]
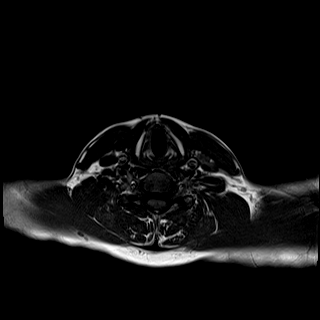
[im 11/28]
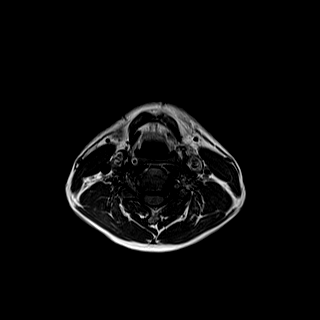
[im 17/28]
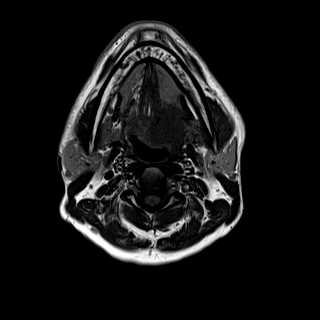
[im 22/28]
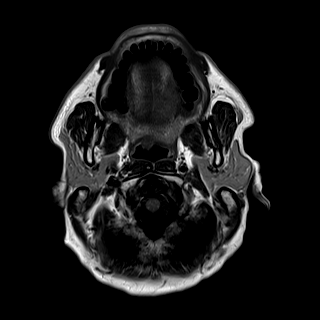
[im 28/28]
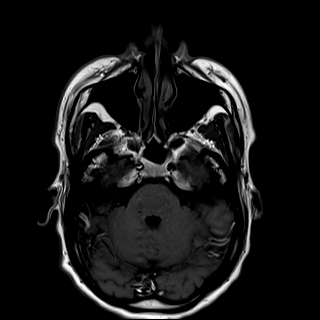

[Series 9: T2 fat-sat · coronal · 5.0mm · 0.94mm/px · 6 of 28 slices shown (1 of 3)]
[im 1/28]
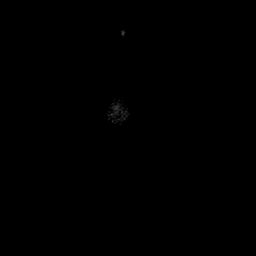
[im 6/28]
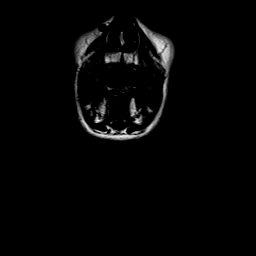
[im 11/28]
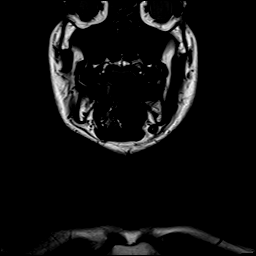
[im 17/28]
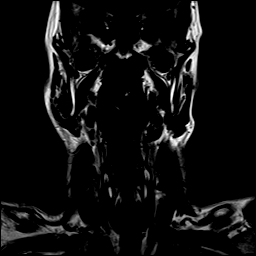
[im 22/28]
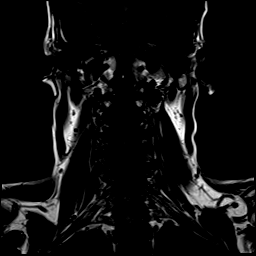
[im 28/28]
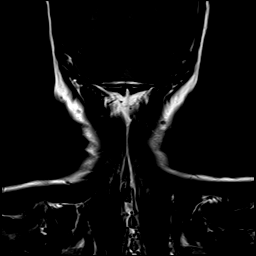

[Series 10: T1 · coronal · 5.0mm · 0.75mm/px · 5 of 28 slices shown (3 of 3)]
[im 1/28]
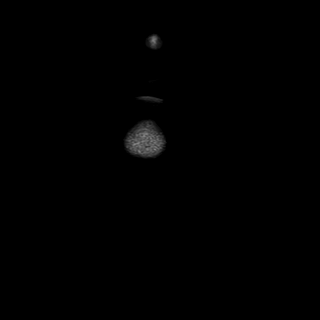
[im 7/28]
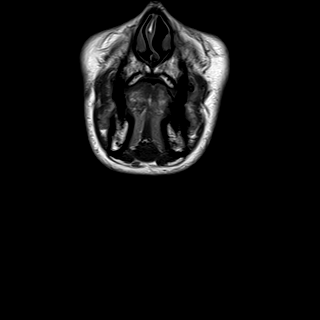
[im 14/28]
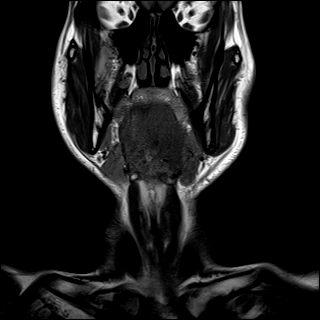
[im 21/28]
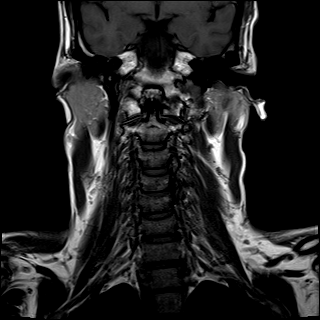
[im 28/28]
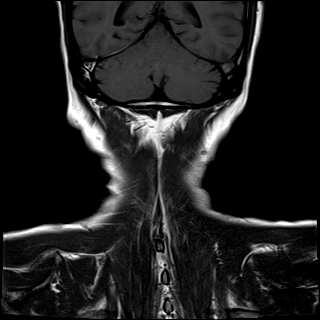

[Series 12: T2 fat-sat · sagittal · 5.0mm · 0.94mm/px · 5 of 26 slices shown (2 of 3)]
[im 1/26]
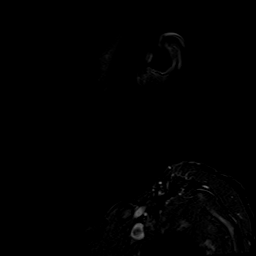
[im 7/26]
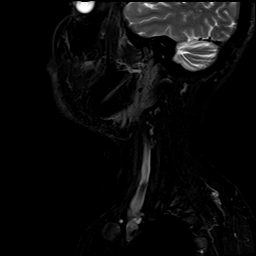
[im 13/26]
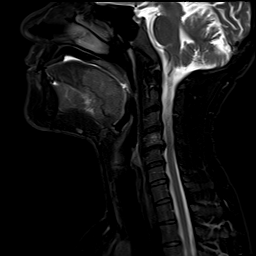
[im 19/26]
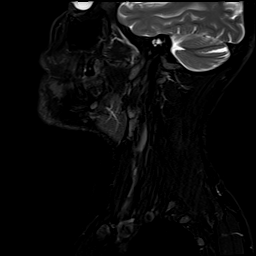
[im 26/26]
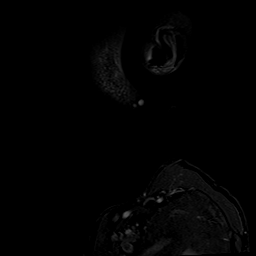

[Series 14: T2 fat-sat · axial · 5.0mm · 0.94mm/px · z∈[-122,+37]mm · 5 of 28 slices shown (3 of 3)]
[im 1/28]
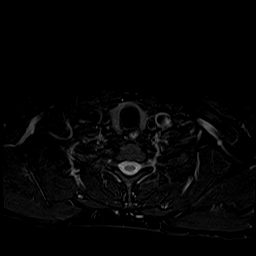
[im 7/28]
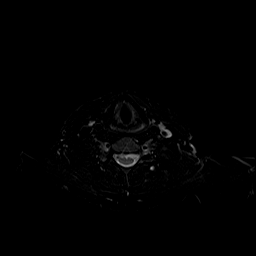
[im 14/28]
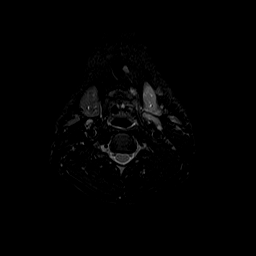
[im 21/28]
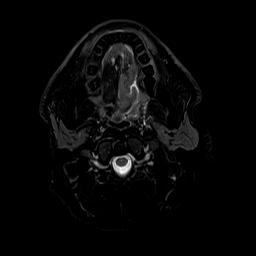
[im 28/28]
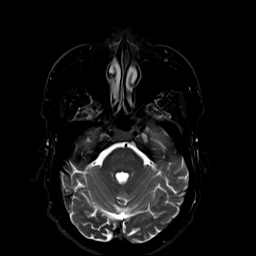

[Series 15: T1 fat-sat post-contrast · coronal · 5.0mm · 0.75mm/px · 5 of 28 slices shown (1 of 3)]
[im 1/28]
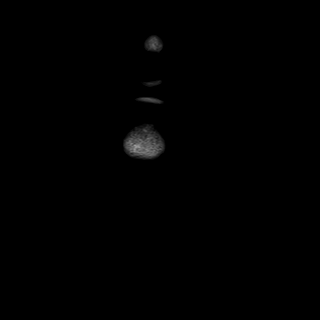
[im 7/28]
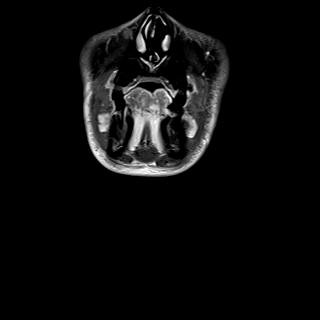
[im 14/28]
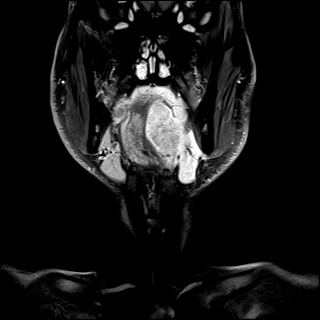
[im 21/28]
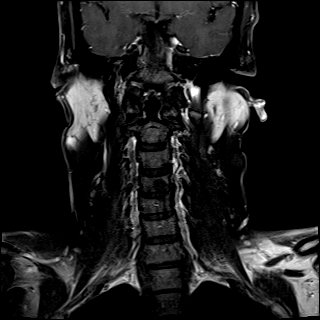
[im 28/28]
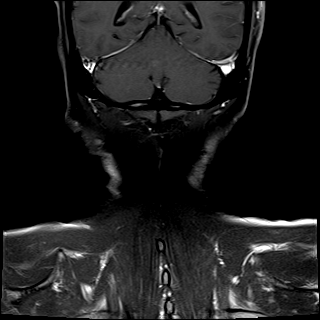

[Series 16: T1 fat-sat post-contrast · axial · 5.0mm · 0.75mm/px · z∈[-122,+37]mm · 5 of 28 slices shown (2 of 3)]
[im 1/28]
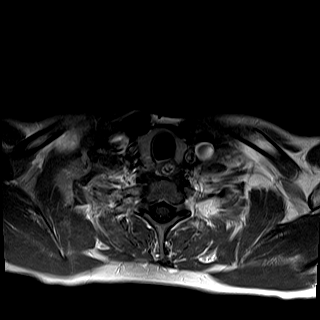
[im 7/28]
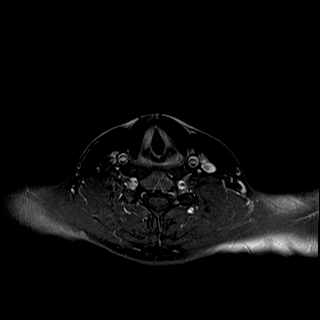
[im 14/28]
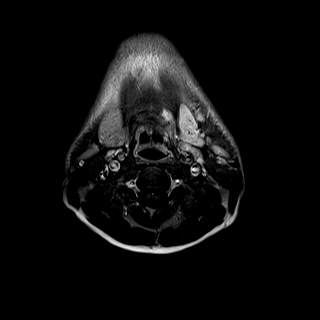
[im 21/28]
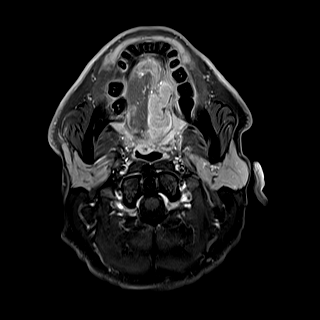
[im 28/28]
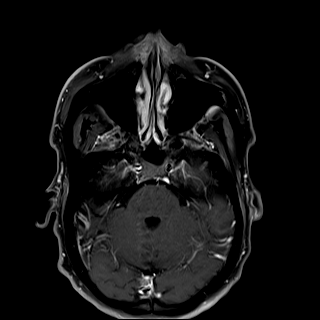

[Series 17: T1 fat-sat post-contrast · sagittal · 5.0mm · 0.75mm/px · 5 of 26 slices shown (3 of 3)]
[im 1/26]
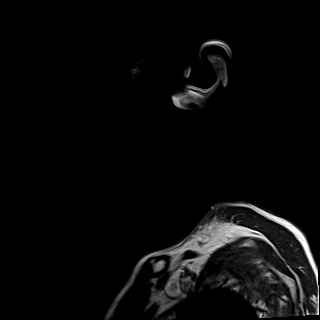
[im 7/26]
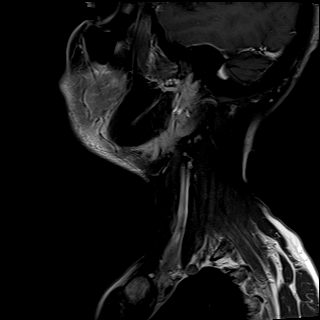
[im 13/26]
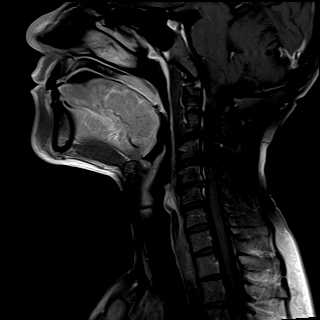
[im 19/26]
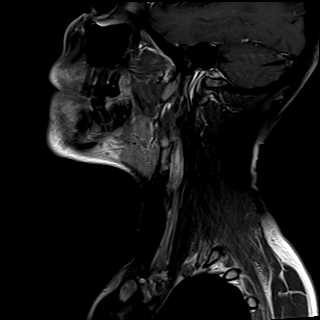
[im 26/26]
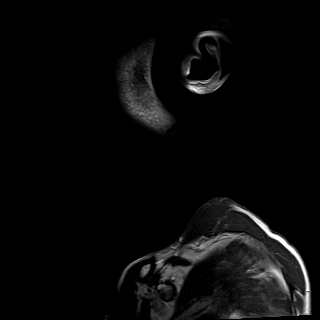

[48 of 48 positions shown; findings below may reference images not displayed]

FINDINGS: Pharynx and larynx: As seen on the previous CT, there is a large
mass within the tongue apparently restricted to the left side,
without evidence of crossing of the tongue midline. The lesion in
total measures about 7 cm from front to back. Lesion extends all the
way from the superior surface of the anterior tongue back to the
posterior tongue and base of the on the left. Tumor bulges into the
sublingual region on the left. There is edematous change of the
adjacent soft tissues, questionably secondary to regional radiation.
Invasion of the left submandibular gland is not demonstrated. Some
submandibular edema probably relates to radiation.

Salivary glands: See above.  Parotid glands are normal.

Thyroid: Normal

Lymph nodes: As seen on the previous CT, there is no evidence of
cervical lymphadenopathy. Mild edema and enhancement of level 2
nodes on the left likely related to radiation. These nodes do not
show significant asymmetry with the other side, short axis dimension
greater than 8 mm or necrosis.

Vascular: Normal

Limited intracranial: Normal

Visualized orbits: Not included

Mastoids and visualized paranasal sinuses: Clear

Skeleton: Within normal limits, with ordinary mild midcervical
spondylosis.

Upper chest: Negative as seen in a limited fashion.

Other: None
IMPRESSION: Large mass within the tongue apparently restricted to the left side,
without evidence of crossing of the tongue midline. Lesion in total
measures about 7 cm from front to back. Lesion extends all the way
from the superior surface of the anterior tongue back to the
posterior tongue and base of the left. Tumor bulges into the
sublingual region on the left. This appears very similar to the
previous CT, as best as the 2 techniques can be compared. Invasion
of the left submandibular gland is not demonstrated. There is
edematous change of the adjacent soft tissues, questionably
secondary to regional radiation. Mild edema and enhancement of level
2 nodes on the left likely related to radiation. No evidence of
cervical lymph node enlargement or necrosis.

## 2020-10-12 MED ORDER — GADOBUTROL 1 MMOL/ML IV SOLN
6.0000 mL | Freq: Once | INTRAVENOUS | Status: AC | PRN
  Administered 2020-10-12: 19:00:00 6 mL via INTRAVENOUS

## 2020-10-12 NOTE — Progress Notes (Signed)
   S:    Patient arrives in good spirits. Presents to the clinic for hypertension evaluation, counseling, and management. Patient was referred and last seen by Carrolyn Meiers on 09/29/20. At that visit, BP was elevated at 150/95 (HR 113) and patient never started previously prescribed chlorthalidone 25 mg daily. Patient instructed to start this medication.   Today, patient reports overall medication adherence with chlorthalidone in the mornings. Missed 1 dose in the past two weeks. Denies any side effects. Denies dizziness, headaches, blurred vision, and LE swelling. Does not have a BP machine to check BP at home. Reports smoking 2 cigarettes 20 minutes ago.  Medication adherence reported.  Current BP Medications include:  Chlorthalidone 25 mg daily (AM)  Antihypertensives tried in the past include: none  Dietary habits include:  -Breakfast: eggs, potatoes -Lunch: chicken alfredo -Dinner - uses salt to cook at times  Exercise habits include: none  Family / Social history:  -Fhx: none -Tobacco use: tobacco smoker - 0.5 ppd  O:  Vitals:   10/13/20 1559  BP: (!) 156/103  Pulse: (!) 114   Home BP readings: none  Last 3 Office BP readings: BP Readings from Last 3 Encounters:  10/13/20 (!) 156/103  09/29/20 (!) 150/95  09/21/20 (!) 165/99    BMET    Component Value Date/Time   NA 134 (L) 09/05/2020 1416   K 3.4 (L) 09/05/2020 1416   CL 98 09/05/2020 1416   CO2 26 09/05/2020 1416   GLUCOSE 108 (H) 09/05/2020 1416   BUN 7 09/05/2020 1416   CREATININE 0.77 09/05/2020 1416   CALCIUM 9.6 09/05/2020 1416   GFRNONAA >60 09/05/2020 1416   GFRAA >60 11/09/2018 1126    Renal function: CrCl cannot be calculated (Patient's most recent lab result is older than the maximum 21 days allowed.).  Clinical ASCVD: No  The ASCVD Risk score Mikey Bussing DC Jr., et al., 2013) failed to calculate for the following reasons:   Cannot find a previous HDL lab   Cannot find a previous total cholesterol  lab  A/P: Hypertension longstanding currently uncontrolled on current medications. BP Goal = <130/80 mmHg. Medication adherence appears optimal. Encouraged patient to aim for a diet full of vegetables, fruit and lean meats (chicken, [place], fish), and to limit salt intake by eating fresh or frozen vegetables (instead of canned), rinse canned vegetables prior to cooking and do not add any additional salt to meals. Encouraged patient to exercise for at 20-30 minutes daily with the goal of 150 minutes per week. Patient verbalized understanding. -Started amlodipine 5 mg daily -Continued chlorthalidone 25 mg daily -Counseled on lifestyle modifications for blood pressure control including reduced dietary sodium, increased exercise, adequate sleep.  Results reviewed and written information provided. Total time in face-to-face counseling 25 minutes.   F/U Clinic Visit in 4 weeks.   Lorel Monaco, PharmD, [place] PGY2 Ambulatory Care Resident [place]

## 2020-10-13 ENCOUNTER — Other Ambulatory Visit: Payer: Self-pay

## 2020-10-13 ENCOUNTER — Encounter: Payer: Self-pay | Admitting: Pharmacist

## 2020-10-13 ENCOUNTER — Ambulatory Visit: Payer: Self-pay | Attending: Family Medicine | Admitting: Pharmacist

## 2020-10-13 VITALS — BP 156/103 | HR 114

## 2020-10-13 DIAGNOSIS — I10 Essential (primary) hypertension: Secondary | ICD-10-CM

## 2020-10-13 MED ORDER — AMLODIPINE BESYLATE 5 MG PO TABS: 5.0000 mg | ORAL_TABLET | Freq: Every day | ORAL | 3 refills | Status: DC

## 2020-10-15 DIAGNOSIS — I10 Essential (primary) hypertension: Secondary | ICD-10-CM | POA: Insufficient documentation

## 2020-10-25 ENCOUNTER — Telehealth: Payer: Self-pay

## 2020-10-25 NOTE — Telephone Encounter (Deleted)
Patient is needed appt scheduled after PCP is assigned. Please advise CB- [phone]

## 2020-10-25 NOTE — Telephone Encounter (Addendum)
Patient aunt BARBETTE Fini is calling to get the patient to a regular PCP. Patient has been seeing temporary providers. Please assign PCP. Afterwards an appt is needed Cb- [phone]

## 2020-11-02 ENCOUNTER — Encounter: Payer: Self-pay | Admitting: Family

## 2020-11-02 ENCOUNTER — Other Ambulatory Visit: Payer: Self-pay

## 2020-11-02 VITALS — BP 92/65 | HR 110 | Wt 127.2 lb

## 2020-11-02 DIAGNOSIS — Z23 Encounter for immunization: Secondary | ICD-10-CM

## 2020-11-02 DIAGNOSIS — K5900 Constipation, unspecified: Secondary | ICD-10-CM

## 2020-11-02 DIAGNOSIS — Z748 Other problems related to care provider dependency: Secondary | ICD-10-CM

## 2020-11-02 DIAGNOSIS — Z7689 Persons encountering health services in other specified circumstances: Secondary | ICD-10-CM

## 2020-11-02 DIAGNOSIS — C029 Malignant neoplasm of tongue, unspecified: Secondary | ICD-10-CM

## 2020-11-02 DIAGNOSIS — I10 Essential (primary) hypertension: Secondary | ICD-10-CM

## 2020-11-02 NOTE — Progress Notes (Addendum)
Subjective:    [name] - 45 y.o. male MRN [id number]  Date of birth: [birth year]  HPI  [name] is to establish care. Patient has a PMH significant for hypertension, tongue carcinoma, squamous cell cancer of tongue, and daily consumption of alcohol. Patient accompanied by his aunt, [name], who serves as patient support and part-historian.  Patient admitted from 10/15/2020 - 10/25/2020 at the [facility] with principal problems of tongue cancer, squamous cell cancer of tongue, and hypertension. Brief overview of procedures and history during admission included 18 French gastrostomy tube placement, total glossectomy, tracheostomy 6CFS Shiley placement, neck dissection modified radical bilateral neck, pharyngectomy, free flap lateral/arm lower, mandibulotomy, and dental extractions. Please refer to 10/15/2020 note for detailed course of hospital stay and plan of care. At time of discharge patient was stable, JP drains, and staples removed. Blood pressure was low-normal and required cessation of his home antihypertensive.   Today patient says he is feeling well but still having some complications related to constipation and Miralax helped in the past. Aunt states that patient has about 3 of the prescribed protein supplements daily instead of the recommended 5 because it seems to be too much for him at times. Has not received trach care since hospital discharge. Aunt reports they were unable to make the follow-up appointment with ENT on yesterday related to inclement weather. Says she has plans to follow-up within the next few days to reschedule. Also, requesting transportation assistance so that patient may be able to get safely to and from appointments.   ROS per HPI    Health Maintenance:  - Tdap vaccine declined Health Maintenance Due  Topic Date Due  . Hepatitis C Screening  Never done  . COVID-19 Vaccine (1) Never done  . TETANUS/TDAP  Never done  .  COLONOSCOPY (Pts 45-31yrs Insurance coverage will need to be confirmed)  Never done    Past Medical History: Patient Active Problem List   Diagnosis Date Noted  . HTN (hypertension) 10/15/2020  . Influenza vaccine refused 09/21/2020  . Hypertension 09/21/2020  . Daily consumption of alcohol 09/13/2020  . Squamous cell cancer of tongue (HCC) 09/13/2020  . Tongue carcinoma [facility])       Social History   reports that he has quit smoking. His smoking use included cigarettes. He smoked 0.50 packs per day. He has never used smokeless tobacco. He reports previous alcohol use. He reports current drug use. Drug: Marijuana.   Family History  family history includes Lung cancer in his mother; Stroke in his father.   Medications: reviewed and updated   Objective:   Physical Exam BP 92/65 (BP Location: Left Arm, Patient Position: Sitting)   Pulse (!) 110   Wt 127 lb 3.2 oz (57.7 kg)   SpO2 99%   BMI 18.78 kg/m  Physical Exam HENT:     Mouth/Throat:     Comments: Surgically reconstructed tongue present, endentulous.  Eyes:     Extraocular Movements: Extraocular movements intact.     Pupils: Pupils are equal, round, and reactive to light.  Neck:     Trachea: Tracheostomy present.  Cardiovascular:     Rate and Rhythm: Tachycardia present.     Pulses: Normal pulses.     Heart sounds: Normal heart sounds.  Pulmonary:     Effort: Pulmonary effort is normal.     Breath sounds: Normal breath sounds.  Abdominal:     Comments: G-tube placement of abdomen; two incisions clean, dry,  and intact with surgical glue.  Musculoskeletal:     Cervical back: Normal range of motion and neck supple.  Neurological:     Mental Status: He is alert.     Assessment & Plan:  1. Encounter to establish care: - Patient presents today to establish care.  - Return in 4 to 6 weeks or sooner if needed for annual physical examination, labs, and health maintenance. Arrive fasting meaning having had no food  and/or nothing to drink for at least 8 hours prior to appointment.  2. Essential hypertension: - Blood pressure low-normal. Previously taking Chlorthalidone daily, medication was held while admitted to hospital.  - Today blood pressure remains low-normal, will continue to hold Chlorthalidone for now.  - Follow-up with primary provider at next visit or sooner if needed.  3. Squamous cell cancer of tongue ([place]): 4. Tongue carcinoma [facility]): - Patient admitted from 10/15/2020 - 10/25/2020 at the [facility] with principal problems of tongue cancer, squamous cell cancer of tongue, and hypertension. Brief overview of procedures and history during admission included 18 French gastrostomy tube placement, total glossectomy, tracheostomy 6CFS Shiley placement, neck dissection modified radical bilateral neck, pharyngectomy, free flap lateral/arm lower, mandibulotomy, and dental extractions. Please refer to 10/15/2020 note for detailed course of hospital stay and plan of care. At time of discharge patient was stable, JP drains, and staples removed.  - Keep all appointments with Otolaryngology and ENT/Head/Neck Surgery as scheduled.  5. Constipation, unspecified constipation type: - Continue Polyethylene Glycol Powder as prescribed per gastrostomy tube. Medication available on file. - Follow-up with primary provider as needed.  6. Assistance needed with transportation: - Requesting transportation assistance to [place] appointments.  - Referral to RN case management for further assistance and community resources. - Consult to care management  7. Need for tetanus, diphtheria, and acellular pertussis (Tdap) vaccine: - Declines Tdap vaccine during today's visit.   Durene Fruits, NP 11/02/2020, 3:19 PM Primary Care at [facility]

## 2020-11-02 NOTE — Progress Notes (Signed)
Establish care Upset stomach

## 2020-11-02 NOTE — Patient Instructions (Addendum)
   Return in 4 to 6 weeks or sooner if needed for annual physical examination, labs, and health maintenance. Arrive fasting meaning having had no food and/or nothing to drink for at least 8 hours prior to appointment.  Miralax for constipation.   Keep all appointments with ENT/Head/Neck Surgery.   Referral for patient transportation assistance.  Thank you for choosing Primary Care at [facility] for your medical home!    [name] was seen by Rema Fendt, NP today.   Rosie Fate primary care provider is Nasier Thumm Jodi Geralds, NP.   For the best care possible,  you should try to see Ricky Stabs, NP whenever you come to clinic.   We look forward to seeing you again soon!  If you have any questions about your visit today,  please call us at [phone]  Or feel free to reach your provider via MyChart.     Constipation, Adult Constipation is when a person:  Poops (has a bowel movement) fewer times in a week than normal.  Has a hard time pooping.  Has poop that is dry, hard, or bigger than normal. Follow these instructions at home: Eating and drinking   Eat foods that have a lot of fiber, such as: ? Fresh fruits and vegetables. ? Whole grains. ? Beans.  Eat less of foods that are high in fat, low in fiber, or overly processed, such as: ? [place] fries. ? Hamburgers. ? Cookies. ? Candy. ? Soda.  Drink enough fluid to keep your pee (urine) clear or pale yellow. General instructions  Exercise regularly or as told by your doctor.  Go to the restroom when you feel like you need to poop. Do not hold it in.  Take over-the-counter and prescription medicines only as told by your doctor. These include any fiber supplements.  Do pelvic floor retraining exercises, such as: ? Doing deep breathing while relaxing your lower belly (abdomen). ? Relaxing your pelvic floor while pooping.  Watch your condition for any changes.  Keep all follow-up visits as told by your  doctor. This is important. Contact a doctor if:  You have pain that gets worse.  You have a fever.  You have not pooped for 4 days.  You throw up (vomit).  You are not hungry.  You lose weight.  You are bleeding from the anus.  You have thin, pencil-like poop (stool). Get help right away if:  You have a fever, and your symptoms suddenly get worse.  You leak poop or have blood in your poop.  Your belly feels hard or bigger than normal (is bloated).  You have very bad belly pain.  You feel dizzy or you faint. This information is not intended to replace advice given to you by your health care provider. Make sure you discuss any questions you have with your health care provider. Document Revised: 09/28/2017 Document Reviewed: 04/05/2016 Elsevier Patient Education  2020 [employer].

## 2020-11-02 NOTE — Addendum Note (Signed)
Addended by: Rema Fendt on: 11/02/2020 09:59 PM   Modules accepted: Orders

## 2020-11-02 NOTE — Progress Notes (Addendum)
Patient did not show for appointment.   

## 2020-11-02 NOTE — Addendum Note (Signed)
Addended by: Rema Fendt on: 11/02/2020 03:46 PM   Modules accepted: Level of Service

## 2020-11-02 NOTE — Addendum Note (Signed)
Addended by: Rema Fendt on: 11/02/2020 03:33 PM   Modules accepted: Orders

## 2020-11-05 ENCOUNTER — Telehealth: Payer: Self-pay

## 2020-11-05 NOTE — Telephone Encounter (Signed)
Called pt unable to understand due to

## 2020-11-05 NOTE — Telephone Encounter (Signed)
Called pt as suggested by Raynelle Fanning, in need on talking with a nurse. Spoke with pt unable to understand due to dysphasia.  No DPR on file , Instructed pt if in need of immediate medical  Attention experiencing, SOB, diff breathing, chest pain or bleeding  to walk in and be further evaluated in the  [facility]/ ED  . Pt agreed.

## 2020-11-08 ENCOUNTER — Telehealth: Payer: Self-pay

## 2020-11-08 NOTE — Telephone Encounter (Signed)
Received refill request via fax for Tylenol #3, pls advise

## 2020-11-08 NOTE — Telephone Encounter (Signed)
ATC pt to inform of provider's advice, no answer, UTLM, no VM set-up, will follow-up.

## 2020-11-08 NOTE — Telephone Encounter (Signed)
He has/ had appt with cancer provider today, he should obtain refill from them.  Thanks!

## 2020-11-09 NOTE — Telephone Encounter (Signed)
ATC, no answer, UTLM, no VM set up

## 2020-11-10 ENCOUNTER — Ambulatory Visit: Payer: Self-pay | Admitting: Pharmacist

## 2020-11-10 ENCOUNTER — Telehealth: Payer: Self-pay | Admitting: Radiation Oncology

## 2020-11-10 NOTE — Telephone Encounter (Signed)
We received a referral from [facility]/Dr. Conley Canal for a radiation consult. I called patient and spoke with his Elenor Legato, Irwin Brakeman, who explained that they decided today that pt will have chemo and radiation at [facility]. I have let Dr. Isidore Moos know.

## 2020-11-11 ENCOUNTER — Telehealth: Payer: Self-pay

## 2020-11-11 NOTE — Telephone Encounter (Signed)
Patient paperwork to be completed by PCP.

## 2020-11-11 NOTE — Telephone Encounter (Signed)
Paperwork gibven to provider 1/14

## 2020-11-16 ENCOUNTER — Telehealth: Payer: Self-pay | Admitting: Hematology and Oncology

## 2020-11-16 NOTE — Telephone Encounter (Signed)
Received a new pt referral from Dr. 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 [name] has been cld and scheduled to see Dr. Chryl Heck on 1/26 at 1120am. Aware to arrive 20 minutes early.

## 2020-11-17 ENCOUNTER — Telehealth: Payer: Self-pay | Admitting: Radiation Oncology

## 2020-11-17 NOTE — Telephone Encounter (Signed)
Gave good faith estimate on consult with Dr. Isidore Moos for 1/25 at 8:30a of $73.10. Barbette, aunt of patient, says that she is on disability herself so that they are needing assistance. I've sent a message to Dr. Isidore Moos and nurses to see what we can do.

## 2020-11-19 NOTE — Progress Notes (Signed)
Head and Neck Cancer Location of Tumor / Histology:  Squamous cell carcinoma of tongue  Patient presented with symptoms of: left sided tongue sore first noted by patient ~September 2021. Initially thought this was a dental issue. Initial evaluation per patient delayed by COVID-19 pandemic. Noted enlarging mass of left tongue. Reports left tongue pain, especially with eating. Occasional left ear pain.  MRI Neck w w/o Contrast 10/12/2020 IMPRESSION: Large mass within the tongue apparently restricted to the left side, without evidence of crossing of the tongue midline. Lesion in total measures about 7 cm from front to back. Lesion extends all the way from the superior surface of the anterior tongue back to the posterior tongue and base of the left. Tumor bulges into the sublingual region on the left. This appears very similar to the previous CT, as best as the 2 techniques can be compared. Invasion of the left submandibular gland is not demonstrated. There is edematous change of the adjacent soft tissues, questionably secondary to regional radiation. Mild edema and enhancement of level 2 nodes on the left likely related to radiation. No evidence of cervical lymph node enlargement or necrosis.  Biopsies revealed:  09/05/2020 FINAL MICROSCOPIC DIAGNOSIS:  A. TONGUE, LEFT BASE, BIOPSY:  - Invasive squamous cell carcinoma.  COMMENT:  There is no perineural or lymphovascular invasion. The carcinoma appears well differentiated ADDENDUM:  P16 is only focally and weakly positive in the invasive tumor (<5%).  Nutrition Status Yes No Comments  Weight changes? []  [x]    Swallowing concerns? [x]  []    PEG? [x]  []     Referrals Yes No Comments  Social Work? [x]  []    Dentistry? [x]  []    Swallowing therapy? [x]  []    Nutrition? [x]  []    Med/Onc? [x]  []  Consult with Dr. Chryl Heck 11/24/2020   Safety Issues Yes No Comments  Prior radiation? []  [x]    Pacemaker/ICD? []  [x]    Possible current pregnancy? []   [x]  N/A  Is the patient on methotrexate? []  [x]     Tobacco/Marijuana/Snuff/ETOH use:  Former smoker (Quit in 07/2020, history of 0.7 pack/day for ~22 years). Denies any current alcohol or recreational drug use  Past/Anticipated interventions by otolaryngology, if any:  09/05/2020 Dr. Elba Barman --DIRECT LARYNGOSCOPY  --TONGUE BIOPSY  --ESOPHAGOSCOPY   Past/Anticipated interventions by medical oncology, if any:  Scheduled for consult with Dr. Benay Pike on 11/24/2020   Current Complaints / other details:   Patient has not received COVID vaccines, did receive annual flu shot December 2021

## 2020-11-22 NOTE — Progress Notes (Signed)
Radiation Oncology         [phone] ________________________________  Initial Outpatient Consultation  Name: LORENZ KERSTETTER MRN: [id number]  Date: 11/23/2020  DOB: November 03, 1975  CC:Pcp, No  Wendall Mola, *   REFERRING PHYSICIAN: Wendall Mola, *  DIAGNOSIS: C02.3    ICD-10-CM   1. Squamous cell cancer of tongue (HCC)  C02.9   2. 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 male who presented to the ED on 09/05/2020 for evaluation of left lower tongue/dental/jaw pain. On examination, there was noted to be a bony mass to the left mandible with a lesion/wound to the left side of the tongue.  Subsequently, the patient saw Dr. Elijah Birk, who performed a direct laryngoscopy with biopsy and cervical esophagoscopy on 09/05/2020. Pathology from the procedure revealed well-differentiated invasive squamous cell carcinoma without perineural or lymphovascular invasion  Pertinent imaging thus far includes: 1. Soft tissue neck CT scan on 09/05/2020 that showed a large mass occupying much of the left tongue extending to the tongue base and containing central necrosis. Findings were compatible with carcinoma of the tongue. Additionally, there was enlargement of the sublingual gland on the left, which may have been due to tumor invasion. There were no pathologic lymph nodes in the neck. 2. CT scan of check on 09/05/2020 did not show any evidence of metastatic disease to the chest. 3. MRI of the neck on 10/12/2020 that showed a large mass within the tongue that was apparently restricted to the left side without evidence of crossing of the tongue  midline. The lesion in total measured about 7 cm from front to back and extended all the way from the superior surface of the anterior tongue back to the posterior tongue and base of the left. The tumor bulged into the sublingual region on the left. Additionally, there was edematous change of the adjacent soft tissues, questionably secondary to regional radiation. Finally, there was mild edema and enhancement of level 2 nodes on the left, likely related to radiation. There was no evidence of cervical lymph node enlargement or necrosis.  The patient underwent a tracheostomy, mandibulotomy, partial left pharyngectomy, total glossectomy, bilateral mRND and ALT free flap on 10/15/2020 under the care of Dr. Lowell Guitar at [facility].  Pathology from [facility] revealed: Invasive squamous cell carcinoma, moderately differentiated, tumor size 5.4 cm.  Depth of invasion 2.6 cm.  Perineural invasion present.  No lymphovascular invasion.  Tumor involved the left floor of mouth, left pharyngeal tissue, tonsillar tissue, and salivary gland tissue.  Margins are negative.  Closest margin is 0.3 cm from the posterior soft tissue margin.  54 lymph nodes were removed from the bilateral neck, levels 1B through 2A/2B, and all were negative.  HPV negative  Swallowing issues, if any:  Yes -he is reliant on his PEG tube  Weight Changes:  Wt Readings from Last 3 Encounters:  11/23/20 127 lb (57.6 kg)  11/23/20 128 lb 9.6 oz (58.3 kg)  11/02/20 127 lb 3.2 oz (57.7 kg)   Tobacco history, if any: He quit smoking in October 2021.  He smoked 0.7 packs a day for 22 years  ETOH abuse, if any: Denies  any current alcohol use.  He has not yet been vaccinated against Covid.  He is here with his aunt today who appears very supportive.  PREVIOUS RADIATION THERAPY: No  PAST MEDICAL HISTORY:  has a past medical history of Cancer ([place]).    PAST SURGICAL HISTORY: Past Surgical History:  Procedure Laterality Date  . DIRECT LARYNGOSCOPY N/A  09/05/2020   Procedure: DIRECT LARYNGOSCOPY;  Surgeon: Marcina Millard, MD;  Location: [place];  Service: ENT;  Laterality: N/A;  . ESOPHAGOSCOPY N/A 09/05/2020   Procedure: ESOPHAGOSCOPY;  Surgeon: Marcina Millard, MD;  Location: [place];  Service: ENT;  Laterality: N/A;  . FRACTURE SURGERY    . TONGUE BIOPSY N/A 09/05/2020   Procedure: TONGUE BIOPSY;  Surgeon: Marcina Millard, MD;  Location: [facility] OR;  Service: ENT;  Laterality: N/A;    FAMILY HISTORY: family history includes Lung cancer in his maternal grandmother, mother, and sister; Stroke in his father.  SOCIAL HISTORY:  reports that he has quit smoking. His smoking use included cigarettes. He smoked 0.50 packs per day. He has never used smokeless tobacco. He reports previous alcohol use. He reports current drug use. Drug: Marijuana.  ALLERGIES: Patient has no known allergies.  MEDICATIONS:  Current Outpatient Medications  Medication Sig Dispense Refill  . chlorthalidone (HYGROTON) 25 MG tablet Take 1 tablet (25 mg total) by mouth daily. (Patient not taking: Reported on 11/23/2020) 90 tablet 3  . ibuprofen (ADVIL) 800 MG tablet Take 1 tablet (800 mg total) by mouth every 8 (eight) hours as needed. 30 tablet 0  . Nutritional Supplements (FEEDING SUPPLEMENT, OSMOLITE 1.5 CAL,) LIQD 1-1/2 cartons of Osmolite 1.5, 4 times daily through [facility] low-profile gastrostomy tube with 60 mL free water flush before and after each bolus feeding.  In addition flush 240 mL free water 3 times daily between feedings.  Provides 2130 cal, 89.4 g protein, 2286 mL free water/100% needs.  Please contact patient's aunt, Irwin Brakeman at [phone] for delivery information.  Send formula and supplies. (Patient taking differently: 1-1/2 cartons of Osmolite 1.5, 4 times daily through [facility] low-profile gastrostomy tube with 60 mL free water flush before and after each bolus feeding.  In addition flush 240 mL free water 3 times daily between feedings.  Provides 2130 cal,  89.4 g protein, 2286 mL free water/100% needs.  Please contact patient's aunt, Irwin Brakeman at [phone] for delivery information.  Send formula and supplies.) 1422 mL 0  . oxyCODONE (OXY IR/ROXICODONE) 5 MG immediate release tablet Take 5 mg by mouth 2 (two) times daily as needed. (Patient not taking: Reported on 11/23/2020)     No current facility-administered medications for this encounter.    REVIEW OF SYSTEMS:  Notable for that above.   PHYSICAL EXAM:  height is 5\' 9"  (1.753 m) and weight is 127 lb (57.6 kg). His temperature is 97.8 F (36.6 C). His blood pressure is 94/74 and his pulse is 117 (abnormal). His respiration is 20 and oxygen saturation is 100%.   General: Alert and oriented, in no acute distress HEENT: Head is normocephalic. Extraocular movements are intact. Oropharynx is notable for reconstructed oral tongue.   Healing satisfactorily from surgery.  No thrush.  No evidence of visible tumor in the mouth or oropharynx.   Neck: Neck is notable for bilateral cervical scars with satisfactory healing.  No palpable adenopathy Abdomen: PEG site is clean without any use Extremities: No cyanosis or edema. Lymphatics: see Neck Exam Skin: No concerning lesions. Musculoskeletal: symmetric strength and muscle  tone throughout. Neurologic: Cranial nerves II through XII are grossly intact. No obvious focalities. Speech is fluent. Coordination is intact. Psychiatric: Judgment and insight are intact. Affect is appropriate.   ECOG = 1  0 - Asymptomatic (Fully active, able to carry on all predisease activities without restriction)  1 - Symptomatic but completely ambulatory (Restricted in physically strenuous activity but ambulatory and able to carry out work of a light or sedentary nature. For example, light housework, office work)  2 - Symptomatic, <50% in bed during the day (Ambulatory and capable of all self care but unable to carry out any work activities. Up and about more than 50% of  waking hours)  3 - Symptomatic, >50% in bed, but not bedbound (Capable of only limited self-care, confined to bed or chair 50% or more of waking hours)  4 - Bedbound (Completely disabled. Cannot carry on any self-care. Totally confined to bed or chair)  5 - Death   Eustace Pen MM, Creech RH, Tormey DC, et al. [phone]). "Toxicity and response criteria of the [facility]". [place] Oncol. 5 (6): 649-55   LABORATORY DATA:  Lab Results  Component Value Date   WBC 5.1 11/23/2020   HGB 9.8 (L) 11/23/2020   HCT 30.2 (L) 11/23/2020   MCV 82.7 11/23/2020   PLT 386 11/23/2020   CMP     Component Value Date/Time   NA 134 (L) 11/23/2020 1335   K 5.2 (H) 11/23/2020 1335   CL 99 11/23/2020 1335   CO2 26 11/23/2020 1335   GLUCOSE 86 11/23/2020 1335   BUN 17 11/23/2020 1335   CREATININE 0.73 11/23/2020 1335   CALCIUM 9.8 11/23/2020 1335   PROT 8.6 (H) 11/09/2018 1126   ALBUMIN 4.2 11/09/2018 1126   AST 57 (H) 11/09/2018 1126   ALT 34 11/09/2018 1126   ALKPHOS 73 11/09/2018 1126   BILITOT 1.9 (H) 11/09/2018 1126   GFRNONAA >60 11/23/2020 1335   GFRAA >60 11/09/2018 1126      No results found for: TSH   RADIOGRAPHY:  As above  IMPRESSION/PLAN:  This is a delightful patient with head and neck cancer. I recommend 6 weeks of adjuvant radiotherapy for this patient to his tongue/tumor bed and bilateral neck.  He was discussed at our multidisciplinary tumor board and have personally reviewed his imaging and pathology.  We discussed the potential risks, benefits, and side effects of radiotherapy. We talked in detail about acute and late effects. We discussed that some of the most bothersome acute effects may be mucositis, dysgeusia, salivary changes, skin irritation, hair loss, dehydration, weight loss and fatigue. We talked about late effects which include but are not necessarily limited to dysphagia, hypothyroidism, nerve injury, vascular injury, spinal cord injury,  xerostomia, trismus, neck edema, and potential injury to any of the tissues in the head and neck region. No guarantees of treatment were given. A consent form was signed and placed in the patient's medical record. The patient is enthusiastic about proceeding with treatment. I look forward to participating in the patient's care.    Simulation (treatment planning) will take place in the next available appointment that works for the patient's schedule -Anderson Malta, our navigator, will arrange  We also discussed that the treatment of head and neck cancer is a multidisciplinary process to maximize treatment outcomes and quality of life. For this reason the following referrals have been or will be made:   Medical oncology to discuss chemotherapy (based on tumor board discussion, chemotherapy is  not indicated)   Dentistry for dental evaluation, recognizing that he is already undergone extractions at [facility].  His teeth are in need of cleanings and he will benefit from counsel to maximize oral hygiene in the future   Nutritionist for nutrition support during and after treatment.   Speech language pathology for swallowing and/or speech therapy.   Social work for social support.    Physical therapy due to risk of lymphedema in neck and deconditioning.   Baseline labs including TSH.  We discussed measures to reduce the risk of infection during the COVID-19 pandemic.  I highly recommend that he get vaccinated.  After a long discussion which I answered his questions to the best of my ability, he is agreeable to this.  We will arrange his first vaccination today.  On date of service, in total, I spent 65 minutes on this encounter. Patient was seen in person.  __________________________________________   Eppie Gibson, MD  This document serves as a record of services personally performed by Eppie Gibson, MD. It was created on his behalf by Clerance Lav, a trained medical scribe. The creation of this  record is based on the scribe's personal observations and the provider's statements to them. This document has been checked and approved by the attending provider.

## 2020-11-23 ENCOUNTER — Encounter: Payer: Self-pay | Admitting: Hematology and Oncology

## 2020-11-23 ENCOUNTER — Telehealth: Payer: Self-pay | Admitting: Hematology and Oncology

## 2020-11-23 ENCOUNTER — Other Ambulatory Visit: Payer: Self-pay

## 2020-11-23 ENCOUNTER — Ambulatory Visit
Admission: RE | Admit: 2020-11-23 | Discharge: 2020-11-23 | Disposition: A | Discharge status: Home / Self Care | Payer: Medicaid Other | Source: Ambulatory Visit | Attending: Radiation Oncology | Admitting: Radiation Oncology

## 2020-11-23 ENCOUNTER — Inpatient Hospital Stay: Payer: Medicaid Other

## 2020-11-23 ENCOUNTER — Inpatient Hospital Stay: Payer: Medicaid Other | Attending: Hematology and Oncology | Admitting: Nutrition

## 2020-11-23 ENCOUNTER — Inpatient Hospital Stay (HOSPITAL_BASED_OUTPATIENT_CLINIC_OR_DEPARTMENT_OTHER): Payer: Medicaid Other | Admitting: Hematology and Oncology

## 2020-11-23 ENCOUNTER — Telehealth: Payer: Self-pay | Admitting: *Deleted

## 2020-11-23 VITALS — BP 94/74 | HR 117 | Temp 97.8°F | Resp 20 | Ht 69.0 in | Wt 127.0 lb

## 2020-11-23 VITALS — BP 111/72 | HR 105 | Temp 97.0°F | Resp 16 | Ht 69.0 in | Wt 128.6 lb

## 2020-11-23 DIAGNOSIS — Z823 Family history of stroke: Secondary | ICD-10-CM | POA: Diagnosis not present

## 2020-11-23 DIAGNOSIS — C029 Malignant neoplasm of tongue, unspecified: Secondary | ICD-10-CM

## 2020-11-23 DIAGNOSIS — R4789 Other speech disturbances: Secondary | ICD-10-CM | POA: Diagnosis not present

## 2020-11-23 DIAGNOSIS — Z87891 Personal history of nicotine dependence: Secondary | ICD-10-CM | POA: Insufficient documentation

## 2020-11-23 DIAGNOSIS — C01 Malignant neoplasm of base of tongue: Secondary | ICD-10-CM

## 2020-11-23 DIAGNOSIS — R634 Abnormal weight loss: Secondary | ICD-10-CM

## 2020-11-23 DIAGNOSIS — C021 Malignant neoplasm of border of tongue: Secondary | ICD-10-CM | POA: Insufficient documentation

## 2020-11-23 DIAGNOSIS — Z79899 Other long term (current) drug therapy: Secondary | ICD-10-CM | POA: Diagnosis not present

## 2020-11-23 DIAGNOSIS — Z23 Encounter for immunization: Secondary | ICD-10-CM

## 2020-11-23 DIAGNOSIS — Z801 Family history of malignant neoplasm of trachea, bronchus and lung: Secondary | ICD-10-CM | POA: Diagnosis not present

## 2020-11-23 DIAGNOSIS — Z1329 Encounter for screening for other suspected endocrine disorder: Secondary | ICD-10-CM

## 2020-11-23 DIAGNOSIS — C023 Malignant neoplasm of anterior two-thirds of tongue, part unspecified: Secondary | ICD-10-CM | POA: Insufficient documentation

## 2020-11-23 LAB — CBC WITH DIFFERENTIAL/PLATELET
Abs Immature Granulocytes: 0.01 10*3/uL (ref 0.00–0.07)
Basophils Absolute: 0 10*3/uL (ref 0.0–0.1)
Basophils Relative: 0 %
Eosinophils Absolute: 0 10*3/uL (ref 0.0–0.5)
Eosinophils Relative: 1 %
HCT: 30.2 % — ABNORMAL LOW (ref 39.0–52.0)
Hemoglobin: 9.8 g/dL — ABNORMAL LOW (ref 13.0–17.0)
Immature Granulocytes: 0 %
Lymphocytes Relative: 40 %
Lymphs Abs: 2 10*3/uL (ref 0.7–4.0)
MCH: 26.8 pg (ref 26.0–34.0)
MCHC: 32.5 g/dL (ref 30.0–36.0)
MCV: 82.7 fL (ref 80.0–100.0)
Monocytes Absolute: 0.7 10*3/uL (ref 0.1–1.0)
Monocytes Relative: 14 %
Neutro Abs: 2.3 10*3/uL (ref 1.7–7.7)
Neutrophils Relative %: 45 %
Platelets: 386 10*3/uL (ref 150–400)
RBC: 3.65 MIL/uL — ABNORMAL LOW (ref 4.22–5.81)
RDW: 13.5 % (ref 11.5–15.5)
WBC: 5.1 10*3/uL (ref 4.0–10.5)
nRBC: 0 % (ref 0.0–0.2)

## 2020-11-23 LAB — BASIC METABOLIC PANEL - CANCER CENTER ONLY
Anion gap: 9 (ref 5–15)
BUN: 17 mg/dL (ref 6–20)
CO2: 26 mmol/L (ref 22–32)
Calcium: 9.8 mg/dL (ref 8.9–10.3)
Chloride: 99 mmol/L (ref 98–111)
Creatinine: 0.73 mg/dL (ref 0.61–1.24)
GFR, Estimated: >60 mL/min (ref >60)
Glucose, Bld: 86 mg/dL (ref 70–99)
Potassium: 5.2 mmol/L — ABNORMAL HIGH (ref 3.5–5.1)
Sodium: 134 mmol/L — ABNORMAL LOW (ref 135–145)

## 2020-11-23 LAB — MAGNESIUM: Magnesium: 2.1 mg/dL (ref 1.7–2.4)

## 2020-11-23 MED ORDER — OSMOLITE 1.5 CAL PO LIQD: ORAL | 0 refills | Status: DC

## 2020-11-23 NOTE — Telephone Encounter (Signed)
CALLED PATIENT TO ASK ABOUT COMING FOR LAB ON Friday JAN. 28, PATIENT AGREED TO COME IN AT 11 AM

## 2020-11-23 NOTE — Progress Notes (Signed)
45 year old male diagnosed with tongue cancer. He presents today with his aunt, Irwin Brakeman.  He is seeing Dr. Isidore Moos and Dr. Chryl Heck.  Past medical history includes polysubstance abuse.  Medications include Pepcid, Prilosec, MiraLAX, and Senokot.  Labs were reviewed.  Height: 69 inches. Weight: 127 pounds. Usual body weight: 170 pounds in January 2020. BMI: 18.75.  Estimated nutrition needs: 1920-2120 cal, 75-100 g protein, 2 L fluid.  Patient reports he has tolerated 5 cartons of Osmolite 1.5 daily.  He recently switched over to original Ensure because he ran out of Osmolite 1.5. Apparently, there were no tube feeding orders written to home health agency. Patient's tube was placed at [facility] and he has a [employer] low profile Gastrotomy feeding tube. He is Medicaid pending so tube feeding should be covered.  He denies nutrition impact symptoms from tube feeding.  Patient demonstrated appropriate tube feeding administration.  He is pleased to be able to infuse feedings using gravity method and syringes.  Nutrition diagnosis: Unintended weight loss related to tongue cancer as evidenced by 25% weight loss from usual body weight over 1 year.  Intervention: Patient educated to change Osmolite 1.5-1-1/2 cartons 4 times daily with 60 mL free water before and after bolus feedings. In addition flush feeding tube with an additional 240 mL free water 3 times daily between feedings. Patient instructed and demonstrated appropriate tube feeding administration. Patient given samples of Osmolite 1.5 with additional supplies. Tube feeding orders were written and home care agency notified. Patient was given written instructions. Questions were answered. Teach back method used. Contact information provided.  [employer] to contact patient's aunt, Irwin Brakeman at [phone] to communicate TF and supply delivery.  Monitoring, evaluation, goals: Patient will tolerate tube feeding at goal rate to minimize  weight loss.  Next visit: To be scheduled with upcoming treatment weekly.  **Disclaimer: This note was dictated with voice recognition software. Similar sounding words can inadvertently be transcribed and this note may contain transcription errors which may not have been corrected upon publication of note.**

## 2020-11-23 NOTE — Telephone Encounter (Signed)
No follow-up appointments scheduled per 1/25 los. Sent patient back to the labs today.

## 2020-11-23 NOTE — Progress Notes (Signed)
   Covid-19 Vaccination Clinic  Name:  [name]    MRN: [id number] DOB: [birth year]  11/23/2020  [name] was observed post Covid-19 immunization for 15 minutes without incident. He was provided with Vaccine Information Sheet and instruction to access the V-Safe system.   [name] was instructed to call 911 with any severe reactions post vaccine: Marland Kitchen Difficulty breathing  . Swelling of face and throat  . A fast heartbeat  . A bad rash all over body  . Dizziness and weakness

## 2020-11-23 NOTE — Progress Notes (Signed)
[facility] CONSULT NOTE  Patient Care Team: Pcp, No as PCP - General  CHIEF COMPLAINTS/PURPOSE OF CONSULTATION:  Squamous cell carcinoma of oral cavity  ASSESSMENT & PLAN:  No problem-specific Assessment & Plan notes found for this encounter.  No orders of the defined types were placed in this encounter.  [name] is a 45 y.o. 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 Now s/p total glossectomy via mandibular split approach, partial pharyngectomy, bilateral neck dissection, ALT free flap reconstruction on 10/15/20. Final pathology showed 5.4 cms tumor with DOI of 2.6 cms. No evidence of LVI or perineural invasion, margins free of tumor, closest approach is 0.3 cms from posterior soft tissue margin. 54 LN negative. He saw Dr Isidore Moos today for consideration of adjuvant radiation. At this time given no evidence of lymph node involvement or positive margins, I do not feel that there is a strong indication for chemotherapy.  I mentioned to the patient that there may not be a significant added benefit from chemotherapy.  If he were to recur, then he will have to be referred to medical oncology for consideration of chemotherapy at that time.  He expressed understanding of the recommendations. With regards to concern for weight loss, nutrition, have discussed with Dr. Isidore Moos and she will monitor his labs and follow-up with him. He will continue surveillance with ENT and radiation oncology upon completion of radiation.  HISTORY OF PRESENTING ILLNESS:  [name] 45 y.o. male is here because of new diagnosis of left tongue cancer.  He arrived with his family member today to the appointment.  He tells me that he first noticed some pain in the left tongue but attributed this to back to scraping his tongue.  He went in to see his dentist and was found to have this  left tongue mass which was biopsied back in November and demonstrated invasive squamous cell carcinoma.  He then underwent MRI imaging and surgery at [facility], had total glossectomy, partial pharyngectomy, bilateral neck dissection and is following up with medical oncology and radiation oncology for adjuvant recommendations.  Biopsies revealed:  09/05/2020 FINAL MICROSCOPIC DIAGNOSIS:  A. TONGUE, LEFT BASE, BIOPSY:  - Invasive squamous cell carcinoma.  COMMENT:  There is no perineural or lymphovascular invasion. The carcinoma appears well differentiated ADDENDUM:  P16 is only focally and weakly positive in the invasive tumor (<5%).  10/12/2020 IMPRESSION: Large mass within the tongue apparently restricted to the left side, without evidence of crossing of the tongue midline. Lesion in total measures about 7 cm from front to back. Lesion extends all the way from the superior surface of the anterior tongue back to the posterior tongue and base of the left. Tumor bulges into the sublingual region on the left. This appears very similar to the previous CT, as best as the 2 techniques can be compared. Invasion of the left submandibular gland is not demonstrated. There is edematous change of the adjacent soft tissues, questionably secondary to regional radiation. Mild edema and enhancement of level 2 nodes on the left likely related to radiation. No evidence of  cervical lymph node enlargement or necrosis.  12/17. Had total glossectomy via mandibular split approach, partial pharyngectomy, bilateral neck dissection, ALT free flap reconstruction on 10/15/20. Final pathology showed 5.4 cms tumor with DOI of 2.6 cms. No evidence of LVI or perineural invasion, margins free of tumor,  closest approach is 0.3 cms from posterior soft tissue margin. 54 LN negative. He denies any pain at this time, has some difficulty with speech and ongoing weight loss.  He is establishing with nutritionist to assist in  weight management.  He is not on any medications.  He feels well overall.  Rest of the review of systems as mentioned below and unremarkable.  REVIEW OF SYSTEMS:   Constitutional: Denies fevers, chills or abnormal night sweats Eyes: Denies blurriness of vision, double vision or watery eyes Ears, nose, mouth, throat, and face: Denies mucositis or sore throat Respiratory: Denies cough, dyspnea or wheezes Cardiovascular: Denies palpitation, chest discomfort or lower extremity swelling Gastrointestinal:  Denies nausea, heartburn or change in bowel habits Skin: Denies abnormal skin rashes Lymphatics: Denies new lymphadenopathy or easy bruising Neurological:Denies numbness, tingling or new weaknesses Behavioral/Psych: Mood is stable, no new changes  All other systems were reviewed with the patient and are negative.  MEDICAL HISTORY:  Past Medical History:  Diagnosis Date  . Cancer ([place])    tongue    SURGICAL HISTORY: Past Surgical History:  Procedure Laterality Date  . DIRECT LARYNGOSCOPY N/A 09/05/2020   Procedure: DIRECT LARYNGOSCOPY;  Surgeon: Marcina Millard, MD;  Location: [facility];  Service: ENT;  Laterality: N/A;  . ESOPHAGOSCOPY N/A 09/05/2020   Procedure: ESOPHAGOSCOPY;  Surgeon: Marcina Millard, MD;  Location: [facility];  Service: ENT;  Laterality: N/A;  . FRACTURE SURGERY    . TONGUE BIOPSY N/A 09/05/2020   Procedure: TONGUE BIOPSY;  Surgeon: Marcina Millard, MD;  [facility];  Service: ENT;  Laterality: N/A;    SOCIAL HISTORY: Social History   Socioeconomic History  . Marital status: Single    Spouse name: Not on file  . Number of children: Not on file  . Years of education: Not on file  . Highest education level: Not on file  Occupational History  . Not on file  Tobacco Use  . Smoking status: Former Smoker    Packs/day: 1PPD for 25 yrs    Types: Cigarettes  . Smokeless tobacco: Never Used  Vaping Use  . Vaping Use: Never used  Substance and Sexual  Activity  . Alcohol use: Not Currently  . Drug use: Yes    Types: Marijuana  . Sexual activity: Not Currently  Other Topics Concern  . Not on file  Social History Narrative  . Not on file   Social Determinants of Health   Financial Resource Strain: Not on file  Food Insecurity: Not on file  Transportation Needs: Not on file  Physical Activity: Not on file  Stress: Not on file  Social Connections: Not on file  Intimate Partner Violence: Not on file    FAMILY HISTORY: Family History  Problem Relation Age of Onset  . Lung cancer Mother   . Stroke Father   . Lung cancer Sister   . Lung cancer Maternal Grandmother     ALLERGIES:  has No Known Allergies.  MEDICATIONS:  Current Outpatient Medications  Medication Sig Dispense Refill  . ibuprofen (ADVIL) 800 MG tablet Take 1 tablet (800 mg total) by mouth every 8 (eight) hours as needed. 30 tablet 0  . Nutritional Supplements (FEEDING SUPPLEMENT, OSMOLITE 1.5 CAL,) LIQD 1-1/2 cartons of Osmolite 1.5, 4 times daily through [facility] low-profile gastrostomy tube with 60 mL free water flush before and after each bolus feeding.  In addition flush 240 mL free water 3 times daily between feedings.  Provides 2130 cal, 89.4  g protein, 2286 mL free water/100% needs.  Please contact patient's aunt, Irwin Brakeman at [phone] for delivery information.  Send formula and supplies. (Patient taking differently: 1-1/2 cartons of Osmolite 1.5, 4 times daily through [facility] low-profile gastrostomy tube with 60 mL free water flush before and after each bolus feeding.  In addition flush 240 mL free water 3 times daily between feedings.  Provides 2130 cal, 89.4 g protein, 2286 mL free water/100% needs.  Please contact patient's aunt, Irwin Brakeman at [phone] for delivery information.  Send formula and supplies.) 1422 mL 0  . chlorthalidone (HYGROTON) 25 MG tablet Take 1 tablet (25 mg total) by mouth daily. (Patient not taking: Reported on 11/23/2020) 90 tablet 3  .  oxyCODONE (OXY IR/ROXICODONE) 5 MG immediate release tablet Take 5 mg by mouth 2 (two) times daily as needed. (Patient not taking: Reported on 11/23/2020)    . polyethylene glycol powder (GLYCOLAX/MIRALAX) 17 GM/SCOOP powder 17 g by Per G Tube route daily for 30 days. (Patient not taking: Reported on 11/23/2020)     No current facility-administered medications for this visit.     PHYSICAL EXAMINATION: ECOG PERFORMANCE STATUS: 0 - Asymptomatic  Vitals:   11/23/20 1230  BP: 111/72  Pulse: (!) 105  Resp: 16  Temp: (!) 97 F (36.1 C)  SpO2: 100%   Filed Weights   11/23/20 1230  Weight: 128 lb 9.6 oz (58.3 kg)    GENERAL:alert, no distress and comfortable SKIN: skin color, texture, turgor are normal, no rashes or significant lesions EYES: normal, conjunctiva are pink and non-injected, sclera clear OROPHARYNX: Graft noted, healing well. NECK: Postop surgical scar bilateral neck noted LUNGS: clear to auscultation and percussion with normal breathing effort HEART: regular rate & rhythm and no murmurs and no lower extremity edema ABDOMEN:abdomen soft, non-tender and normal bowel sounds Musculoskeletal:no cyanosis of digits and no clubbing  PSYCH: alert & oriented x 3 with fluent speech NEURO: no focal motor/sensory deficits  LABORATORY DATA:  I have reviewed the data as listed Lab Results  Component Value Date   WBC 8.0 09/05/2020   HGB 13.4 09/05/2020   HCT 41.3 09/05/2020   MCV 91.8 09/05/2020   PLT 241 09/05/2020     Chemistry      Component Value Date/Time   NA 134 (L) 09/05/2020 1416   K 3.4 (L) 09/05/2020 1416   CL 98 09/05/2020 1416   CO2 26 09/05/2020 1416   BUN 7 09/05/2020 1416   CREATININE 0.77 09/05/2020 1416      Component Value Date/Time   CALCIUM 9.6 09/05/2020 1416   ALKPHOS 73 11/09/2018 1126   AST 57 (H) 11/09/2018 1126   ALT 34 11/09/2018 1126   BILITOT 1.9 (H) 11/09/2018 1126     I have reviewed all pertinent records including imaging and  pathology reports.  RADIOGRAPHIC STUDIES: I have personally reviewed the radiological images as listed and agreed with the findings in the report. No results found.  All questions were answered. The patient knows to call the clinic with any problems, questions or concerns. I spent 45 minutes in the care of this patient including H and P, review of records, counseling and coordination of care.     Benay Pike, MD 11/23/2020 1:10 PM

## 2020-11-23 NOTE — Progress Notes (Signed)
Oncology Nurse Navigator Documentation  Met with patient during initial consult with Dr. Isidore Moos. He was accompanied by his [employer]. . Further introduced myself as his/their Navigator, explained my role as a member of the Care Team. . Provided New Patient Information packet: o Contact information for physician, this navigator, other members of the Care Team o Advance Directive information ([place] blue pamphlet with LCSW insert); provided [facility] AD booklet at his request,  o Fall Prevention Patient [place] sheet o Symptom Management Clinic information o [facility] campus map with highlight of [place] o SLP Information sheet . Provided and discussed educational handout for PAC. Marland Kitchen Assisted with post-consult appt scheduling. . They verbalized understanding of information provided. . I encouraged them to call with questions/concerns moving forward.  Harlow Asa, RN, BSN, OCN Head & Neck Oncology Nurse [place] at [place] [phone]

## 2020-11-24 ENCOUNTER — Inpatient Hospital Stay: Payer: Self-pay | Admitting: Hematology and Oncology

## 2020-11-24 ENCOUNTER — Encounter: Payer: Self-pay | Admitting: Hematology and Oncology

## 2020-11-24 ENCOUNTER — Encounter: Payer: Self-pay | Admitting: Radiation Oncology

## 2020-11-24 ENCOUNTER — Encounter: Payer: Self-pay | Admitting: General Practice

## 2020-11-24 NOTE — Progress Notes (Signed)
[facility] Work  Initial Assessment   [name] is a 45 y.o. year old male contacted by phone. Clinical Social Work was referred by medical oncologist for assessment of psychosocial needs. Due to physical issues, patient is difficult to understand over the phone, CSW will try to further assess when in person in [place].    SDOH (Social Determinants of Health) assessments performed: Yes   Distress Screen completed: Yes ONCBCN DISTRESS SCREENING 11/23/2020  Screening Type Initial Screening  Distress experienced in past week (1-10) 1  Physician notified of physical symptoms -  Referral to clinical psychology -  Referral to clinical social work -  Referral to dietition -  Referral to financial advocate -  Referral to support programs -  Referral to palliative care -      Family/Social Information:  . Housing Arrangement: patient lives with aunt now, usually lives by himself. . Family members/support persons in your life? Aunt is housing him, and other family members are helping . Transportation concerns: No, uses [employer] system  . Employment: Unemployed , has applied for Medicaid and disability . Marland Kitchen Income source: none at this time . 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 Food access concerns: has food and supplies he needs right now . Religious or spiritual practice: did not assess . Medication Concerns:Has applied for [employer] and is aware he can use [employer] as needed.  . Services Currently in place:  none  Coping/ Adjustment to diagnosis: . Patient understands treatment plan and what happens next? Diagnosed with left tongue cancer, invasive squamous cell carcinoma.  Had glossectomy in December 2021.  Has seen medical and radiation oncologists, will be treated w chemoradiation.  Will see speech therapist on 1/31.   Marland Kitchen Concerns about diagnosis  and/or treatment: Body image (disfigurement), How I will pay for the services I need and how to recover from treatment . Patient reported stressors: Finances, Adjusting to my illness and Physical issues . Hopes and priorities: unable to assess . Patient enjoys fishing, being outside and time with family/ friends . Current coping skills/ strengths: Supportive family/friends    SUMMARY: Current SDOH Barriers:  . difficulty w communication due to glossectomy   Interventions: . Discussed common feeling and emotions when being diagnosed with cancer, and the importance of support during treatment . Informed patient of the support team roles and support services at [facility] . Provided CSW contact information and encouraged patient to call with any questions or concerns . Provided patient with information about [place] and resources available   Follow Up Plan: CSW will see patient on [place] at scheduled appointment, patient knows to contact SW team w needs prior to that time Patient verbalizes understanding of plan: Yes    [name] , [place], [place] Worker Phone:  [phone]

## 2020-11-26 ENCOUNTER — Ambulatory Visit
Admission: RE | Admit: 2020-11-26 | Discharge: 2020-11-26 | Disposition: A | Discharge status: Home / Self Care | Payer: Medicaid Other | Source: Ambulatory Visit | Attending: Radiation Oncology | Admitting: Radiation Oncology

## 2020-11-26 ENCOUNTER — Other Ambulatory Visit: Payer: Self-pay

## 2020-11-26 DIAGNOSIS — C029 Malignant neoplasm of tongue, unspecified: Secondary | ICD-10-CM | POA: Diagnosis present

## 2020-11-26 DIAGNOSIS — Z1329 Encounter for screening for other suspected endocrine disorder: Secondary | ICD-10-CM | POA: Insufficient documentation

## 2020-11-26 LAB — CBC WITH DIFFERENTIAL/PLATELET
Abs Immature Granulocytes: 0.02 10*3/uL (ref 0.00–0.07)
Basophils Absolute: 0 10*3/uL (ref 0.0–0.1)
Basophils Relative: 0 %
Eosinophils Absolute: 0 10*3/uL (ref 0.0–0.5)
Eosinophils Relative: 0 %
HCT: 29.6 % — ABNORMAL LOW (ref 39.0–52.0)
Hemoglobin: 9.5 g/dL — ABNORMAL LOW (ref 13.0–17.0)
Immature Granulocytes: 0 %
Lymphocytes Relative: 22 %
Lymphs Abs: 1.7 10*3/uL (ref 0.7–4.0)
MCH: 26.8 pg (ref 26.0–34.0)
MCHC: 32.1 g/dL (ref 30.0–36.0)
MCV: 83.6 fL (ref 80.0–100.0)
Monocytes Absolute: 0.4 10*3/uL (ref 0.1–1.0)
Monocytes Relative: 6 %
Neutro Abs: 5.3 10*3/uL (ref 1.7–7.7)
Neutrophils Relative %: 72 %
Platelets: 355 10*3/uL (ref 150–400)
RBC: 3.54 MIL/uL — ABNORMAL LOW (ref 4.22–5.81)
RDW: 13.7 % (ref 11.5–15.5)
WBC: 7.4 10*3/uL (ref 4.0–10.5)
nRBC: 0 % (ref 0.0–0.2)

## 2020-11-26 LAB — PHOSPHORUS: Phosphorus: 3.8 mg/dL (ref 2.5–4.6)

## 2020-11-26 LAB — TSH: TSH: 0.903 u[IU]/mL (ref 0.320–4.118)

## 2020-11-26 LAB — BASIC METABOLIC PANEL - CANCER CENTER ONLY
Anion gap: 7 (ref 5–15)
BUN: 10 mg/dL (ref 6–20)
CO2: 31 mmol/L (ref 22–32)
Calcium: 9.8 mg/dL (ref 8.9–10.3)
Chloride: 99 mmol/L (ref 98–111)
Creatinine: 0.69 mg/dL (ref 0.61–1.24)
GFR, Estimated: >60 mL/min (ref >60)
Glucose, Bld: 121 mg/dL — ABNORMAL HIGH (ref 70–99)
Potassium: 4.5 mmol/L (ref 3.5–5.1)
Sodium: 137 mmol/L (ref 135–145)

## 2020-11-26 LAB — MAGNESIUM: Magnesium: 1.8 mg/dL (ref 1.7–2.4)

## 2020-11-29 ENCOUNTER — Other Ambulatory Visit: Payer: Self-pay

## 2020-11-29 ENCOUNTER — Ambulatory Visit
Admission: RE | Admit: 2020-11-29 | Discharge: 2020-11-29 | Disposition: A | Discharge status: Home / Self Care | Payer: Medicaid Other | Source: Ambulatory Visit | Attending: Radiation Oncology | Admitting: Radiation Oncology

## 2020-11-29 DIAGNOSIS — Z1329 Encounter for screening for other suspected endocrine disorder: Secondary | ICD-10-CM

## 2020-11-29 LAB — BASIC METABOLIC PANEL - CANCER CENTER ONLY
Anion gap: 8 (ref 5–15)
BUN: 14 mg/dL (ref 6–20)
CO2: 29 mmol/L (ref 22–32)
Calcium: 10 mg/dL (ref 8.9–10.3)
Chloride: 100 mmol/L (ref 98–111)
Creatinine: 0.66 mg/dL (ref 0.61–1.24)
GFR, Estimated: >60 mL/min (ref >60)
Glucose, Bld: 75 mg/dL (ref 70–99)
Potassium: 4.3 mmol/L (ref 3.5–5.1)
Sodium: 137 mmol/L (ref 135–145)

## 2020-11-29 LAB — MAGNESIUM: Magnesium: 2 mg/dL (ref 1.7–2.4)

## 2020-11-29 LAB — PHOSPHORUS: Phosphorus: 4.1 mg/dL (ref 2.5–4.6)

## 2020-11-30 ENCOUNTER — Ambulatory Visit
Admission: RE | Admit: 2020-11-30 | Discharge: 2020-11-30 | Disposition: A | Discharge status: Home / Self Care | Payer: Medicaid Other | Source: Ambulatory Visit | Attending: Radiation Oncology | Admitting: Radiation Oncology

## 2020-11-30 ENCOUNTER — Ambulatory Visit (HOSPITAL_COMMUNITY): Payer: Self-pay | Admitting: Dentistry

## 2020-11-30 ENCOUNTER — Other Ambulatory Visit: Payer: Self-pay

## 2020-11-30 VITALS — BP 99/71 | HR 89 | Temp 98.2°F

## 2020-11-30 DIAGNOSIS — Z510 Encounter for antineoplastic radiation therapy: Secondary | ICD-10-CM | POA: Diagnosis not present

## 2020-11-30 DIAGNOSIS — K08199 Complete loss of teeth due to other specified cause, unspecified class: Secondary | ICD-10-CM

## 2020-11-30 DIAGNOSIS — C029 Malignant neoplasm of tongue, unspecified: Secondary | ICD-10-CM

## 2020-11-30 DIAGNOSIS — R252 Cramp and spasm: Secondary | ICD-10-CM

## 2020-11-30 DIAGNOSIS — C021 Malignant neoplasm of border of tongue: Secondary | ICD-10-CM | POA: Diagnosis present

## 2020-11-30 DIAGNOSIS — Z01818 Encounter for other preprocedural examination: Secondary | ICD-10-CM

## 2020-11-30 DIAGNOSIS — Z1329 Encounter for screening for other suspected endocrine disorder: Secondary | ICD-10-CM | POA: Insufficient documentation

## 2020-11-30 DIAGNOSIS — K036 Deposits [accretions] on teeth: Secondary | ICD-10-CM

## 2020-11-30 DIAGNOSIS — K06020 Generalized gingival recession, unspecified: Secondary | ICD-10-CM

## 2020-11-30 NOTE — Progress Notes (Signed)
Oncology Nurse Navigator Documentation  To provide support, encouragement and care continuity, met with Mr. Spratlin during his CT SIM.  He tolerated procedure without difficulty, denied questions/concerns.   I accompanied him to his appointment with Dr. Benson Norway in dental medicine.  I encouraged him to call me prior to 12/09/20 [place].  He knows to call me if he has any questions or concerns.   Harlow Asa RN, BSN, OCN Head & Neck Oncology Nurse [place] at [facility] Phone # [phone]  Fax # [phone]

## 2020-11-30 NOTE — Progress Notes (Signed)
DENTAL VISIT OUTPATIENT CONSULTATION  Service Date:   11/30/2020 Referring Provider:                  Eppie Gibson, MD  Patient Name:   [name] Date of Birth:   12/27/1975 Medical Record Number: [id number]    PLAN & RECOMMENDATIONS   >> Recommend the patient return to the [facility] after completion of radiation therapy for a follow-up exam and fabrication of fluoride trays.    >> Recommend the patient establish care at a dental office of his choice for routine dental care including replacement of missing teeth, cleaning and periodic exams. >> Discussed in detail all treatment options with the patient and he is agreeable to the plan.   Thank you for consulting with Hospital Dentistry and for the opportunity to participate in this patient's treatment.  Should you have any questions or concerns, please contact the [facility] at [phone].    Consult Note:  11/30/2020    COVID 19 SCREENING: The patient denies symptoms concerning for COVID-19 infection including fever, chills, cough, or newly developed shortness of breath.   HPI: [name] is a very pleasant 45 y.o. 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  Patient presents today for a dental evaluation as part of their Pre-Radiation work-up.  He was seen at [facility] in 09/2020 where he had radiographs taken multiple extractions completed by their hospital dentist in preparation for his cancer treatment.  Dr. Isidore Moos referred him here for discussion and education on the side effects of radiation and what to expect, as well as to stress the importance of maintaining optimal oral hygiene during and following radiation.  He had his simulation this morning prior to his dental visit. Of note, he also underwent a tracheostomy, mandibulotomy, partial left pharyngectomy, total glossectomy, bilateral mRND and ALT free flap on 10/15/2020 under the care of Dr. Florene Glen at [facility].  He is  reliant on his PEG tube due to issues with swallowing. Dental History: Patient reports that it has been several years since he has last seen a dentist for comprehensive care.  He currently denies any problems after his multiple extractions last month as well as any current dental/oral pain or sensitivity.  Patient able to manage oral secretions.  Patient denies any new SOB and neck pain.  Patient denies fever, rigors and malaise.  He does have difficulty swallowing due to cancer.   CHIEF COMPLAINT: "I was told to come to this appointment by my doctor."   Patient Active Problem List   Diagnosis Date Noted  . Cancer of anterior two-thirds of tongue ([place]) 11/23/2020  . HTN (hypertension) 10/15/2020  . Influenza vaccine refused 09/21/2020  . Hypertension 09/21/2020  . Daily consumption of alcohol 09/13/2020  . Squamous cell cancer of tongue ([place]) 09/13/2020  . Tongue carcinoma ([place])    Past Medical History:  Diagnosis Date  . Cancer ([place])    tongue   Past Surgical History:  Procedure Laterality Date  . DIRECT LARYNGOSCOPY N/A 09/05/2020   Procedure: DIRECT LARYNGOSCOPY;  Surgeon: Marcina Millard, MD;  Location: [place];  Service: ENT;  Laterality: N/A;  . ESOPHAGOSCOPY N/A 09/05/2020   Procedure: ESOPHAGOSCOPY;  Surgeon: Marcina Millard, MD;  Location: [place];  Service: ENT;  Laterality: N/A;  . FRACTURE SURGERY    . TONGUE BIOPSY N/A 09/05/2020   Procedure: TONGUE BIOPSY;  Surgeon: Marcina Millard, MD;  Location: [place];  Service: ENT;  Laterality: N/A;   No Known Allergies Current Outpatient Medications  Medication Sig Dispense Refill  . chlorthalidone (HYGROTON) 25 MG tablet Take 1 tablet (25 mg total) by mouth daily. (Patient not taking: Reported on 11/23/2020) 90 tablet 3  . ibuprofen (ADVIL) 800 MG tablet Take 1 tablet (800 mg total) by mouth every 8 (eight) hours as needed. 30 tablet 0  . Nutritional Supplements (FEEDING SUPPLEMENT, OSMOLITE 1.5 CAL,) LIQD 1-1/2 cartons  of Osmolite 1.5, 4 times daily through [facility] low-profile gastrostomy tube with 60 mL free water flush before and after each bolus feeding.  In addition flush 240 mL free water 3 times daily between feedings.  Provides 2130 cal, 89.4 g protein, 2286 mL free water/100% needs.  Please contact patient's aunt, Irwin Brakeman at [phone] for delivery information.  Send formula and supplies. (Patient taking differently: 1-1/2 cartons of Osmolite 1.5, 4 times daily through [facility] low-profile gastrostomy tube with 60 mL free water flush before and after each bolus feeding.  In addition flush 240 mL free water 3 times daily between feedings.  Provides 2130 cal, 89.4 g protein, 2286 mL free water/100% needs.  Please contact patient's aunt, Irwin Brakeman at [phone] for delivery information.  Send formula and supplies.) 1422 mL 0  . oxyCODONE (OXY IR/ROXICODONE) 5 MG immediate release tablet Take 5 mg by mouth 2 (two) times daily as needed. (Patient not taking: Reported on 11/23/2020)     No current facility-administered medications for this visit.    LABS: Lab Results  Component Value Date   WBC 7.4 11/26/2020   HGB 9.5 (L) 11/26/2020   HCT 29.6 (L) 11/26/2020   MCV 83.6 11/26/2020   PLT 355 11/26/2020      Component Value Date/Time   NA 137 11/29/2020 1212   K 4.3 11/29/2020 1212   CL 100 11/29/2020 1212   CO2 29 11/29/2020 1212   GLUCOSE 75 11/29/2020 1212   BUN 14 11/29/2020 1212   CREATININE 0.66 11/29/2020 1212   CALCIUM 10.0 11/29/2020 1212   GFRNONAA >60 11/29/2020 1212   GFRAA >60 11/09/2018 1126   No results found for: INR, PROTIME No results found for: PTT  Social History   Socioeconomic History  . Marital status: Single    Spouse name: Not on file  . Number of children: Not on file  . Years of education: Not on file  . Highest education level: Not on file  Occupational History  . Not on file  Tobacco Use  . Smoking status: Former Smoker    Packs/day: 0.50    Types: Cigarettes   . Smokeless tobacco: Never Used  Vaping Use  . Vaping Use: Never used  Substance and Sexual Activity  . Alcohol use: Not Currently  . Drug use: Yes    Types: Marijuana  . Sexual activity: Not Currently  Other Topics Concern  . Not on file  Social History Narrative  . Not on file   Social Determinants of Health   Financial Resource Strain: Not on file  Food Insecurity: Not on file  Transportation Needs: No Transportation Needs  . Lack of Transportation (Medical): No  . Lack of Transportation (Non-Medical): No  Physical Activity: Not on file  Stress: Not on file  Social Connections: Not on file  Intimate Partner Violence: Not on file   Family History  Problem Relation Age of Onset  . Lung cancer Mother   . Stroke Father   . Lung cancer Sister   . Lung cancer Maternal  Grandmother     REVIEW OF SYSTEMS: Reviewed with the patient as per HPI. PSYCH: Patient denies having dental phobia.   VITAL SIGNS: BP 99/71 (BP Location: Left Arm)   Pulse 89   Temp 98.2 F (36.8 C)    PHYSICAL EXAMINATION: GENERAL: Well-developed, comfortable and in no apparent distress.   NEUROLOGICAL: Alert and oriented to person, place and time. EXTRAORAL:  Facial symmetry present without any edema or erythema. TMJ asymptomatic without clicks or crepitations. (+) Limited opening without pain.   Maximum interinsical opening = 25 mm INTRAORAL: Soft tissues appear well-perfused and mucous membranes moist.  No signs of infection, parulis, sinus tract, edema or erythema evident upon exam. Flap from surgical resection on the left side of tongue.  DENTAL EXAMINATION: DENTITION: Overall fair remaining dentition.  Missing teeth, recent extractions sites in posterior regions in all 4 quadrants appear to be healing well without any signs of wound dehiscence or infection and consistent with dental procedures performed. The patient is maintaining poor oral hygiene. PERIODONTAL: Slightly inflamed,  erythematous gingival tissue.  Moderate, generalized plaque accumulation.  Gingival recession. DENTAL CARIES/DEFECTIVE RESTORATIONS: None clinically noted; patient has plaque accumulation and needs cleaning/FMD to completely exam all surfaces of teeth. PROSTHODONTIC:  Patient denies wearing partial dentures. OCCLUSION: Missing all posterior teeth clinically.  RADIOGRAPHIC EXAMINATION No radiographs were taken at today's visit.   ASSESSMENT 1. SCC of the tongue 2. Pre-Radiation Dental Exam 3. Hypertension 4. Missing teeth 5. Accretions on teeth 6. Gingival recession 7. Trismus   PLAN/RECOMMENDATIONS  . I discussed the risks, benefits, and complications of various treatment options with the patient in relationship to his medical and dental conditions.  I discussed the side effects of radiation on the oral cavity which include limited opening, loss of taste, dry mouth, radiation caries and ORN.  I explained the importance of maintaining optimal oral hygiene throughout radiation and afterwards, and the importance of finding an outside dental provider to see every 4-6 mos following treatment to monitor for new cavities and for updated Xrays.   . We discussed various treatment options to include no treatment, multiple extractions with alveoloplasty, pre-prosthetic surgery as indicated, periodontal therapy, dental restorations, root canal therapy, crown and bridge therapy, implant therapy, and replacement of missing teeth as indicated once he finishes with radiation.  I explained that after radiation, we are able to make him upper and lower fluoride trays for his remaining teeth that he can use every night (likely for the rest of his life). . The patient verbalized understanding of all options, and currently wishes to proceed with follow-up exam in our clinic after radiation and finding an outside dental provider for routine dental care.   Trismus appliance was made for the patient today and  demonstrated on use by Leta Speller, DAII.  Verbal and written instructions given to the patient.  . Discussion of findings with medical team and coordination of future medical and dental care as needed.   The patient tolerated today's visit well.  All questions and concerns were addressed and the patient departed in stable condition.   I spent in excess of 60 minutes during the conduct of this consultation and >50% of this time involved direct face-to-face encounter for counseling and/or coordination of the patient's care.   [place], DMD

## 2020-11-30 NOTE — Patient Instructions (Signed)
[facility] Department of Dental Medicine Dr. Jermani Eberlein B. Cinnamon Morency Phone: [phone] Fax: [phone]   It was a pleasure seeing you today!  Please refer to the information below regarding your dental visit with us, and call us should you have any questions or concerns that may come up after you leave.   Thank you for giving us the opportunity to provide care for you.  If there is anything we can do for you, please let us know.     RADIATION THERAPY AND INFORMATION REGARDING YOUR TEETH   [x]Xerostomia (Dry Mouth) Your salivary glands may be in the field of radiation.  Radiation may include all or only part of your salivary glands.  This will cause your saliva to dry up, and you will have a dry mouth.  The dry mouth will be for the rest of your life unless your radiation oncologist tells you otherwise.  Your saliva has many functions:  It wets your tongue for speaking.  It coats your teeth and the inside of your mouth for easier movement.  It helps with chewing and swallowing food.  It helps clean away harmful acid and toxic products made by the germs in your mouth, therefore it helps prevent cavities.  It kills some germs in your mouth and helps to prevent gum disease.  It helps to carry flavor to your taste buds.  Once you have lost your saliva you will be at higher risk for tooth decay and gum disease.    What can be done to help improve your mouth when there's not enough saliva: . Your dentist may give a prescription for Salagen.  It will not bring back all of your saliva but may bring back some of it.  Also, your saliva may be thick and ropy or white and foamy. It will not feel like it use to feel. . You will need to swish with water every time your mouth feels dry.  YOU CANNOT suck on any cough drops, mints, lemon drops, candy, vitamin C or any other products.  You cannot use anything other than water to make your mouth feel less dry.  If you want to drink anything else,  you have to drink it all at once and brush afterwards.  Be sure to discuss the details of your diet habits with your dentist or hygienist.  [x]Radiation caries: . This is decay (cavities) that happens very quickly once your mouth is very dry due to radiation therapy.  Normally, cavities take six months to two years to become a problem.  When you have dry mouth, cavities may take as little as eight weeks to cause you a problem.   . Dental check-ups every two months are necessary as long as you have a dry mouth. Radiation caries typically, but not always, start at your gum line where it is hard to see the cavity.  It is therefore also hard to fill these cavities adequately.  This high rate of cavities happens because your mouth no longer has saliva and therefore the acid made by the germs starts the decay process.  Whenever you eat anything the germs in your mouth change the food into acid.  The acid then burns a small hole in your tooth.  This small hole is the beginning of a cavity.  If this is not treated then it will grow bigger and become a cavity.  The way to avoid this hole getting bigger is to use fluoride every evening as prescribed by your   dentist following your radiation.   NOTE:  You have to make sure that your teeth are very clean before you use the fluoride.  This fluoride in turn will strengthen your teeth and prepare them for another day of fighting acid.  If you develop radiation caries many times, the damage is so large that you will have to have all your teeth removed.  This could be a big problem if some of these teeth are in the field of radiation.  Further details of why this could be a big problem will follow.  (See Osteoradionecrosis).  [x]Dysgeusia (Loss of Taste) This happens to varying degrees once you've had radiation therapy to your jaw region.  Many times taste is not completely lost, but becomes limited.  The loss of taste is mostly due to radiation affecting your taste buds.   However, if you have no saliva in your mouth to carry the flavor to your taste buds, it would be difficult for your taste buds to taste anything.  That is why using water or a prescription for Salagen prior to meals and during meal times may help with some of the taste.  Keep in mind that taste generally returns very slowly over the course of several months or several years after radiation therapy.  Don't give up hope.  [x]Trismus (Limited Jaw Opening) According to your Radiation Oncologist, your TMJ or jaw joints are going to be partially or fully in the field of radiation.  This means that over time the muscles that help you open and close your mouth may get stiff.  This will potentially result in your not being able to open your mouth wide enough or as wide as you can open it now.    Let me give you an example of how slowly this happens and how unaware people are of it:   A gentlemen that had radiation therapy two years ago came back to me complaining that bananas are just too large for him to be able to fit them in between his teeth.  He was not able to open wide enough to bite into a banana.  This happens slowly and over a period of time.  What we do to try and prevent this:   . Your dentist will probably give you a stack of sticks called a trismus exercise device.  This stack will help remind your muscles and your jaw joints to open up to the same distance every day.  Use these sticks every morning when you wake up, or according to the instructions given by your dentist.    . You must use these sticks for at least one to two years after radiation therapy.  The reason for that is because it happens so slowly and keeps going on for about two years after radiation therapy.  Your hospital dentist will help you monitor your mouth opening and make sure that it's not getting smaller after radiation.  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 This is a condition where your jaw bone after radiation therapy becomes  very dry.  It has very little blood supply to keep it alive.  If you develop a cavity that turns into an abscess or an infection, then the jaw bone does not have enough blood supply to help fight the infection.  At this point it is very likely that the infection could cause the death of your jaw bone.  When you have dead bone it has to be removed.  Therefore, you might end up having   to have surgery to remove part of your jaw bone, the part of the jaw bone that has been affected.    . Healing is also a problem if you are to have surgery (like a tooth extraction) in the areas where the bone has had radiation therapy.  If you have surgery, you need more blood supply to heal which is not available.  When blood supply and oxygen are not available, there is a chance for the bone to die. . Occasionally, ORN happens on its own with no obvious reason, but this is quite rare.  We believe that patients who continue to smoke and/or drink alcohol have a higher chance of having this problem. . Once your jaw bone has had radiation therapy, if there are any remaining teeth in that area, it is not recommended to have them pulled unless your dentist or oral surgeon is aware of your history of radiation and believes it is safe.  . The risks for ORN either from infection or spontaneously occurring (with no reason) are life long. 

## 2020-12-02 ENCOUNTER — Other Ambulatory Visit: Payer: Self-pay

## 2020-12-02 ENCOUNTER — Ambulatory Visit
Admission: RE | Admit: 2020-12-02 | Discharge: 2020-12-02 | Disposition: A | Discharge status: Home / Self Care | Payer: Medicaid Other | Source: Ambulatory Visit | Attending: Radiation Oncology | Admitting: Radiation Oncology

## 2020-12-02 DIAGNOSIS — C029 Malignant neoplasm of tongue, unspecified: Secondary | ICD-10-CM

## 2020-12-02 DIAGNOSIS — Z510 Encounter for antineoplastic radiation therapy: Secondary | ICD-10-CM | POA: Diagnosis not present

## 2020-12-02 DIAGNOSIS — Z1329 Encounter for screening for other suspected endocrine disorder: Secondary | ICD-10-CM

## 2020-12-02 LAB — CBC WITH DIFFERENTIAL/PLATELET
Abs Immature Granulocytes: 0.02 10*3/uL (ref 0.00–0.07)
Basophils Absolute: 0 10*3/uL (ref 0.0–0.1)
Basophils Relative: 0 %
Eosinophils Absolute: 0 10*3/uL (ref 0.0–0.5)
Eosinophils Relative: 0 %
HCT: 28.6 % — ABNORMAL LOW (ref 39.0–52.0)
Hemoglobin: 9.2 g/dL — ABNORMAL LOW (ref 13.0–17.0)
Immature Granulocytes: 0 %
Lymphocytes Relative: 27 %
Lymphs Abs: 1.7 10*3/uL (ref 0.7–4.0)
MCH: 26.2 pg (ref 26.0–34.0)
MCHC: 32.2 g/dL (ref 30.0–36.0)
MCV: 81.5 fL (ref 80.0–100.0)
Monocytes Absolute: 0.5 10*3/uL (ref 0.1–1.0)
Monocytes Relative: 8 %
Neutro Abs: 4.1 10*3/uL (ref 1.7–7.7)
Neutrophils Relative %: 65 %
Platelets: 465 10*3/uL — ABNORMAL HIGH (ref 150–400)
RBC: 3.51 MIL/uL — ABNORMAL LOW (ref 4.22–5.81)
RDW: 14.1 % (ref 11.5–15.5)
WBC: 6.3 10*3/uL (ref 4.0–10.5)
nRBC: 0 % (ref 0.0–0.2)

## 2020-12-02 LAB — BASIC METABOLIC PANEL - CANCER CENTER ONLY
Anion gap: 8 (ref 5–15)
BUN: 13 mg/dL (ref 6–20)
CO2: 30 mmol/L (ref 22–32)
Calcium: 9.8 mg/dL (ref 8.9–10.3)
Chloride: 101 mmol/L (ref 98–111)
Creatinine: 0.67 mg/dL (ref 0.61–1.24)
GFR, Estimated: >60 mL/min (ref >60)
Glucose, Bld: 90 mg/dL (ref 70–99)
Potassium: 4.2 mmol/L (ref 3.5–5.1)
Sodium: 139 mmol/L (ref 135–145)

## 2020-12-02 LAB — PHOSPHORUS: Phosphorus: 4.3 mg/dL (ref 2.5–4.6)

## 2020-12-02 LAB — MAGNESIUM: Magnesium: 1.8 mg/dL (ref 1.7–2.4)

## 2020-12-06 ENCOUNTER — Other Ambulatory Visit: Payer: Self-pay

## 2020-12-06 ENCOUNTER — Ambulatory Visit
Admission: RE | Admit: 2020-12-06 | Discharge: 2020-12-06 | Disposition: A | Discharge status: Home / Self Care | Payer: Medicaid Other | Source: Ambulatory Visit | Attending: Radiation Oncology | Admitting: Radiation Oncology

## 2020-12-06 DIAGNOSIS — C029 Malignant neoplasm of tongue, unspecified: Secondary | ICD-10-CM

## 2020-12-06 DIAGNOSIS — Z510 Encounter for antineoplastic radiation therapy: Secondary | ICD-10-CM | POA: Diagnosis not present

## 2020-12-06 DIAGNOSIS — Z1329 Encounter for screening for other suspected endocrine disorder: Secondary | ICD-10-CM

## 2020-12-06 LAB — BASIC METABOLIC PANEL - CANCER CENTER ONLY
Anion gap: 8 (ref 5–15)
BUN: 14 mg/dL (ref 6–20)
CO2: 28 mmol/L (ref 22–32)
Calcium: 9.9 mg/dL (ref 8.9–10.3)
Chloride: 102 mmol/L (ref 98–111)
Creatinine: 0.66 mg/dL (ref 0.61–1.24)
GFR, Estimated: >60 mL/min (ref >60)
Glucose, Bld: 101 mg/dL — ABNORMAL HIGH (ref 70–99)
Potassium: 4 mmol/L (ref 3.5–5.1)
Sodium: 138 mmol/L (ref 135–145)

## 2020-12-06 LAB — CBC WITH DIFFERENTIAL/PLATELET
Abs Immature Granulocytes: 0.01 10*3/uL (ref 0.00–0.07)
Basophils Absolute: 0 10*3/uL (ref 0.0–0.1)
Basophils Relative: 0 %
Eosinophils Absolute: 0 10*3/uL (ref 0.0–0.5)
Eosinophils Relative: 0 %
HCT: 29.1 % — ABNORMAL LOW (ref 39.0–52.0)
Hemoglobin: 9.1 g/dL — ABNORMAL LOW (ref 13.0–17.0)
Immature Granulocytes: 0 %
Lymphocytes Relative: 16 %
Lymphs Abs: 1.4 10*3/uL (ref 0.7–4.0)
MCH: 25.9 pg — ABNORMAL LOW (ref 26.0–34.0)
MCHC: 31.3 g/dL (ref 30.0–36.0)
MCV: 82.7 fL (ref 80.0–100.0)
Monocytes Absolute: 0.9 10*3/uL (ref 0.1–1.0)
Monocytes Relative: 11 %
Neutro Abs: 6.4 10*3/uL (ref 1.7–7.7)
Neutrophils Relative %: 73 %
Platelets: 401 10*3/uL — ABNORMAL HIGH (ref 150–400)
RBC: 3.52 MIL/uL — ABNORMAL LOW (ref 4.22–5.81)
RDW: 14.1 % (ref 11.5–15.5)
WBC: 8.8 10*3/uL (ref 4.0–10.5)
nRBC: 0 % (ref 0.0–0.2)

## 2020-12-06 LAB — MAGNESIUM: Magnesium: 2 mg/dL (ref 1.7–2.4)

## 2020-12-06 LAB — PHOSPHORUS: Phosphorus: 3.4 mg/dL (ref 2.5–4.6)

## 2020-12-08 DIAGNOSIS — Z510 Encounter for antineoplastic radiation therapy: Secondary | ICD-10-CM | POA: Diagnosis not present

## 2020-12-09 ENCOUNTER — Ambulatory Visit
Admission: RE | Admit: 2020-12-09 | Discharge: 2020-12-09 | Disposition: A | Discharge status: Home / Self Care | Payer: Medicaid Other | Source: Ambulatory Visit | Attending: Radiation Oncology | Admitting: Radiation Oncology

## 2020-12-09 ENCOUNTER — Ambulatory Visit: Payer: Medicaid Other

## 2020-12-09 ENCOUNTER — Other Ambulatory Visit: Payer: Self-pay

## 2020-12-09 DIAGNOSIS — Z510 Encounter for antineoplastic radiation therapy: Secondary | ICD-10-CM | POA: Diagnosis not present

## 2020-12-09 DIAGNOSIS — Z1329 Encounter for screening for other suspected endocrine disorder: Secondary | ICD-10-CM

## 2020-12-09 DIAGNOSIS — C029 Malignant neoplasm of tongue, unspecified: Secondary | ICD-10-CM

## 2020-12-09 LAB — PHOSPHORUS: Phosphorus: 2.9 mg/dL (ref 2.5–4.6)

## 2020-12-09 LAB — CBC WITH DIFFERENTIAL/PLATELET
Abs Immature Granulocytes: 0.04 10*3/uL (ref 0.00–0.07)
Basophils Absolute: 0 10*3/uL (ref 0.0–0.1)
Basophils Relative: 0 %
Eosinophils Absolute: 0 10*3/uL (ref 0.0–0.5)
Eosinophils Relative: 0 %
HCT: 27.6 % — ABNORMAL LOW (ref 39.0–52.0)
Hemoglobin: 8.7 g/dL — ABNORMAL LOW (ref 13.0–17.0)
Immature Granulocytes: 0 %
Lymphocytes Relative: 12 %
Lymphs Abs: 1.2 10*3/uL (ref 0.7–4.0)
MCH: 25.4 pg — ABNORMAL LOW (ref 26.0–34.0)
MCHC: 31.5 g/dL (ref 30.0–36.0)
MCV: 80.7 fL (ref 80.0–100.0)
Monocytes Absolute: 1.1 10*3/uL — ABNORMAL HIGH (ref 0.1–1.0)
Monocytes Relative: 11 %
Neutro Abs: 7.9 10*3/uL — ABNORMAL HIGH (ref 1.7–7.7)
Neutrophils Relative %: 77 %
Platelets: 430 10*3/uL — ABNORMAL HIGH (ref 150–400)
RBC: 3.42 MIL/uL — ABNORMAL LOW (ref 4.22–5.81)
RDW: 14.3 % (ref 11.5–15.5)
WBC: 10.3 10*3/uL (ref 4.0–10.5)
nRBC: 0 % (ref 0.0–0.2)

## 2020-12-09 LAB — BASIC METABOLIC PANEL - CANCER CENTER ONLY
Anion gap: 9 (ref 5–15)
BUN: 15 mg/dL (ref 6–20)
CO2: 27 mmol/L (ref 22–32)
Calcium: 9.7 mg/dL (ref 8.9–10.3)
Chloride: 98 mmol/L (ref 98–111)
Creatinine: 0.71 mg/dL (ref 0.61–1.24)
GFR, Estimated: >60 mL/min (ref >60)
Glucose, Bld: 120 mg/dL — ABNORMAL HIGH (ref 70–99)
Potassium: 3.9 mmol/L (ref 3.5–5.1)
Sodium: 134 mmol/L — ABNORMAL LOW (ref 135–145)

## 2020-12-09 LAB — MAGNESIUM: Magnesium: 2 mg/dL (ref 1.7–2.4)

## 2020-12-10 ENCOUNTER — Ambulatory Visit
Admission: RE | Admit: 2020-12-10 | Discharge: 2020-12-10 | Disposition: A | Discharge status: Home / Self Care | Payer: Medicaid Other | Source: Ambulatory Visit | Attending: Radiation Oncology | Admitting: Radiation Oncology

## 2020-12-10 ENCOUNTER — Other Ambulatory Visit: Payer: Self-pay | Admitting: Hematology and Oncology

## 2020-12-10 ENCOUNTER — Other Ambulatory Visit: Payer: Self-pay | Admitting: Radiation Oncology

## 2020-12-10 ENCOUNTER — Other Ambulatory Visit: Payer: Self-pay

## 2020-12-10 DIAGNOSIS — Z510 Encounter for antineoplastic radiation therapy: Secondary | ICD-10-CM | POA: Diagnosis not present

## 2020-12-10 DIAGNOSIS — D649 Anemia, unspecified: Secondary | ICD-10-CM

## 2020-12-10 DIAGNOSIS — C029 Malignant neoplasm of tongue, unspecified: Secondary | ICD-10-CM

## 2020-12-10 NOTE — Progress Notes (Signed)
iro

## 2020-12-13 ENCOUNTER — Ambulatory Visit
Admission: RE | Admit: 2020-12-13 | Discharge: 2020-12-13 | Disposition: A | Discharge status: Home / Self Care | Payer: Medicaid Other | Source: Ambulatory Visit | Attending: Radiation Oncology | Admitting: Radiation Oncology

## 2020-12-13 ENCOUNTER — Other Ambulatory Visit: Payer: Self-pay | Admitting: Radiation Oncology

## 2020-12-13 ENCOUNTER — Ambulatory Visit: Payer: Medicaid Other

## 2020-12-13 ENCOUNTER — Other Ambulatory Visit: Payer: Self-pay

## 2020-12-13 DIAGNOSIS — C029 Malignant neoplasm of tongue, unspecified: Secondary | ICD-10-CM

## 2020-12-13 DIAGNOSIS — D649 Anemia, unspecified: Secondary | ICD-10-CM

## 2020-12-13 DIAGNOSIS — Z510 Encounter for antineoplastic radiation therapy: Secondary | ICD-10-CM | POA: Diagnosis not present

## 2020-12-13 DIAGNOSIS — Z1329 Encounter for screening for other suspected endocrine disorder: Secondary | ICD-10-CM

## 2020-12-13 DIAGNOSIS — C023 Malignant neoplasm of anterior two-thirds of tongue, part unspecified: Secondary | ICD-10-CM

## 2020-12-13 LAB — CBC WITH DIFFERENTIAL/PLATELET
Abs Immature Granulocytes: 0.02 10*3/uL (ref 0.00–0.07)
Basophils Absolute: 0 10*3/uL (ref 0.0–0.1)
Basophils Relative: 0 %
Eosinophils Absolute: 0 10*3/uL (ref 0.0–0.5)
Eosinophils Relative: 0 %
HCT: 26.9 % — ABNORMAL LOW (ref 39.0–52.0)
Hemoglobin: 8.4 g/dL — ABNORMAL LOW (ref 13.0–17.0)
Immature Granulocytes: 0 %
Lymphocytes Relative: 20 %
Lymphs Abs: 1.3 10*3/uL (ref 0.7–4.0)
MCH: 24.9 pg — ABNORMAL LOW (ref 26.0–34.0)
MCHC: 31.2 g/dL (ref 30.0–36.0)
MCV: 79.8 fL — ABNORMAL LOW (ref 80.0–100.0)
Monocytes Absolute: 0.7 10*3/uL (ref 0.1–1.0)
Monocytes Relative: 11 %
Neutro Abs: 4.6 10*3/uL (ref 1.7–7.7)
Neutrophils Relative %: 69 %
Platelets: 401 10*3/uL — ABNORMAL HIGH (ref 150–400)
RBC: 3.37 MIL/uL — ABNORMAL LOW (ref 4.22–5.81)
RDW: 14.6 % (ref 11.5–15.5)
WBC: 6.7 10*3/uL (ref 4.0–10.5)
nRBC: 0 % (ref 0.0–0.2)

## 2020-12-13 LAB — BASIC METABOLIC PANEL - CANCER CENTER ONLY
Anion gap: 10 (ref 5–15)
BUN: 12 mg/dL (ref 6–20)
CO2: 27 mmol/L (ref 22–32)
Calcium: 10.3 mg/dL (ref 8.9–10.3)
Chloride: 99 mmol/L (ref 98–111)
Creatinine: 0.66 mg/dL (ref 0.61–1.24)
GFR, Estimated: >60 mL/min (ref >60)
Glucose, Bld: 98 mg/dL (ref 70–99)
Potassium: 3.9 mmol/L (ref 3.5–5.1)
Sodium: 136 mmol/L (ref 135–145)

## 2020-12-13 LAB — IRON AND TIBC
Iron: 11 ug/dL — ABNORMAL LOW (ref 42–163)
Saturation Ratios: 3 % — ABNORMAL LOW (ref 20–55)
TIBC: 328 ug/dL (ref 202–409)
UIBC: 317 ug/dL (ref 117–376)

## 2020-12-13 LAB — MAGNESIUM: Magnesium: 2.1 mg/dL (ref 1.7–2.4)

## 2020-12-13 LAB — VITAMIN B12: Vitamin B-12: 217 pg/mL (ref 180–914)

## 2020-12-13 LAB — FERRITIN: Ferritin: 119 ng/mL (ref 24–336)

## 2020-12-13 LAB — RETICULOCYTES
Immature Retic Fract: 19 % — ABNORMAL HIGH (ref 2.3–15.9)
RBC.: 3.39 MIL/uL — ABNORMAL LOW (ref 4.22–5.81)
Retic Count, Absolute: 23.1 10*3/uL (ref 19.0–186.0)
Retic Ct Pct: 0.7 % (ref 0.4–3.1)

## 2020-12-13 LAB — LACTATE DEHYDROGENASE: LDH: 129 U/L (ref 98–192)

## 2020-12-13 LAB — PHOSPHORUS: Phosphorus: 4.5 mg/dL (ref 2.5–4.6)

## 2020-12-13 MED ORDER — LIDOCAINE VISCOUS HCL 2 % MT SOLN: OROMUCOSAL | 3 refills | Status: DC

## 2020-12-13 MED ORDER — SONAFINE EX EMUL
1.0000 "application " | Freq: Two times a day (BID) | CUTANEOUS | Status: DC
  Administered 2020-12-13: 1 via TOPICAL

## 2020-12-13 NOTE — Progress Notes (Signed)

## 2020-12-14 ENCOUNTER — Ambulatory Visit
Admission: RE | Admit: 2020-12-14 | Discharge: 2020-12-14 | Disposition: A | Discharge status: Home / Self Care | Payer: Medicaid Other | Source: Ambulatory Visit | Attending: Radiation Oncology | Admitting: Radiation Oncology

## 2020-12-14 ENCOUNTER — Ambulatory Visit (INDEPENDENT_AMBULATORY_CARE_PROVIDER_SITE_OTHER): Payer: Medicaid Other | Admitting: Family

## 2020-12-14 ENCOUNTER — Inpatient Hospital Stay: Payer: Medicaid Other | Attending: Hematology and Oncology

## 2020-12-14 ENCOUNTER — Encounter: Payer: Self-pay | Admitting: Family

## 2020-12-14 VITALS — BP 97/64 | HR 110 | Wt 131.6 lb

## 2020-12-14 DIAGNOSIS — Z0000 Encounter for general adult medical examination without abnormal findings: Secondary | ICD-10-CM

## 2020-12-14 DIAGNOSIS — Z823 Family history of stroke: Secondary | ICD-10-CM | POA: Insufficient documentation

## 2020-12-14 DIAGNOSIS — Z1329 Encounter for screening for other suspected endocrine disorder: Secondary | ICD-10-CM

## 2020-12-14 DIAGNOSIS — D649 Anemia, unspecified: Secondary | ICD-10-CM | POA: Insufficient documentation

## 2020-12-14 DIAGNOSIS — Z1159 Encounter for screening for other viral diseases: Secondary | ICD-10-CM

## 2020-12-14 DIAGNOSIS — Z131 Encounter for screening for diabetes mellitus: Secondary | ICD-10-CM

## 2020-12-14 DIAGNOSIS — Z13220 Encounter for screening for lipoid disorders: Secondary | ICD-10-CM

## 2020-12-14 DIAGNOSIS — Z510 Encounter for antineoplastic radiation therapy: Secondary | ICD-10-CM | POA: Diagnosis not present

## 2020-12-14 DIAGNOSIS — Z13228 Encounter for screening for other metabolic disorders: Secondary | ICD-10-CM

## 2020-12-14 DIAGNOSIS — Z23 Encounter for immunization: Secondary | ICD-10-CM

## 2020-12-14 DIAGNOSIS — Z79899 Other long term (current) drug therapy: Secondary | ICD-10-CM | POA: Insufficient documentation

## 2020-12-14 DIAGNOSIS — Z1211 Encounter for screening for malignant neoplasm of colon: Secondary | ICD-10-CM

## 2020-12-14 DIAGNOSIS — Z130 Encounter for screening for diseases of the blood and blood-forming organs and certain disorders involving the immune mechanism: Secondary | ICD-10-CM

## 2020-12-14 DIAGNOSIS — Z87891 Personal history of nicotine dependence: Secondary | ICD-10-CM | POA: Insufficient documentation

## 2020-12-14 DIAGNOSIS — Z801 Family history of malignant neoplasm of trachea, bronchus and lung: Secondary | ICD-10-CM | POA: Insufficient documentation

## 2020-12-14 DIAGNOSIS — C01 Malignant neoplasm of base of tongue: Secondary | ICD-10-CM | POA: Insufficient documentation

## 2020-12-14 LAB — FOLATE RBC
Folate, Hemolysate: 284 ng/mL
Folate, RBC: 1040 ng/mL (ref >498)
Hematocrit: 27.3 % — ABNORMAL LOW (ref 37.5–51.0)

## 2020-12-14 NOTE — Progress Notes (Signed)
   Covid-19 Vaccination Clinic  Name:  [name]    MRN: [id number] DOB: [birth year]  12/14/2020  [name] was observed post Covid-19 immunization for 15 minutes without incident. He was provided with Vaccine Information Sheet and instruction to access the V-Safe system.   [name] was instructed to call 911 with any severe reactions post vaccine: Marland Kitchen Difficulty breathing  . Swelling of face and throat  . A fast heartbeat  . A bad rash all over body  . Dizziness and weakness   Immunizations Administered    Name Date Dose VIS Date Route   PFIZER Comrnaty(Gray TOP) Covid-19 Vaccine 12/14/2020 12:38 PM 0.3 mL 10/07/2020 Intramuscular   Manufacturer: [place]   Lot: [id number]   NDC: [phone]

## 2020-12-14 NOTE — Progress Notes (Signed)
Annual physical

## 2020-12-14 NOTE — Patient Instructions (Signed)
Annual physical exam and labs today. Will call with results.   Follow-up with primary provider as needed.   Preventive Care 46-46 Years Old, Male Preventive care refers to lifestyle choices and visits with your health care provider that can promote health and wellness. This includes:  A yearly physical exam. This is also called an annual wellness visit.  Regular dental and eye exams.  Immunizations.  Screening for certain conditions.  Healthy lifestyle choices, such as: ? Eating a healthy diet. ? Getting regular exercise. ? Not using drugs or products that contain nicotine and tobacco. ? Limiting alcohol use. What can I expect for my preventive care visit? Physical exam Your health care provider will check your:  Height and weight. These may be used to calculate your BMI (body mass index). BMI is a measurement that tells if you are at a healthy weight.  Heart rate and blood pressure.  Body temperature.  Skin for abnormal spots. Counseling Your health care provider may ask you questions about your:  Past medical problems.  Family's medical history.  Alcohol, tobacco, and drug use.  Emotional well-being.  Home life and relationship well-being.  Sexual activity.  Diet, exercise, and sleep habits.  Work and work Statistician.  Access to firearms. What immunizations do I need? Vaccines are usually given at various ages, according to a schedule. Your health care provider will recommend vaccines for you based on your age, medical history, and lifestyle or other factors, such as travel or where you work.   What tests do I need? Blood tests  Lipid and cholesterol levels. These may be checked every 5 years, or more often if you are over 46 years old.  Hepatitis C test.  Hepatitis B test. Screening  Lung cancer screening. You may have this screening every year starting at age 46 if you have a 30-pack-year history of smoking and currently smoke or have quit within  the past 15 years.  Prostate cancer screening. Recommendations will vary depending on your family history and other risks.  Genital exam to check for testicular cancer or hernias.  Colorectal cancer screening. ? All adults should have this screening starting at age 40 and continuing until age 41. ? Your health care provider may recommend screening at age 46 if you are at increased risk. ? You will have tests every 1-10 years, depending on your results and the type of screening test.  Diabetes screening. ? This is done by checking your blood sugar (glucose) after you have not eaten for a while (fasting). ? You may have this done every 1-3 years.  STD (sexually transmitted disease) testing, if you are at risk. Follow these instructions at home: Eating and drinking  Eat a diet that includes fresh fruits and vegetables, whole grains, lean protein, and low-fat dairy products.  Take vitamin and mineral supplements as recommended by your health care provider.  Do not drink alcohol if your health care provider tells you not to drink.  If you drink alcohol: ? Limit how much you have to 0-2 drinks a day. ? Be aware of how much alcohol is in your drink. In the [place] one drink equals one 12 oz bottle of beer (355 mL), one 5 oz glass of wine (148 mL), or one 1 oz glass of hard liquor (44 mL).   Lifestyle  Take daily care of your teeth and gums. Brush your teeth every morning and night with fluoride toothpaste. Floss one time each day.  Stay active. Exercise  for at least 30 minutes 5 or more days each week.  Do not use any products that contain nicotine or tobacco, such as cigarettes, e-cigarettes, and chewing tobacco. If you need help quitting, ask your health care provider.  Do not use drugs.  If you are sexually active, practice safe sex. Use a condom or other form of protection to prevent STIs (sexually transmitted infections).  If told by your health care provider, take low-dose  aspirin daily starting at age 46.  Find healthy ways to cope with stress, such as: ? Meditation, yoga, or listening to music. ? Journaling. ? Talking to a trusted person. ? Spending time with friends and family. Safety  Always wear your seat belt while driving or riding in a vehicle.  Do not drive: ? If you have been drinking alcohol. Do not ride with someone who has been drinking. ? When you are tired or distracted. ? While texting.  Wear a helmet and other protective equipment during sports activities.  If you have firearms in your house, make sure you follow all gun safety procedures. What's next?  Go to your health care provider once a year for an annual wellness visit.  Ask your health care provider how often you should have your eyes and teeth checked.  Stay up to date on all vaccines. This information is not intended to replace advice given to you by your health care provider. Make sure you discuss any questions you have with your health care provider. Document Revised: 07/15/2019 Document Reviewed: 10/10/2018 Elsevier Patient Education  2021 [employer].

## 2020-12-14 NOTE — Progress Notes (Signed)
Patient ID: [name], male    DOB: 11-18-1975  MRN: [id number]  CC: Annual Physical Exam  Subjective: [name] is a 45 y.o. male who presents for annual physical exam.  His concerns today include: none  Patient Active Problem List   Diagnosis Date Noted  . Cancer of anterior two-thirds of tongue ([place]) 11/23/2020  . HTN (hypertension) 10/15/2020  . Influenza vaccine refused 09/21/2020  . Hypertension 09/21/2020  . Daily consumption of alcohol 09/13/2020  . 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 Tongue carcinoma ([place])      Current Outpatient Medications on File Prior to Visit  Medication Sig Dispense Refill  . chlorthalidone (HYGROTON) 25 MG tablet Take 1 tablet (25 mg total) by mouth daily. (Patient not taking: Reported on 11/23/2020) 90 tablet 3  . ibuprofen (ADVIL) 800 MG tablet Take 1 tablet (800 mg total) by mouth every 8 (eight) hours as needed. 30 tablet 0  . lidocaine (XYLOCAINE) 2 % solution Patient: Mix 1part 2% viscous lidocaine, 1part H20. Swish & swallow 32mL of diluted mixture, 38min before meals and at bedtime, up to QID 200 mL 3  . Nutritional Supplements (FEEDING SUPPLEMENT, OSMOLITE 1.5 CAL,) LIQD 1-1/2 cartons of Osmolite 1.5, 4 times daily through [facility] low-profile gastrostomy tube with 60 mL free water flush before and after each bolus feeding.  In addition flush 240 mL free water 3 times daily between feedings.  Provides 2130 cal, 89.4 g protein, 2286 mL free water/100% needs.  Please contact patient's aunt, Irwin Brakeman at [phone] for delivery information.  Send formula and supplies. (Patient taking differently: 1-1/2 cartons of Osmolite 1.5, 4 times daily through [facility] low-profile gastrostomy tube with 60 mL free water flush before and after each bolus feeding.  In addition flush 240 mL free water 3 times daily between feedings.  Provides 2130 cal, 89.4 g protein, 2286 mL free water/100% needs.  Please contact patient's aunt, Irwin Brakeman at  [phone] for delivery information.  Send formula and supplies.) 1422 mL 0  . oxyCODONE (OXY IR/ROXICODONE) 5 MG immediate release tablet Take 5 mg by mouth 2 (two) times daily as needed. (Patient not taking: Reported on 11/23/2020)     No current facility-administered medications on file prior to visit.    No Known Allergies  Social History   Socioeconomic History  . Marital status: Single    Spouse name: Not on file  . Number of children: Not on file  . Years of education: Not on file  . Highest education level: Not on file  Occupational History  . Not on file  Tobacco Use  . Smoking status: Former Smoker    Packs/day: 0.50    Types: Cigarettes  . Smokeless tobacco: Never Used  Vaping Use  . Vaping Use: Never used  Substance and Sexual Activity  . Alcohol use: Not Currently  . Drug use: Yes    Types: Marijuana  . Sexual activity: Not Currently  Other Topics Concern  . Not on file  Social History Narrative  . Not on file   Social Determinants of Health   Financial Resource Strain: Not on file  Food Insecurity: Not on file  Transportation Needs: No Transportation Needs  . Lack of Transportation (Medical): No  . Lack of Transportation (Non-Medical): No  Physical Activity: Not on file  Stress: Not on file  Social Connections: Not on file  Intimate Partner Violence: Not on file    Family History  Problem Relation Age of Onset  .  Lung cancer Mother   . Stroke Father   . Lung cancer Sister   . Lung cancer Maternal Grandmother     Past Surgical History:  Procedure Laterality Date  . DIRECT LARYNGOSCOPY N/A 09/05/2020   Procedure: DIRECT LARYNGOSCOPY;  Surgeon: Marcina Millard, MD;  Location: [place];  Service: ENT;  Laterality: N/A;  . ESOPHAGOSCOPY N/A 09/05/2020   Procedure: ESOPHAGOSCOPY;  Surgeon: Marcina Millard, MD;  Location: [place];  Service: ENT;  Laterality: N/A;  . FRACTURE SURGERY    . TONGUE BIOPSY N/A 09/05/2020   Procedure: TONGUE BIOPSY;   Surgeon: Marcina Millard, MD;  Location: [place];  Service: ENT;  Laterality: N/A;    ROS: Review of Systems Negative except as stated above  PHYSICAL EXAM: BP 97/64 (BP Location: Left Arm, Patient Position: Sitting)   Pulse (!) 110   Wt 131 lb 9.6 oz (59.7 kg)   SpO2 98%   BMI 19.43 kg/m   Wt Readings from Last 3 Encounters:  12/14/20 131 lb 9.6 oz (59.7 kg)  11/23/20 127 lb (57.6 kg)  11/23/20 128 lb 9.6 oz (58.3 kg)    Physical Exam Constitutional:      Appearance: He is normal weight.  HENT:     Head: Normocephalic and atraumatic.     Right Ear: Tympanic membrane, ear canal and external ear normal.     Left Ear: Tympanic membrane, ear canal and external ear normal.     Nose: Nose normal.     Mouth/Throat:     Mouth: Mucous membranes are moist.     Comments: Surgically reconstructed tongue present, endentulous. Eyes:     Extraocular Movements: Extraocular movements intact.     Conjunctiva/sclera: Conjunctivae normal.     Pupils: Pupils are equal, round, and reactive to light.  Cardiovascular:     Rate and Rhythm: Tachycardia present.     Pulses: Normal pulses.     Heart sounds: Normal heart sounds.  Pulmonary:     Effort: Pulmonary effort is normal.     Breath sounds: Normal breath sounds.  Abdominal:     General: Bowel sounds are normal.     Comments: G-tube placement of abdomen, surrounding skin clean, dry, and intact.   Genitourinary:    Comments: Patient declined examination.  Musculoskeletal:        General: Normal range of motion.     Cervical back: Normal range of motion and neck supple.  Skin:    General: Skin is warm and dry.     Capillary Refill: Capillary refill takes less than 2 seconds.  Neurological:     General: No focal deficit present.     Mental Status: He is alert.  Psychiatric:        Mood and Affect: Mood normal.        Behavior: Behavior normal.    ASSESSMENT AND PLAN: 1. Annual physical exam: - Counseled on 150 minutes of  exercise per week as tolerated,STI prevention, and routine healthcare maintenance.  2. Screening for metabolic disorder: - Last BMP obtained 12/13/2020.  3. Diabetes mellitus screening: - Hemoglobin A1c to screen for pre-diabetes/diabetes. - Hemoglobin A1c  4. Screening cholesterol level: - Lipid panel to screen for high cholesterol.  - Lipid Panel  5. Screening for deficiency anemia: - Last CBC obtained 12/13/2020.  6. Thyroid disorder screen: - TSH to check thyroid function.  - TSH  7. Need for hepatitis C screening test: - HCV antibody to screen for hepatitis C.  -  HCV Ab w/Rflx to Verification  8. Colon cancer screening: - Referral to Gastroenterology for colon cancer screening by colonoscopy. - Ambulatory referral to Gastroenterology   Patient was given the opportunity to ask questions.  Patient verbalized understanding of the plan and was able to repeat key elements of the plan. Patient was given clear instructions to go to Emergency Department or return to medical center if symptoms don't improve, worsen, or new problems develop.The patient verbalized understanding.   Orders Placed This Encounter  Procedures  . HCV Ab w/Rflx to Verification  . TSH  . Lipid Panel  . Hemoglobin A1c  . Interpretation:  . Ambulatory referral to Gastroenterology     Requested Prescriptions    No prescriptions requested or ordered in this encounter    Follow-up with primary provider as needed.   Camillia Herter, NP

## 2020-12-15 ENCOUNTER — Inpatient Hospital Stay: Payer: Medicaid Other | Admitting: Nutrition

## 2020-12-15 ENCOUNTER — Encounter: Payer: Self-pay | Admitting: Nutrition

## 2020-12-15 ENCOUNTER — Telehealth: Payer: Self-pay | Admitting: Hematology and Oncology

## 2020-12-15 ENCOUNTER — Ambulatory Visit
Admission: RE | Admit: 2020-12-15 | Discharge: 2020-12-15 | Disposition: A | Discharge status: Home / Self Care | Payer: Medicaid Other | Source: Ambulatory Visit | Attending: Radiation Oncology | Admitting: Radiation Oncology

## 2020-12-15 DIAGNOSIS — Z510 Encounter for antineoplastic radiation therapy: Secondary | ICD-10-CM | POA: Diagnosis not present

## 2020-12-15 LAB — LIPID PANEL
Chol/HDL Ratio: 4.9 ratio (ref 0.0–5.0)
Cholesterol, Total: 108 mg/dL (ref 100–199)
HDL: 22 mg/dL — ABNORMAL LOW (ref >39)
LDL Chol Calc (NIH): 72 mg/dL (ref 0–99)
Triglycerides: 67 mg/dL (ref 0–149)
VLDL Cholesterol Cal: 14 mg/dL (ref 5–40)

## 2020-12-15 LAB — TSH: TSH: 0.985 u[IU]/mL (ref 0.450–4.500)

## 2020-12-15 LAB — HCV INTERPRETATION

## 2020-12-15 LAB — HCV AB W/RFLX TO VERIFICATION: HCV Ab: <0.1 s/co ratio (ref 0.0–0.9)

## 2020-12-15 LAB — HEMOGLOBIN A1C
Est. average glucose Bld gHb Est-mCnc: 114 mg/dL
Hgb A1c MFr Bld: 5.6 % (ref 4.8–5.6)

## 2020-12-15 NOTE — Progress Notes (Signed)
Did not show up for nutrition appointment.

## 2020-12-15 NOTE — Telephone Encounter (Signed)
Scheduled appointment per 2/15 inbasket msg from Dr. Chryl Heck. Spoke to patient who is aware of appointment date and time.

## 2020-12-15 NOTE — Progress Notes (Signed)
Please call patient with update.   Thyroid normal.   No pre-diabetes/diabetes.   Hepatitis C negative.   HDL, sometimes referred to as "bad cholesterol", lower than normal. This may increase risk of heart attack and/or stroke. Moderate intensity exercise at least 150 minutes as tolerated per week may help.

## 2020-12-16 ENCOUNTER — Ambulatory Visit: Payer: Medicaid Other | Attending: Radiation Oncology | Admitting: Physical Therapy

## 2020-12-16 ENCOUNTER — Other Ambulatory Visit: Payer: Self-pay

## 2020-12-16 ENCOUNTER — Ambulatory Visit: Payer: Medicaid Other

## 2020-12-16 ENCOUNTER — Ambulatory Visit
Admission: RE | Admit: 2020-12-16 | Discharge: 2020-12-16 | Disposition: A | Discharge status: Home / Self Care | Payer: Medicaid Other | Source: Ambulatory Visit | Attending: Radiation Oncology | Admitting: Radiation Oncology

## 2020-12-16 ENCOUNTER — Ambulatory Visit: Payer: Self-pay | Admitting: Physical Therapy

## 2020-12-16 ENCOUNTER — Encounter: Payer: Self-pay | Admitting: Physical Therapy

## 2020-12-16 ENCOUNTER — Inpatient Hospital Stay (HOSPITAL_BASED_OUTPATIENT_CLINIC_OR_DEPARTMENT_OTHER): Payer: Medicaid Other | Admitting: Hematology and Oncology

## 2020-12-16 ENCOUNTER — Encounter: Payer: Self-pay | Admitting: General Practice

## 2020-12-16 ENCOUNTER — Encounter: Payer: Self-pay | Admitting: Hematology and Oncology

## 2020-12-16 VITALS — BP 109/76 | HR 95 | Temp 96.9°F | Resp 18 | Ht 69.0 in | Wt 130.6 lb

## 2020-12-16 DIAGNOSIS — Z823 Family history of stroke: Secondary | ICD-10-CM | POA: Diagnosis not present

## 2020-12-16 DIAGNOSIS — Z87891 Personal history of nicotine dependence: Secondary | ICD-10-CM | POA: Diagnosis not present

## 2020-12-16 DIAGNOSIS — C029 Malignant neoplasm of tongue, unspecified: Secondary | ICD-10-CM | POA: Insufficient documentation

## 2020-12-16 DIAGNOSIS — R1312 Dysphagia, oropharyngeal phase: Secondary | ICD-10-CM | POA: Diagnosis present

## 2020-12-16 DIAGNOSIS — R293 Abnormal posture: Secondary | ICD-10-CM | POA: Diagnosis present

## 2020-12-16 DIAGNOSIS — Z79899 Other long term (current) drug therapy: Secondary | ICD-10-CM | POA: Diagnosis not present

## 2020-12-16 DIAGNOSIS — D649 Anemia, unspecified: Secondary | ICD-10-CM | POA: Diagnosis not present

## 2020-12-16 DIAGNOSIS — Z801 Family history of malignant neoplasm of trachea, bronchus and lung: Secondary | ICD-10-CM | POA: Diagnosis not present

## 2020-12-16 DIAGNOSIS — R471 Dysarthria and anarthria: Secondary | ICD-10-CM | POA: Insufficient documentation

## 2020-12-16 DIAGNOSIS — Z510 Encounter for antineoplastic radiation therapy: Secondary | ICD-10-CM | POA: Diagnosis not present

## 2020-12-16 DIAGNOSIS — C01 Malignant neoplasm of base of tongue: Secondary | ICD-10-CM | POA: Diagnosis present

## 2020-12-16 DIAGNOSIS — D509 Iron deficiency anemia, unspecified: Secondary | ICD-10-CM

## 2020-12-16 MED ORDER — CYANOCOBALAMIN 1000 MCG/ML IJ SOLN: 1000.0000 ug | Freq: Once | INTRAMUSCULAR | Status: DC

## 2020-12-16 MED ORDER — B-12 1000 MCG SL SUBL: 1000.0000 ug | SUBLINGUAL_TABLET | Freq: Every day | SUBLINGUAL | 2 refills | Status: DC

## 2020-12-16 MED ORDER — FERROUS SULFATE 325 (65 FE) MG PO TBEC: 325.0000 mg | DELAYED_RELEASE_TABLET | Freq: Two times a day (BID) | ORAL | 2 refills | Status: DC

## 2020-12-16 NOTE — Patient Instructions (Addendum)
  You will need to overarticulate, or over-enunciate for people to understand you better, like you did with me today.   EVERY DAY:  1) Swallow with ice chips (a few at a time), 3-5 times a day, for 10-15  minutes or until you feel the muscles in the throat get tired  [place] DO THIS -Put in the ice chips and swallow 3-4 times as you let them melt -Clear your throat STRONG after the last swallow -Suction if you need to - or, hopefully the liquid will have gone down into your stomach    2) Do the exercises Hoyle Sauer gave you, twice-three times a day

## 2020-12-16 NOTE — Progress Notes (Signed)
[facility] CONSULT NOTE  Patient Care Team: Pcp, No as PCP - General  CHIEF COMPLAINTS/PURPOSE OF CONSULTATION:  Squamous cell carcinoma of oral cavity  ASSESSMENT & PLAN:  No problem-specific Assessment & Plan notes found for this encounter.  No orders of the defined types were placed in this encounter.  1. [name] is a 45 y.o. 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 Now s/p total glossectomy via mandibular split approach, partial pharyngectomy, bilateral neck dissection, ALT free flap reconstruction on 10/15/20. Final pathology showed 5.4 cms tumor with DOI of 2.6 cms. No evidence of LVI or perineural invasion, margins free of tumor, closest approach is 0.3 cms from posterior soft tissue margin. 54 LN negative. He saw Dr Isidore Moos today for consideration of adjuvant radiation. We have recommended to continue with radiation alone, no indication for chemotherapy  2. Normocytic normochromic anemia Some evidence of iron deficiency, B12 at lower limits of normal likely from inadequate nutritional intake. BMP with normal creatinine and calcium, less concern for monoclonal gammopathy. I have reocmmended he start oral iron supplementation via the G tube and B12 SL daily and RTC in 4 weeks with repeat labs. He denies any hematochezia or melena. We will continue to monitor.  HISTORY OF PRESENTING ILLNESS:   [name] 45 y.o. male is here because of new diagnosis of left tongue cancer.   He arrived with his family member today to the appointment.  He tells me that he first noticed some pain in the left tongue but attributed this to back to scraping his tongue.  He went in to see his dentist and was found to have this left tongue mass which was biopsied back in November and demonstrated invasive squamous cell carcinoma.  He then underwent MRI imaging  and surgery at [facility], had total glossectomy, partial pharyngectomy, bilateral neck dissection and is following up with medical oncology and radiation oncology for adjuvant recommendations.  Biopsies revealed:  09/05/2020 FINAL MICROSCOPIC DIAGNOSIS:  A. TONGUE, LEFT BASE, BIOPSY:  - Invasive squamous cell carcinoma.  COMMENT:  There is no perineural or lymphovascular invasion. The carcinoma appears well differentiated ADDENDUM:  P16 is only focally and weakly positive in the invasive tumor (<5%).  10/12/2020 IMPRESSION: Large mass within the tongue apparently restricted to the left side, without evidence of crossing of the tongue midline. Lesion in total measures about 7 cm from front to back. Lesion extends all the way from the superior surface of the anterior tongue back to the posterior tongue and base of the left. Tumor bulges into the sublingual region on the left. This appears very similar to the previous CT, as best as the 2 techniques can be compared. Invasion of the left submandibular gland is not demonstrated. There is edematous change of the adjacent soft tissues, questionably secondary to regional radiation. Mild edema and enhancement of level 2 nodes on the left likely related to radiation. No evidence of  cervical lymph node enlargement or necrosis.  12/17. Had total glossectomy via mandibular split approach, partial pharyngectomy, bilateral neck dissection, ALT free flap reconstruction on 10/15/20. Final pathology showed 5.4 cms tumor with DOI of 2.6 cms. No evidence of LVI or perineural invasion, margins free of tumor, closest approach is 0.3 cms from posterior soft tissue margin. 54 LN negative. He is now undergoing radiation  We made him a FU here to  monitor the anemia Radiation going well.  Using G tube for feeding. No hematochezia or melena. No change in urinary habits. He feels good, doesn't take any medications.  REVIEW OF SYSTEMS:   Constitutional:  Denies fevers, chills or abnormal night sweats Eyes: Denies blurriness of vision, double vision or watery eyes Ears, nose, mouth, throat, and face: Denies mucositis or sore throat Respiratory: Denies cough, dyspnea or wheezes Cardiovascular: Denies palpitation, chest discomfort or lower extremity swelling Gastrointestinal:  Denies nausea, heartburn or change in bowel habits Skin: Denies abnormal skin rashes Lymphatics: Denies new lymphadenopathy or easy bruising Neurological:Denies numbness, tingling or new weaknesses Behavioral/Psych: Mood is stable, no new changes  All other systems were reviewed with the patient and are negative.  MEDICAL HISTORY:  Past Medical History:  Diagnosis Date  . Cancer ([place])    tongue    SURGICAL HISTORY: Past Surgical History:  Procedure Laterality Date  . DIRECT LARYNGOSCOPY N/A 09/05/2020   Procedure: DIRECT LARYNGOSCOPY;  Surgeon: Marcina Millard, MD;  Location: [place];  Service: ENT;  Laterality: N/A;  . ESOPHAGOSCOPY N/A 09/05/2020   Procedure: ESOPHAGOSCOPY;  Surgeon: Marcina Millard, MD;  Location: [place];  Service: ENT;  Laterality: N/A;  . FRACTURE SURGERY    . TONGUE BIOPSY N/A 09/05/2020   Procedure: TONGUE BIOPSY;  Surgeon: Marcina Millard, MD;  Location: [facility];  Service: ENT;  Laterality: N/A;    SOCIAL HISTORY: Social History   Socioeconomic History  . Marital status: Single    Spouse name: Not on file  . Number of children: Not on file  . Years of education: Not on file  . Highest education level: Not on file  Occupational History  . Not on file  Tobacco Use  . Smoking status: Former Smoker    Packs/day: 1PPD for 25 yrs    Types: Cigarettes  . Smokeless tobacco: Never Used  Vaping Use  . Vaping Use: Never used  Substance and Sexual Activity  . Alcohol use: Not Currently  . Drug use: Yes    Types: Marijuana  . Sexual activity: Not Currently  Other Topics Concern  . Not on file  Social History Narrative  . Not  on file   Social Determinants of Health   Financial Resource Strain: Not on file  Food Insecurity: Not on file  Transportation Needs: Not on file  Physical Activity: Not on file  Stress: Not on file  Social Connections: Not on file  Intimate Partner Violence: Not on file    FAMILY HISTORY: Family History  Problem Relation Age of Onset  . Lung cancer Mother   . Stroke Father   . Lung cancer Sister   . Lung cancer Maternal Grandmother     ALLERGIES:  has No Known Allergies.  MEDICATIONS:  Current Outpatient Medications  Medication Sig Dispense Refill  . chlorthalidone (HYGROTON) 25 MG tablet Take 1 tablet (25 mg total) by mouth daily. (Patient not taking: Reported on 11/23/2020) 90 tablet 3  . ibuprofen (ADVIL) 800 MG tablet Take 1 tablet (800 mg total) by mouth every 8 (eight) hours as needed. 30 tablet 0  . lidocaine (XYLOCAINE) 2 % solution Patient: Mix 1part 2% viscous lidocaine, 1part H20. Swish & swallow 71mL of diluted mixture, 98min before meals and at bedtime, up to QID 200 mL 3  . Nutritional Supplements (FEEDING SUPPLEMENT, OSMOLITE 1.5 CAL,) LIQD 1-1/2 cartons of Osmolite 1.5, 4 times daily through [facility] low-profile gastrostomy tube with 60 mL free water flush before  and after each bolus feeding.  In addition flush 240 mL free water 3 times daily between feedings.  Provides 2130 cal, 89.4 g protein, 2286 mL free water/100% needs.  Please contact patient's aunt, Irwin Brakeman at [phone] for delivery information.  Send formula and supplies. (Patient taking differently: 1-1/2 cartons of Osmolite 1.5, 4 times daily through [facility] low-profile gastrostomy tube with 60 mL free water flush before and after each bolus feeding.  In addition flush 240 mL free water 3 times daily between feedings.  Provides 2130 cal, 89.4 g protein, 2286 mL free water/100% needs.  Please contact patient's aunt, Irwin Brakeman at [phone] for delivery information.  Send formula and supplies.) 1422 mL 0  .  oxyCODONE (OXY IR/ROXICODONE) 5 MG immediate release tablet Take 5 mg by mouth 2 (two) times daily as needed. (Patient not taking: Reported on 11/23/2020)     No current facility-administered medications for this visit.     PHYSICAL EXAMINATION: ECOG PERFORMANCE STATUS: 0 - Asymptomatic  Vitals:   12/16/20 1306  BP: 109/76  Pulse: 95  Resp: 18  Temp: (!) 96.9 F (36.1 C)  SpO2: 100%   Filed Weights   12/16/20 1306  Weight: 130 lb 9.6 oz (59.2 kg)    GENERAL:alert, no distress and comfortable SKIN: skin color, texture, turgor are normal, no rashes or significant lesions EYES: normal, conjunctiva are pink and non-injected, sclera clear OROPHARYNX: Graft noted, healing well. NECK: Postop surgical scar bilateral neck noted LUNGS: clear to auscultation and percussion with normal breathing effort HEART: regular rate & rhythm and no murmurs and no lower extremity edema ABDOMEN:abdomen soft, non-tender and normal bowel sounds Musculoskeletal:no cyanosis of digits and no clubbing  PSYCH: alert & oriented x 3 with fluent speech NEURO: no focal motor/sensory deficits  LABORATORY DATA:  I have reviewed the data as listed Lab Results  Component Value Date   WBC 6.7 12/13/2020   HGB 8.4 (L) 12/13/2020   HCT 27.3 (L) 12/13/2020   MCV 79.8 (L) 12/13/2020   PLT 401 (H) 12/13/2020     Chemistry      Component Value Date/Time   NA 136 12/13/2020 1301   K 3.9 12/13/2020 1301   CL 99 12/13/2020 1301   CO2 27 12/13/2020 1301   BUN 12 12/13/2020 1301   CREATININE 0.66 12/13/2020 1301      Component Value Date/Time   CALCIUM 10.3 12/13/2020 1301   ALKPHOS 73 11/09/2018 1126   AST 57 (H) 11/09/2018 1126   ALT 34 11/09/2018 1126   BILITOT 1.9 (H) 11/09/2018 1126     I have reviewed pertinent labs.  RADIOGRAPHIC STUDIES: I have personally reviewed the radiological images as listed and agreed with the findings in the report. No results found.  All questions were answered. The  patient knows to call the clinic with any problems, questions or concerns.      Benay Pike, MD 12/16/2020 1:14 PM

## 2020-12-16 NOTE — Progress Notes (Signed)
[place] CSW Progress Notes  Brief check in visit w patient during [facility], provided packet of information on [place] and [employer].  He has my card and was encouraged to reach out as needed for support/resources.  He is using [employer] for appointments, has gotten Medicaid.  He is unsure whether he will receive Social Security disability - per record, he applied on his own for disability and Medicaid.  CSW will check w aunt on status of this.  Had appt w PCP for general medical care this week.  No further concerns at this time.  Edwyna Shell, LCSW Clinical Social Worker Phone:  [phone]

## 2020-12-16 NOTE — Therapy (Signed)
[facility] [address] [place], [place], [address] Phone: [phone]   Fax:  [phone]  Speech Language Pathology Evaluation  Patient Details  Name: [name] MRN: [id number] Date of Birth: December 09, 1975 Referring Provider (SLP): Eppie Gibson, MD   Encounter Date: 12/16/2020   End of Session - 12/16/20 1520    Visit Number 1    Number of Visits 10    Date for SLP Re-Evaluation 03/16/21    SLP Start Time 1210    SLP Stop Time  1250    SLP Time Calculation (min) 40 min    Activity Tolerance Patient tolerated treatment well           Past Medical History:  Diagnosis Date  . Cancer ([place])    tongue    Past Surgical History:  Procedure Laterality Date  . DIRECT LARYNGOSCOPY N/A 09/05/2020   Procedure: DIRECT LARYNGOSCOPY;  Surgeon: Marcina Millard, MD;  Location: [facility];  Service: ENT;  Laterality: N/A;  . ESOPHAGOSCOPY N/A 09/05/2020   Procedure: ESOPHAGOSCOPY;  Surgeon: Marcina Millard, MD;  Location: [facility];  Service: ENT;  Laterality: N/A;  . FRACTURE SURGERY    . TONGUE BIOPSY N/A 09/05/2020   Procedure: TONGUE BIOPSY;  Surgeon: Marcina Millard, MD;  Location: [facility];  Service: ENT;  Laterality: N/A;    There were no vitals filed for this visit.   Subjective Assessment - 12/16/20 1427    Subjective Pt has had PEG placed since 10-15-20 after tracheostomy, mandibulotomy, partial left pharyngectomy, total glossectomy, bil modified radical neck dissection, and ALT free flap.    Currently in Pain? No/denies              SLP Evaluation [facility] - 12/16/20 1427      SLP Visit Information   SLP Received On 12/16/20    Referring Provider (SLP) Eppie Gibson, MD    Onset Date Novemeber 2021    Medical Diagnosis SCCA of tongue (stage IVA)      Subjective   Patient/Family Stated Goal Pt would like to eat safely      General Information   HPI ED presentation 09-05-20 with lt jaw/dental, lower  tongue pain. Direct larygoscopy and biopsy revealed well differentiated invasive SCCA. 10-12-20 revealed large mass within tongue restricted to lt side without evidence of crossing midline tongue.Lesion 7 cm and extended from superior surface of anterior tongue to posterior tongue and base on lt. 10-15-20 Dr. Conley Canal performed trach, mandibulotomy, partial lt pharynectomy, total glossectomy, bil modified radical neck dissection and ALT free flap.      Prior Functional Status   Cognitive/Linguistic Baseline Within functional limits      Cognition   Overall Cognitive Status Within Functional Limits for tasks assessed      Auditory Comprehension   Overall Auditory Comprehension Appears within functional limits for tasks assessed      Oral Motor/Sensory Function   Overall Oral Motor/Sensory Function Impaired    Labial ROM Within Functional Limits    Lingual ROM Other (Comment)   none   Lingual Symmetry Abnormal symmetry right;Abnormal symmetry left    Lingual Strength Other (comment)   none   Lingual Sensation Other (Comment)   none   Lingual Coordination Other (comment)   none   Overall Oral Motor/Sensory Function Pt with no dorsal tongue post-sx.      Motor Speech   Overall Motor Speech Impaired    Articulation Impaired    Level of Impairment  Word    Intelligibility Intelligibility reduced    Word 50-74% accurate    Phrase 50-74% accurate    Sentence 50-74% accurate    Conversation 25-49% accurate            Pt currently is NPO and obtaining nutrition and hydration via PEG since 10-15-20. Because pt did not perform oral care prior to visit today, no POs were attempted in an effort to maintain pt's best pulmonary health.  Pt has not performed HEP from [facility] nor has begun ice chip trials to date.  FEES performed by SLP Alfonse Spruce at [facility] on 11-30-19- results as follows: "Patient with severe oropharyngeal dysphagia secondary to history of HNC and recent surgical intervention. Oral  stage is marked by absent lingual ROM/control s/p total glossectomy, resulting in diminished oral sensation and oral residual after the swallow. Pharyngeal stage is marked by significantly decreased bolus propulsion, incomplete airway closure, and likely decreased UES distension, resulting in severe post-swallow residual with subsequent aspiration after that was not mitigated with spontaneous and volitional throat clearing. Trialed puree; however, patient continued to have aggressive cough response with previous thin trial. Scope was removed, and instructed patient to hock up secretions and liquid. Based on today's findings, no safe oral diet can be recommended at this time. Recommend patient continue nutrition/hydration/medication through G-tube.  Patient may have ice chips following meticulous oral care and adherence to immediate post-swallow clearing to maintain function. Note, patient will be receving CXRT at [facility]."  Today, SLP reinforced these findings highlighting the usage of ice chips after meticulous oral care, and aspiration precautions including post-swallow clearing of throat. SLP further told pt to suction oral/pharyngeal residual and "hock" PRN in order to clear residue from oral/pharyngeal cavities.   Pt did not bring HEP to visit today - SLP to meet with pt in approx two weeks and pt stated he will bring HEP obtained from [facility] at that time.   Because data states the risk for dysphagia during and after radiation treatment increases due to undergoing radiation tx, SLP taught pt about complications of aspiration PNA.   SLP educated pt re: changes to swallowing musculature after rad tx, and why adherence to dysphagia HEP from [facility] and PO consumption of ice chips after meticulous oral care x3-5/day for 10-15 mintues (or until swallowing muscles freel fatigued) was necessary to inhibit muscular disuse atrophy and to reduce muscle fibrosis following rad tx. Pt demonstrated  understanding of these things to SLP.                  SLP Education - 12/16/20 1449    Education Details need for meticulous oral care prior to PO ice chips, directions for ice chips, directions for HEP x2/day, please bring HEP next visit, late effects head/neck radiation on swallowing function    Person(s) Educated Patient    Methods Explanation;Demonstration;Verbal cues;Handout    Comprehension Verbalized understanding;Tactile cues required;Returned demonstration;Need further instruction            SLP Short Term Goals - 12/16/20 1527      SLP SHORT TERM GOAL #1   Title Pt will complete HEP with rare min A over two sessions    Time 4    Period --   sessions, for all STGs   Status New      SLP SHORT TERM GOAL #2   Title Pt will tell SLP 3 overt s/s aspiration PNA with modified independence    Time 2  Status New      SLP SHORT TERM GOAL #3   Title Pt will tell SLP rationale for HEP completion    Time 2    Status New            SLP Long Term Goals - 12/16/20 1529      SLP LONG TERM GOAL #1   Title Pt will complete HEP with modified independence over 2 visits    Time 4    Period --   sessions, for all LTGs   Status New      SLP LONG TERM GOAL #2   Title pt will perform safety precautions from FEES with PO ice chips with modified independence 2 sessions    Time 4    Status New      SLP LONG TERM GOAL #3   Title Pt will tell SLP when to decr frequency of HEP    Time 5    Status New            Plan - 12/16/20 1521    Clinical Impression Statement Pt presents today PEG tube dependent following significant oral and phayrngeal surgery in December requiring him to be NPO currently. Pt was given HEP at [facility] however did not bring with him today. See abbreviated FEES results from 11-29-20 above. No overt s/sx aspiration PNA reported or observed today. Data suggest that pt's swallowing is threatened by muscle fibrosis that could develop after  rad/chemorad is completed. Muscle disuse atrophy is also possible as pt has been PEG dependent for 8 weeks. Therefore, SLP highly stressed pt complete the indivdualized exercise program he rec'd at [facility] (pt to bring in next session with this SLP) to mitigate/eliminate pt's muscle fibrosis following and as a result of, pt's rad tx. PT will perform swallowing with ice chips x3-5/day, for 10-15 minutes or until muscles feel tired. SLP highly stressed pt's need to perform oral care < 30 minutes prior toinitiating PO ice chips. Skilled ST would be beneficial to the pt in order to regularly assess pt's safety with POs and/or need for instrumental swallow assessment, as well as to assess accurate completion of swallowing HEP.    Speech Therapy Frequency --   approx x2/month   Duration --   10 visits   Treatment/Interventions Aspiration precaution training;Pharyngeal strengthening exercises;Diet toleration management by SLP;Trials of upgraded texture/liquids;Patient/family education;Internal/external aids;SLP instruction and feedback;Compensatory strategies;Other (comment)    Potential to Achieve Goals Fair    Potential Considerations Severity of impairments;Previous level of function    SLP Home Exercise Plan pt rec'd at [facility] after FEES 11-29-20    Consulted and Agree with Plan of Care Patient           Patient will benefit from skilled therapeutic intervention in order to improve the following deficits and impairments:   Dysphagia, oropharyngeal phase  Severe dysarthria     [employer] Authorization   Choose one: Neuro Rehabilitative  Standardized Assessment or Functional Outcome Tool: FEES 11-29-20 at [facility]  Score or Percent Disability: SEVERE    Problem List Patient Active Problem List   Diagnosis Date Noted  . Cancer of anterior two-thirds of tongue ([place]) 11/23/2020  . HTN (hypertension) 10/15/2020  . Influenza vaccine refused 09/21/2020  . Hypertension 09/21/2020  . Daily  consumption of alcohol 09/13/2020  . Squamous cell cancer of tongue ([place]) 09/13/2020  . Tongue carcinoma ([place])     [place] ,[place], [place]  12/16/2020, 3:33 PM  [facility] [phone]  [place], [place], [address] Phone: [phone]   Fax:  [phone]  Name: BANYAN GOODCHILD MRN: [id number] Date of Birth: 1976-04-13

## 2020-12-16 NOTE — Therapy (Signed)
[facility], [place], [address] Phone: [phone]   Fax:  [phone]  Physical Therapy Evaluation  Patient Details  Name: [name] MRN: [id number] Date of Birth: 02-Mar-1976 Referring Provider (PT): Reita May Date: 12/16/2020   PT End of Session - 12/16/20 1114    Visit Number 1    Number of Visits 2    Date for PT Re-Evaluation 02/10/21    PT Start Time 1120    PT Stop Time 1145    PT Time Calculation (min) 25 min    Activity Tolerance Patient tolerated treatment well    Behavior During Therapy [facility] for tasks assessed/performed           Past Medical History:  Diagnosis Date  . Cancer ([place])    tongue    Past Surgical History:  Procedure Laterality Date  . DIRECT LARYNGOSCOPY N/A 09/05/2020   Procedure: DIRECT LARYNGOSCOPY;  Surgeon: Marcina Millard, MD;  Location: [facility];  Service: ENT;  Laterality: N/A;  . ESOPHAGOSCOPY N/A 09/05/2020   Procedure: ESOPHAGOSCOPY;  Surgeon: Marcina Millard, MD;  Location: [facility];  Service: ENT;  Laterality: N/A;  . FRACTURE SURGERY    . TONGUE BIOPSY N/A 09/05/2020   Procedure: TONGUE BIOPSY;  Surgeon: Marcina Millard, MD;  Location: [facility];  Service: ENT;  Laterality: N/A;    There were no vitals filed for this visit.    Subjective Assessment - 12/16/20 1114    Subjective I am feeling pretty good. No problems.    Pertinent History SCC of tongue Stage IVA, 09/15/20 performed direct laryngoscopy with biopsy with pathology revealing well differentiated invasive squamous cell carcinoma without perineural or lymphovascular invasion, 10/12/20- MRI revealed large mass within the tongue without evidence of crossing midline, tumor bulged into sublingunal region on the L, 10/15/20- tracheostomy, mandibulotomy, partial left pharyngectomy, total glossectomy, bilateral mRND and ALT free flap, will receive radiation to tongue/tumor bed and bilateral  neck, started on 12/09/20 and will complete 01/19/21. PEG and dental extractions performed during surgery.    Patient Stated Goals to gain info from providers    Currently in Pain? No/denies    Multiple Pain Sites No              [facility] PT Assessment - 12/16/20 0001      Assessment   Medical Diagnosis SCC of tongue    Referring Provider (PT) [place]    Onset Date/Surgical Date 10/12/20    Hand Dominance Right    Prior Therapy none      Precautions   Precautions Other (comment)    Precaution Comments active cancer      Restrictions   Weight Bearing Restrictions No      Balance Screen   Has the patient fallen in the past 6 months No    Has the patient had a decrease in activity level because of a fear of falling?  No    Is the patient reluctant to leave their home because of a fear of falling?  No      Home Environment   Living Environment Private residence    Living Arrangements Alone    Available Help at Discharge Family    Type of Home Apartment      Prior Function   Level of [place] On disability    Leisure walks about 8 or 9 minutes once a day      Cognition  Overall Cognitive Status Within Functional Limits for tasks assessed      Functional Tests   Functional tests Sit to Stand      Sit to Stand   Comments 30 sec sit to stand: 15 reps which is poor for his age      Posture/Postural Control   Posture/Postural Control Postural limitations    Postural Limitations Forward head      ROM / Strength   AROM / PROM / Strength AROM      AROM   Overall AROM  Within functional limits for tasks performed    AROM Assessment Site Cervical    Cervical Flexion WFL    Cervical Extension WFL    Cervical - Right Side Bend 25% limited    Cervical - Left Side Bend 25% limited    Cervical - Right Rotation WFL    Cervical - Left Rotation [facility]      Ambulation/Gait   Ambulation/Gait Yes    Ambulation/Gait Assistance 7: Independent     Ambulation Distance (Feet) 10 Feet    Gait Pattern Within Functional Limits             LYMPHEDEMA/ONCOLOGY QUESTIONNAIRE - 12/16/20 0001      Lymphedema Assessments   Lymphedema Assessments Head and Neck      Head and Neck   4 cm superior to sternal notch around neck 36.6 cm    6 cm superior to sternal notch around neck 36.1 cm    8 cm superior to sternal notch around neck 37.3 cm                   Objective measurements completed on examination: See above findings.               PT Education - 12/16/20 1114    Education Details Neck ROM, importance of posture when sitting, standing and lying down, deep breathing, walking program and importance of staying active throughout treatment, CURE article on staying active, "Why exercise?" flyer, lymphedema and PT info    Person(s) Educated Patient    Methods Explanation;Handout    Comprehension Verbalized understanding               PT Long Term Goals - 12/16/20 1116      PT LONG TERM GOAL #1   Title Pt will return to baseline cervical ROM measurements and not demonstrate any signs or symptoms of lymphedema.    Time 8    Period Weeks    Status New    Target Date 02/10/21              Head and Neck Clinic Goals - 12/16/20 1116      Patient will be able to verbalize understanding of a home exercise program for cervical range of motion, posture, and walking.    Time 1    Period Days    Status Achieved      Patient will be able to verbalize understanding of proper sitting and standing posture.    Time 1    Period Days    Status Achieved      Patient will be able to verbalize understanding of lymphedema risk and availability of treatment for this condition.    Time 1    Period Days    Status Achieved              Plan - 12/16/20 1115    Clinical Impression Statement Pt presents to PT with newly diagnosed  SCC of tongue. Pt's shoulder and cervical ROM are WFL. He does  have anterior neck swelling still present since surgery. Educated pt about signs and symptoms of lymphedema as well as anatomy and physiology of lymphatic system. Educated pt in importance of staying as active as possible throughout treatment to decrease fatigue as well as head and neck ROM exercises to decrease loss of ROM. Will see pt after completion of radiation to reassess ROM and assess for lymphedema to determine therapy needs at that time.    Stability/Clinical Decision Making Stable/Uncomplicated    Clinical Decision Making Low    Rehab Potential Good    PT Frequency --   eval and 1 f/u visit   PT Duration 8 weeks    PT Treatment/Interventions ADLs/Self Care Home Management;Patient/family education;Therapeutic exercise    PT Next Visit Plan reassess baselines    PT Home Exercise Plan head and neck ROM exercises    Consulted and Agree with Plan of Care Patient           Patient will benefit from skilled therapeutic intervention in order to improve the following deficits and impairments:  Postural dysfunction,Decreased knowledge of precautions  Visit Diagnosis: Abnormal posture  Squamous cell carcinoma of tongue ([place])     Problem List Patient Active Problem List   Diagnosis Date Noted  . Cancer of anterior two-thirds of tongue ([place]) 11/23/2020  . HTN (hypertension) 10/15/2020  . Influenza vaccine refused 09/21/2020  . Hypertension 09/21/2020  . Daily consumption of alcohol 09/13/2020  . Squamous cell cancer of tongue ([place]) 09/13/2020  . Tongue carcinoma [facility])     Allyson Sabal [facility] 12/16/2020, 11:49 AM  [place], [place], [address] Phone: [phone]   Fax:  [phone]  Name: TYSHAN ENDERLE MRN: [id number] Date of Birth: November 13, 1975  Manus Gunning, PT 12/16/20 11:49 AM

## 2020-12-16 NOTE — Progress Notes (Signed)
Oncology Nurse Navigator Documentation  I met with [name] during his appointments today with Garald Balding SLP and Allyson Sabal PT during Head and Neck MDC. He is tolerating treatment well and denies any concerns or questions at this time. He knows to call me if he has any needs that I can help him with.   Harlow Asa RN, BSN, OCN Head & Neck Oncology Nurse [place] at [facility] Phone # [phone]  Fax # [phone]

## 2020-12-16 NOTE — Progress Notes (Signed)
Cancel B12 inj per MD.  Kennith Center, Pharm.D., CPP 12/16/2020@2 :21 PM

## 2020-12-17 ENCOUNTER — Telehealth: Payer: Self-pay | Admitting: Hematology and Oncology

## 2020-12-17 ENCOUNTER — Ambulatory Visit
Admission: RE | Admit: 2020-12-17 | Discharge: 2020-12-17 | Disposition: A | Discharge status: Home / Self Care | Payer: Medicaid Other | Source: Ambulatory Visit | Attending: Radiation Oncology | Admitting: Radiation Oncology

## 2020-12-17 DIAGNOSIS — Z510 Encounter for antineoplastic radiation therapy: Secondary | ICD-10-CM | POA: Diagnosis not present

## 2020-12-17 NOTE — Telephone Encounter (Signed)
Scheduled appointments per 2/17 los. Called patient, no answer. No voicemail available. Mailed updated calendar to patient.

## 2020-12-20 ENCOUNTER — Other Ambulatory Visit: Payer: Self-pay | Admitting: *Deleted

## 2020-12-20 ENCOUNTER — Other Ambulatory Visit (HOSPITAL_COMMUNITY): Payer: Self-pay | Admitting: Hematology and Oncology

## 2020-12-20 ENCOUNTER — Other Ambulatory Visit: Payer: Self-pay

## 2020-12-20 ENCOUNTER — Ambulatory Visit: Payer: Medicaid Other

## 2020-12-20 ENCOUNTER — Ambulatory Visit
Admission: RE | Admit: 2020-12-20 | Discharge: 2020-12-20 | Disposition: A | Discharge status: Home / Self Care | Payer: Medicaid Other | Source: Ambulatory Visit | Attending: Radiation Oncology | Admitting: Radiation Oncology

## 2020-12-20 DIAGNOSIS — Z510 Encounter for antineoplastic radiation therapy: Secondary | ICD-10-CM | POA: Diagnosis not present

## 2020-12-20 MED ORDER — FERROUS SULFATE 325 (65 FE) MG PO TBEC: 325.0000 mg | DELAYED_RELEASE_TABLET | Freq: Two times a day (BID) | ORAL | 2 refills | Status: DC

## 2020-12-20 MED ORDER — B-12 1000 MCG SL SUBL: 1000.0000 ug | SUBLINGUAL_TABLET | Freq: Every day | SUBLINGUAL | 2 refills | Status: DC

## 2020-12-20 MED FILL — FERROUS SULFATE 325 MG TAB: 325 (65 FE) | 30 days supply | Qty: 60 | Fill #0

## 2020-12-21 ENCOUNTER — Other Ambulatory Visit: Payer: Self-pay

## 2020-12-21 ENCOUNTER — Encounter: Payer: Self-pay | Admitting: General Practice

## 2020-12-21 ENCOUNTER — Ambulatory Visit
Admission: RE | Admit: 2020-12-21 | Discharge: 2020-12-21 | Disposition: A | Discharge status: Home / Self Care | Payer: Medicaid Other | Source: Ambulatory Visit | Attending: Radiation Oncology | Admitting: Radiation Oncology

## 2020-12-21 DIAGNOSIS — Z510 Encounter for antineoplastic radiation therapy: Secondary | ICD-10-CM | POA: Diagnosis not present

## 2020-12-21 NOTE — Progress Notes (Signed)
[place] CSW Progress Notes  Request from nurse navigator J Malmfelt to answer patient's questions about his disability application.  Unable to reach patient, no VM.  Called his aunt who has been assisting him.  She reports that he may have questions about the [employer] and/or his disability application. He has been approved for [employer] - advised that he needs to work directly w his financial advocate to access these funds.  Advised that patient needs to contact [place] directly for updates on his disability application as CSW cannot assist him w this process - IT trainer interacts directly with the applicant.  Aunt will inform patient of this.  Edwyna Shell, LCSW Clinical Social Worker Phone:  [phone]

## 2020-12-22 ENCOUNTER — Ambulatory Visit
Admission: RE | Admit: 2020-12-22 | Discharge: 2020-12-22 | Disposition: A | Discharge status: Home / Self Care | Payer: Medicaid Other | Source: Ambulatory Visit | Attending: Radiation Oncology | Admitting: Radiation Oncology

## 2020-12-22 DIAGNOSIS — Z510 Encounter for antineoplastic radiation therapy: Secondary | ICD-10-CM | POA: Diagnosis not present

## 2020-12-23 ENCOUNTER — Telehealth: Payer: Self-pay | Admitting: General Practice

## 2020-12-23 ENCOUNTER — Ambulatory Visit
Admission: RE | Admit: 2020-12-23 | Discharge: 2020-12-23 | Disposition: A | Discharge status: Home / Self Care | Payer: Medicaid Other | Source: Ambulatory Visit | Attending: Radiation Oncology | Admitting: Radiation Oncology

## 2020-12-23 ENCOUNTER — Ambulatory Visit: Payer: Medicaid Other

## 2020-12-23 ENCOUNTER — Other Ambulatory Visit: Payer: Self-pay

## 2020-12-23 DIAGNOSIS — Z510 Encounter for antineoplastic radiation therapy: Secondary | ICD-10-CM | POA: Diagnosis not present

## 2020-12-23 NOTE — Telephone Encounter (Signed)
[place] CSW Progress Notes  Call from aunt, they are trying to get help from [employer] for his unpaid rent.  Landlord needs Estate manager/land agent to email required form - financial advocates notifed and asked to assist.  Edwyna Shell, LCSW Clinical Social Worker Phone:  [phone]

## 2020-12-24 ENCOUNTER — Ambulatory Visit
Admission: RE | Admit: 2020-12-24 | Discharge: 2020-12-24 | Disposition: A | Discharge status: Home / Self Care | Payer: Medicaid Other | Source: Ambulatory Visit | Attending: Radiation Oncology | Admitting: Radiation Oncology

## 2020-12-24 ENCOUNTER — Other Ambulatory Visit: Payer: Self-pay

## 2020-12-24 DIAGNOSIS — Z510 Encounter for antineoplastic radiation therapy: Secondary | ICD-10-CM | POA: Diagnosis not present

## 2020-12-27 ENCOUNTER — Ambulatory Visit
Admission: RE | Admit: 2020-12-27 | Discharge: 2020-12-27 | Disposition: A | Discharge status: Home / Self Care | Payer: Medicaid Other | Source: Ambulatory Visit | Attending: Radiation Oncology | Admitting: Radiation Oncology

## 2020-12-27 ENCOUNTER — Other Ambulatory Visit: Payer: Self-pay

## 2020-12-27 DIAGNOSIS — Z510 Encounter for antineoplastic radiation therapy: Secondary | ICD-10-CM | POA: Diagnosis not present

## 2020-12-28 ENCOUNTER — Other Ambulatory Visit: Payer: Self-pay

## 2020-12-28 ENCOUNTER — Ambulatory Visit
Admission: RE | Admit: 2020-12-28 | Discharge: 2020-12-28 | Disposition: A | Discharge status: Home / Self Care | Payer: Medicaid Other | Source: Ambulatory Visit | Attending: Radiation Oncology | Admitting: Radiation Oncology

## 2020-12-28 DIAGNOSIS — Z1329 Encounter for screening for other suspected endocrine disorder: Secondary | ICD-10-CM | POA: Insufficient documentation

## 2020-12-28 DIAGNOSIS — C021 Malignant neoplasm of border of tongue: Secondary | ICD-10-CM | POA: Insufficient documentation

## 2020-12-28 DIAGNOSIS — Z510 Encounter for antineoplastic radiation therapy: Secondary | ICD-10-CM | POA: Insufficient documentation

## 2020-12-29 ENCOUNTER — Other Ambulatory Visit: Payer: Self-pay

## 2020-12-29 ENCOUNTER — Ambulatory Visit
Admission: RE | Admit: 2020-12-29 | Discharge: 2020-12-29 | Disposition: A | Discharge status: Home / Self Care | Payer: Medicaid Other | Source: Ambulatory Visit | Attending: Radiation Oncology | Admitting: Radiation Oncology

## 2020-12-29 DIAGNOSIS — Z510 Encounter for antineoplastic radiation therapy: Secondary | ICD-10-CM | POA: Diagnosis not present

## 2020-12-30 ENCOUNTER — Other Ambulatory Visit: Payer: Self-pay

## 2020-12-30 ENCOUNTER — Ambulatory Visit
Admission: RE | Admit: 2020-12-30 | Discharge: 2020-12-30 | Disposition: A | Discharge status: Home / Self Care | Payer: Medicaid Other | Source: Ambulatory Visit | Attending: Radiation Oncology | Admitting: Radiation Oncology

## 2020-12-30 ENCOUNTER — Ambulatory Visit: Payer: Medicaid Other | Attending: Radiation Oncology

## 2020-12-30 DIAGNOSIS — R1312 Dysphagia, oropharyngeal phase: Secondary | ICD-10-CM | POA: Diagnosis not present

## 2020-12-30 DIAGNOSIS — Z510 Encounter for antineoplastic radiation therapy: Secondary | ICD-10-CM | POA: Diagnosis not present

## 2020-12-30 NOTE — Therapy (Signed)
[facility] [address] [place], [place], [address] Phone: [phone]   Fax:  [phone]  Speech Language Pathology Treatment  Patient Details  Name: [name] MRN: [id number] Date of Birth: 1976/09/12 Referring Provider (SLP): Eppie Gibson, MD   Encounter Date: 12/30/2020   End of Session - 12/30/20 1549    Visit Number 2    Number of Visits 10    Date for SLP Re-Evaluation 03/16/21    SLP Start Time 1320    SLP Stop Time  1400    SLP Time Calculation (min) 40 min    Activity Tolerance Patient tolerated treatment well           Past Medical History:  Diagnosis Date  . Cancer ([place])    tongue    Past Surgical History:  Procedure Laterality Date  . DIRECT LARYNGOSCOPY N/A 09/05/2020   Procedure: DIRECT LARYNGOSCOPY;  Surgeon: Marcina Millard, MD;  Location: [facility];  Service: ENT;  Laterality: N/A;  . ESOPHAGOSCOPY N/A 09/05/2020   Procedure: ESOPHAGOSCOPY;  Surgeon: Marcina Millard, MD;  Location: [facility];  Service: ENT;  Laterality: N/A;  . FRACTURE SURGERY    . TONGUE BIOPSY N/A 09/05/2020   Procedure: TONGUE BIOPSY;  Surgeon: Marcina Millard, MD;  Location: [facility];  Service: ENT;  Laterality: N/A;    There were no vitals filed for this visit.   Subjective Assessment - 12/30/20 1513    Subjective Pt states he has done ice chips "almost every day three-four times a day." Pt misplaced his HEP from [facility].    Currently in Pain? No/denies                 ADULT SLP TREATMENT - 12/30/20 1513      General Information   Behavior/Cognition Alert;Cooperative;Pleasant mood      Treatment Provided   Treatment provided Dysphagia      Dysphagia Treatment   Temperature Spikes Noted No    Respiratory Status Room air    Oral Cavity - Dentition Missing dentition    Treatment Methods Skilled observation;Compensation strategy training;Therapeutic exercise;Upgraded PO texture  trial;Patient/caregiver education    Patient observed directly with PO's Yes    Type of PO's observed Dysphagia 1 (puree)    Feeding Able to feed self    Oral Phase Signs & Symptoms Prolonged bolus formation;Right pocketing    Pharyngeal Phase Signs & Symptoms Multiple swallows;Audible swallow;Wet vocal quality;Immediate throat clear;Delayed cough;Complaints of residue    Other treatment/comments Near end of session pt told SLP he has been trying applesauce POs. With applesauce, pt exhibited "wet" voice consistently with 1/3 teaspoons 2/2 teaspoons. SLP told pt NOT to attempt applesauce any more and just do ice chips after meticulous oral care. SLP reviewed pt's precautions with him with ice chips - swallow hard x4-5 for each bolus, hock, swallow, suction if necessary. Pt req'd min A with effortful swallow - which was his only exercise from [facility]. This SLP told Tysin to complete 10 reps of this exercise 3-5 times/day. Pt performed this exercise with model first, and then wiht independence after 3 reps.      Assessment / Recommendations / Plan   Plan Continue with current plan of care            SLP Education - 12/30/20 1548    Education Details oral care prior ot ice chips, multiple swallows - hock-reswallow-suction PRN for each bolus, no more applesauce    Person(s)  Educated Patient    Methods Explanation;Demonstration;Verbal cues    Comprehension Verbalized understanding;Returned demonstration;Verbal cues required;Need further instruction            SLP Short Term Goals - 12/30/20 1551      SLP SHORT TERM GOAL #1   Title Pt will complete HEP with rare min A over two sessions    Baseline total A; 12-30-20    Time 3    Period --   sessions, for all STGs   Status On-going      SLP SHORT TERM GOAL #2   Title Pt will tell SLP 3 overt s/s aspiration PNA with modified independence    Baseline none given    Time 1    Status On-going      SLP SHORT TERM GOAL #3   Title Pt will  tell SLP rationale for HEP completion    Baseline total A    Time 1    Status On-going            SLP Long Term Goals - 12/30/20 1552      SLP LONG TERM GOAL #1   Title Pt will complete HEP with modified independence over 2 visits    Baseline total A    Time 3    Period --   sessions, for all LTGs   Status On-going      SLP LONG TERM GOAL #2   Title pt will perform safety precautions from FEES with PO ice chips with modified independence 2 sessions    Baseline total A    Time 3    Status On-going      SLP LONG TERM GOAL #3   Title Pt will tell SLP when to decr frequency of HEP    Baseline not provided yet    Time 4    Status On-going            Plan - 12/30/20 1549    Clinical Impression Statement Rion presents today cont PEG tube dependent following significant oral and phayrngeal surgery in December requiring him to be NPO currently. Pt has effortful swallow exercise from [facility], per FEES report. No overt s/sx aspiration PNA reported or observed today. Data suggest that pt's swallowing is threatened by muscle fibrosis that could develop after rad/chemorad is completed. Muscle disuse atrophy is also possible as pt has been PEG dependent many weeks. Therefore, SLP highly stressed pt complete the indivdualized exercise program he rec'd at [facility] to mitigate/eliminate pt's muscle fibrosis following and as a result of, pt's rad tx. Pt will perform swallowing with ice chips x3-5/day, for 10-15 minutes or until muscles feel tired. SLP highly stressed pt's need to perform oral care < 30 minutes prior to initiating PO ice chips. Skilled ST would be beneficial to the pt in order to regularly assess pt's safety with POs and/or need for instrumental swallow assessment, as well as to assess accurate completion of swallowing HEP.    Speech Therapy Frequency --   approx x2/month   Duration --   10 visits   Treatment/Interventions Aspiration precaution training;Pharyngeal strengthening  exercises;Diet toleration management by SLP;Trials of upgraded texture/liquids;Patient/family education;Internal/external aids;SLP instruction and feedback;Compensatory strategies;Other (comment)    Potential to Achieve Goals Fair    Potential Considerations Severity of impairments;Previous level of function    SLP Home Exercise Plan pt rec'd at [facility] after FEES 11-29-20    Consulted and Agree with Plan of Care Patient  Patient will benefit from skilled therapeutic intervention in order to improve the following deficits and impairments:   Dysphagia, oropharyngeal phase    Problem List Patient Active Problem List   Diagnosis Date Noted  . Cancer of anterior two-thirds of tongue ([place]) 11/23/2020  . HTN (hypertension) 10/15/2020  . Influenza vaccine refused 09/21/2020  . Hypertension 09/21/2020  . Daily consumption of alcohol 09/13/2020  . 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 Tongue carcinoma ([place])     [facility] ,[place], [place]  12/30/2020, 3:52 PM  [facility] [address] [place], [place], [address] Phone: [phone]   Fax:  [phone]   Name: MAKENZIE VITTORIO MRN: [id number] Date of Birth: 1976/10/05

## 2020-12-31 ENCOUNTER — Ambulatory Visit
Admission: RE | Admit: 2020-12-31 | Discharge: 2020-12-31 | Disposition: A | Discharge status: Home / Self Care | Payer: Medicaid Other | Source: Ambulatory Visit | Attending: Radiation Oncology | Admitting: Radiation Oncology

## 2020-12-31 ENCOUNTER — Other Ambulatory Visit: Payer: Self-pay

## 2020-12-31 DIAGNOSIS — Z510 Encounter for antineoplastic radiation therapy: Secondary | ICD-10-CM | POA: Diagnosis not present

## 2021-01-03 ENCOUNTER — Other Ambulatory Visit: Payer: Self-pay

## 2021-01-03 ENCOUNTER — Ambulatory Visit
Admission: RE | Admit: 2021-01-03 | Discharge: 2021-01-03 | Disposition: A | Discharge status: Home / Self Care | Payer: Medicaid Other | Source: Ambulatory Visit | Attending: Radiation Oncology | Admitting: Radiation Oncology

## 2021-01-03 ENCOUNTER — Other Ambulatory Visit: Payer: Self-pay | Admitting: Radiation Oncology

## 2021-01-03 DIAGNOSIS — Z510 Encounter for antineoplastic radiation therapy: Secondary | ICD-10-CM | POA: Diagnosis not present

## 2021-01-03 DIAGNOSIS — C029 Malignant neoplasm of tongue, unspecified: Secondary | ICD-10-CM

## 2021-01-03 MED ORDER — IBUPROFEN 800 MG PO TABS: 800.0000 mg | ORAL_TABLET | Freq: Three times a day (TID) | ORAL | 0 refills | Status: DC | PRN

## 2021-01-04 ENCOUNTER — Ambulatory Visit
Admission: RE | Admit: 2021-01-04 | Discharge: 2021-01-04 | Disposition: A | Discharge status: Home / Self Care | Payer: Medicaid Other | Source: Ambulatory Visit | Attending: Radiation Oncology | Admitting: Radiation Oncology

## 2021-01-04 ENCOUNTER — Other Ambulatory Visit: Payer: Self-pay

## 2021-01-04 DIAGNOSIS — Z510 Encounter for antineoplastic radiation therapy: Secondary | ICD-10-CM | POA: Diagnosis not present

## 2021-01-05 ENCOUNTER — Other Ambulatory Visit: Payer: Self-pay

## 2021-01-05 ENCOUNTER — Ambulatory Visit
Admission: RE | Admit: 2021-01-05 | Discharge: 2021-01-05 | Disposition: A | Discharge status: Home / Self Care | Payer: Medicaid Other | Source: Ambulatory Visit | Attending: Radiation Oncology | Admitting: Radiation Oncology

## 2021-01-05 DIAGNOSIS — Z510 Encounter for antineoplastic radiation therapy: Secondary | ICD-10-CM | POA: Diagnosis not present

## 2021-01-06 ENCOUNTER — Ambulatory Visit
Admission: RE | Admit: 2021-01-06 | Discharge: 2021-01-06 | Disposition: A | Discharge status: Home / Self Care | Payer: Medicaid Other | Source: Ambulatory Visit | Attending: Radiation Oncology | Admitting: Radiation Oncology

## 2021-01-06 ENCOUNTER — Other Ambulatory Visit: Payer: Self-pay

## 2021-01-06 DIAGNOSIS — Z510 Encounter for antineoplastic radiation therapy: Secondary | ICD-10-CM | POA: Diagnosis not present

## 2021-01-07 ENCOUNTER — Ambulatory Visit
Admission: RE | Admit: 2021-01-07 | Discharge: 2021-01-07 | Disposition: A | Discharge status: Home / Self Care | Payer: Medicaid Other | Source: Ambulatory Visit | Attending: Radiation Oncology | Admitting: Radiation Oncology

## 2021-01-07 DIAGNOSIS — Z510 Encounter for antineoplastic radiation therapy: Secondary | ICD-10-CM | POA: Diagnosis not present

## 2021-01-10 ENCOUNTER — Other Ambulatory Visit: Payer: Self-pay

## 2021-01-10 ENCOUNTER — Ambulatory Visit
Admission: RE | Admit: 2021-01-10 | Discharge: 2021-01-10 | Disposition: A | Discharge status: Home / Self Care | Payer: Medicaid Other | Source: Ambulatory Visit | Attending: Radiation Oncology | Admitting: Radiation Oncology

## 2021-01-10 DIAGNOSIS — Z510 Encounter for antineoplastic radiation therapy: Secondary | ICD-10-CM | POA: Diagnosis not present

## 2021-01-11 ENCOUNTER — Ambulatory Visit
Admission: RE | Admit: 2021-01-11 | Discharge: 2021-01-11 | Disposition: A | Discharge status: Home / Self Care | Payer: Medicaid Other | Source: Ambulatory Visit | Attending: Radiation Oncology | Admitting: Radiation Oncology

## 2021-01-11 DIAGNOSIS — Z510 Encounter for antineoplastic radiation therapy: Secondary | ICD-10-CM | POA: Diagnosis not present

## 2021-01-12 ENCOUNTER — Ambulatory Visit
Admission: RE | Admit: 2021-01-12 | Discharge: 2021-01-12 | Disposition: A | Discharge status: Home / Self Care | Payer: Medicaid Other | Source: Ambulatory Visit | Attending: Radiation Oncology | Admitting: Radiation Oncology

## 2021-01-12 ENCOUNTER — Other Ambulatory Visit: Payer: Self-pay

## 2021-01-12 ENCOUNTER — Inpatient Hospital Stay (HOSPITAL_BASED_OUTPATIENT_CLINIC_OR_DEPARTMENT_OTHER): Payer: Medicaid Other | Admitting: Hematology and Oncology

## 2021-01-12 ENCOUNTER — Encounter: Payer: Self-pay | Admitting: Hematology and Oncology

## 2021-01-12 ENCOUNTER — Inpatient Hospital Stay: Payer: Medicaid Other | Attending: Hematology and Oncology

## 2021-01-12 ENCOUNTER — Telehealth: Payer: Self-pay

## 2021-01-12 VITALS — BP 129/80 | HR 106 | Temp 97.0°F | Resp 18 | Ht 69.0 in | Wt 140.0 lb

## 2021-01-12 DIAGNOSIS — Z510 Encounter for antineoplastic radiation therapy: Secondary | ICD-10-CM | POA: Diagnosis not present

## 2021-01-12 DIAGNOSIS — C023 Malignant neoplasm of anterior two-thirds of tongue, part unspecified: Secondary | ICD-10-CM | POA: Diagnosis not present

## 2021-01-12 DIAGNOSIS — Z87891 Personal history of nicotine dependence: Secondary | ICD-10-CM | POA: Diagnosis not present

## 2021-01-12 DIAGNOSIS — Z823 Family history of stroke: Secondary | ICD-10-CM | POA: Insufficient documentation

## 2021-01-12 DIAGNOSIS — D649 Anemia, unspecified: Secondary | ICD-10-CM | POA: Diagnosis not present

## 2021-01-12 DIAGNOSIS — Z801 Family history of malignant neoplasm of trachea, bronchus and lung: Secondary | ICD-10-CM | POA: Diagnosis not present

## 2021-01-12 DIAGNOSIS — C01 Malignant neoplasm of base of tongue: Secondary | ICD-10-CM | POA: Diagnosis present

## 2021-01-12 DIAGNOSIS — Z79899 Other long term (current) drug therapy: Secondary | ICD-10-CM | POA: Diagnosis not present

## 2021-01-12 DIAGNOSIS — C029 Malignant neoplasm of tongue, unspecified: Secondary | ICD-10-CM

## 2021-01-12 DIAGNOSIS — Z1329 Encounter for screening for other suspected endocrine disorder: Secondary | ICD-10-CM

## 2021-01-12 LAB — BASIC METABOLIC PANEL - CANCER CENTER ONLY
Anion gap: 5 (ref 5–15)
BUN: 13 mg/dL (ref 6–20)
CO2: 29 mmol/L (ref 22–32)
Calcium: 10 mg/dL (ref 8.9–10.3)
Chloride: 101 mmol/L (ref 98–111)
Creatinine: 0.68 mg/dL (ref 0.61–1.24)
GFR, Estimated: >60 mL/min (ref >60)
Glucose, Bld: 122 mg/dL — ABNORMAL HIGH (ref 70–99)
Potassium: 4 mmol/L (ref 3.5–5.1)
Sodium: 135 mmol/L (ref 135–145)

## 2021-01-12 LAB — CBC WITH DIFFERENTIAL/PLATELET
Abs Immature Granulocytes: 0.01 10*3/uL (ref 0.00–0.07)
Basophils Absolute: 0 10*3/uL (ref 0.0–0.1)
Basophils Relative: 0 %
Eosinophils Absolute: 0.1 10*3/uL (ref 0.0–0.5)
Eosinophils Relative: 1 %
HCT: 32.2 % — ABNORMAL LOW (ref 39.0–52.0)
Hemoglobin: 10 g/dL — ABNORMAL LOW (ref 13.0–17.0)
Immature Granulocytes: 0 %
Lymphocytes Relative: 11 %
Lymphs Abs: 0.5 10*3/uL — ABNORMAL LOW (ref 0.7–4.0)
MCH: 24.9 pg — ABNORMAL LOW (ref 26.0–34.0)
MCHC: 31.1 g/dL (ref 30.0–36.0)
MCV: 80.3 fL (ref 80.0–100.0)
Monocytes Absolute: 0.4 10*3/uL (ref 0.1–1.0)
Monocytes Relative: 9 %
Neutro Abs: 3.8 10*3/uL (ref 1.7–7.7)
Neutrophils Relative %: 79 %
Platelets: 286 10*3/uL (ref 150–400)
RBC: 4.01 MIL/uL — ABNORMAL LOW (ref 4.22–5.81)
RDW: 19.4 % — ABNORMAL HIGH (ref 11.5–15.5)
WBC: 4.8 10*3/uL (ref 4.0–10.5)
nRBC: 0 % (ref 0.0–0.2)

## 2021-01-12 LAB — VITAMIN B12: Vitamin B-12: 387 pg/mL (ref 180–914)

## 2021-01-12 LAB — IRON AND TIBC
Iron: 100 ug/dL (ref 42–163)
Saturation Ratios: 23 % (ref 20–55)
TIBC: 433 ug/dL — ABNORMAL HIGH (ref 202–409)
UIBC: 333 ug/dL (ref 117–376)

## 2021-01-12 LAB — FERRITIN: Ferritin: 23 ng/mL — ABNORMAL LOW (ref 24–336)

## 2021-01-12 LAB — MAGNESIUM: Magnesium: 1.7 mg/dL (ref 1.7–2.4)

## 2021-01-12 LAB — PHOSPHORUS: Phosphorus: 3.7 mg/dL (ref 2.5–4.6)

## 2021-01-12 NOTE — Telephone Encounter (Signed)
Called patient and made him aware that per Dr. Chryl Heck he should continue to take iron as discussed until he returns for follow-up. Patient verbalized understanding.

## 2021-01-12 NOTE — Progress Notes (Signed)
[facility] CONSULT NOTE  Patient Care Team: Pcp, No as PCP - General  CHIEF COMPLAINTS/PURPOSE OF CONSULTATION:  Squamous cell carcinoma of oral cavity  ASSESSMENT & PLAN:  No problem-specific Assessment & Plan notes found for this encounter.  No orders of the defined types were placed in this encounter.  1. [name] is a 45 y.o. 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 He is s/p total glossectomy via mandibular split approach, partial pharyngectomy, bilateral neck dissection, ALT free flap reconstruction on 10/15/20. Final pathology showed 5.4 cms tumor with DOI of 2.6 cms. No evidence of LVI or perineural invasion, margins free of tumor, closest approach is 0.3 cms from posterior soft tissue margin. 54 LN negative.  We recommended adjuvant radiation alone. He continues with Dr Isidore Moos for adjuvant radiation and FU. He is tolerating radiation very well, anticipated end date is 01/19/2021  2. Normocytic normochromic anemia Improving with iron and B12 supplementation. He feels well, no fatigue or SOB. Encouraged him to continue iron and B12 for another month or two at this time. We will call with additional recommendations based on labs from today.  HISTORY OF PRESENTING ILLNESS:   [name] 45 y.o. male is here because of new diagnosis of left tongue cancer.   He arrived with his family member today to the appointment.  He tells me that he first noticed some pain in the left tongue but attributed this to back to scraping his tongue.  He went in to see his dentist and was found to have this left tongue mass which was biopsied back in November and demonstrated invasive squamous cell carcinoma.  He then underwent MRI imaging and surgery at [facility], had total glossectomy, partial pharyngectomy, bilateral neck dissection and is following up  with medical oncology and radiation oncology for adjuvant recommendations.  Biopsies revealed:  09/05/2020 FINAL MICROSCOPIC DIAGNOSIS:  A. TONGUE, LEFT BASE, BIOPSY:  - Invasive squamous cell carcinoma.  COMMENT:  There is no perineural or lymphovascular invasion. The carcinoma appears well differentiated ADDENDUM:  P16 is only focally and weakly positive in the invasive tumor (<5%).  10/12/2020 IMPRESSION: Large mass within the tongue apparently restricted to the left side, without evidence of crossing of the tongue midline. Lesion in total measures about 7 cm from front to back. Lesion extends all the way from the superior surface of the anterior tongue back to the posterior tongue and base of the left. Tumor bulges into the sublingual region on the left. This appears very similar to the previous CT, as best as the 2 techniques can be compared. Invasion of the left submandibular gland is not demonstrated. There is edematous change of the adjacent soft tissues, questionably secondary to regional radiation. Mild edema and enhancement of level 2 nodes on the left likely related to radiation. No evidence of  cervical lymph node enlargement or necrosis.  12/17. Had total glossectomy via mandibular split approach, partial pharyngectomy, bilateral neck dissection, ALT free flap reconstruction on 10/15/20. Final pathology showed 5.4 cms tumor with DOI of 2.6 cms. No evidence of LVI or perineural invasion, margins free of tumor, closest approach is 0.3 cms from posterior soft tissue margin. 54 LN negative. He is now undergoing radiation  Interval History  He is here for FU on his anemia. All is going well, no major complications from radiation. No fatigue, SOB. No  change in breathing, bowel habits or urinary habits. No new neurological complaints. He is compliant with iron and B12 supplementation. Rest of the pertinent 10 point ROS reviewed and negative.  MEDICAL HISTORY:  Past Medical  History:  Diagnosis Date  . Cancer ([place])    tongue    SURGICAL HISTORY: Past Surgical History:  Procedure Laterality Date  . DIRECT LARYNGOSCOPY N/A 09/05/2020   Procedure: DIRECT LARYNGOSCOPY;  Surgeon: Alex Millard, MD;  Location: [facility];  Service: ENT;  Laterality: N/A;  . ESOPHAGOSCOPY N/A 09/05/2020   Procedure: ESOPHAGOSCOPY;  Surgeon: Alex Millard, MD;  Location: [facility];  Service: ENT;  Laterality: N/A;  . FRACTURE SURGERY    . TONGUE BIOPSY N/A 09/05/2020   Procedure: TONGUE BIOPSY;  Surgeon: Alex Millard, MD;  Location: [facility] OR;  Service: ENT;  Laterality: N/A;    SOCIAL HISTORY: Social History   Socioeconomic History  . Marital status: Single    Spouse name: Not on file  . Number of children: Not on file  . Years of education: Not on file  . Highest education level: Not on file  Occupational History  . Not on file  Tobacco Use  . Smoking status: Former Smoker    Packs/day: 1PPD for 25 yrs    Types: Cigarettes  . Smokeless tobacco: Never Used  Vaping Use  . Vaping Use: Never used  Substance and Sexual Activity  . Alcohol use: Not Currently  . Drug use: Yes    Types: Marijuana  . Sexual activity: Not Currently  Other Topics Concern  . Not on file  Social History Narrative  . Not on file   Social Determinants of Health   Financial Resource Strain: Not on file  Food Insecurity: Not on file  Transportation Needs: Not on file  Physical Activity: Not on file  Stress: Not on file  Social Connections: Not on file  Intimate Partner Violence: Not on file    FAMILY HISTORY: Family History  Problem Relation Age of Onset  . Lung cancer Mother   . Stroke Father   . Lung cancer Sister   . Lung cancer Maternal Grandmother     ALLERGIES:  has No Known Allergies.  MEDICATIONS:  Current Outpatient Medications  Medication Sig Dispense Refill  . chlorthalidone (HYGROTON) 25 MG tablet Take 1 tablet (25 mg total) by mouth daily. (Patient not  taking: No sig reported) 90 tablet 3  . Cyanocobalamin (B-12) 1000 MCG SUBL Place 1,000 mcg under the tongue daily. 30 tablet 2  . ferrous sulfate 325 (65 FE) MG EC tablet Take 1 tablet (325 mg total) by mouth 2 (two) times daily with a meal. 60 tablet 2  . ibuprofen (ADVIL) 800 MG tablet Take 1 tablet (800 mg total) by mouth every 8 (eight) hours as needed. Take with food. 60 tablet 0  . lidocaine (XYLOCAINE) 2 % solution Patient: Mix 1part 2% viscous lidocaine, 1part H20. Swish & swallow 73mL of diluted mixture, 46min before meals and at bedtime, up to QID (Patient not taking: Reported on 12/16/2020) 200 mL 3  . Nutritional Supplements (FEEDING SUPPLEMENT, OSMOLITE 1.5 CAL,) LIQD 1-1/2 cartons of Osmolite 1.5, 4 times daily through [facility] low-profile gastrostomy tube with 60 mL free water flush before and after each bolus feeding.  In addition flush 240 mL free water 3 times daily between feedings.  Provides 2130 cal, 89.4 g protein, 2286 mL free water/100% needs.  Please contact patient's aunt, Irwin Brakeman at [phone] for delivery information.  Send formula and supplies. (Patient not taking: Reported on 12/16/2020) 1422 mL 0  . oxyCODONE (OXY IR/ROXICODONE) 5 MG immediate release tablet Take 5 mg by mouth 2 (two) times daily as needed. (Patient not taking: No sig reported)     No current facility-administered medications for this visit.     PHYSICAL EXAMINATION: ECOG PERFORMANCE STATUS: 0 - Asymptomatic  Vitals:   01/12/21 1104  BP: 129/80  Pulse: (!) 106  Resp: 18  Temp: (!) 97 F (36.1 C)  SpO2: 100%   Filed Weights   01/12/21 1104  Weight: 140 lb (63.5 kg)   Physical Exam Constitutional:      Appearance: Normal appearance. He is normal weight.  HENT:     Head: Normocephalic and atraumatic.  Eyes:     Extraocular Movements: Extraocular movements intact.  Cardiovascular:     Rate and Rhythm: Normal rate and regular rhythm.     Pulses: Normal pulses.  Pulmonary:     Effort:  Pulmonary effort is normal.     Breath sounds: Normal breath sounds.  Abdominal:     General: Abdomen is flat. Bowel sounds are normal.  Musculoskeletal:        General: No swelling. Normal range of motion.     Cervical back: Normal range of motion and neck supple. No rigidity.  Lymphadenopathy:     Cervical: No cervical adenopathy.  Skin:    General: Skin is warm and dry.  Neurological:     General: No focal deficit present.     Mental Status: He is alert.  Psychiatric:        Mood and Affect: Mood normal.        Behavior: Behavior normal.     LABORATORY DATA:  I have reviewed the data as listed Lab Results  Component Value Date   WBC 4.8 01/12/2021   HGB 10.0 (L) 01/12/2021   HCT 32.2 (L) 01/12/2021   MCV 80.3 01/12/2021   PLT 286 01/12/2021     Chemistry      Component Value Date/Time   NA 135 01/12/2021 1057   K 4.0 01/12/2021 1057   CL 101 01/12/2021 1057   CO2 29 01/12/2021 1057   BUN 13 01/12/2021 1057   CREATININE 0.68 01/12/2021 1057      Component Value Date/Time   CALCIUM 10.0 01/12/2021 1057   ALKPHOS 73 11/09/2018 1126   AST 57 (H) 11/09/2018 1126   ALT 34 11/09/2018 1126   BILITOT 1.9 (H) 11/09/2018 1126     I have reviewed pertinent labs. HB improved from 8.4 -10.  RADIOGRAPHIC STUDIES: I have personally reviewed the radiological images as listed and agreed with the findings in the report. No results found.  All questions were answered. The patient knows to call the clinic with any problems, questions or concerns.      Benay Pike, MD 01/12/2021 11:40 AM

## 2021-01-12 NOTE — Telephone Encounter (Signed)
-----   Message from Benay Pike, MD sent at 01/12/2021  2:29 PM EDT ----- He should continue to take iron as we discussed until he returns for follow up. Please let him know. Thanks

## 2021-01-13 ENCOUNTER — Ambulatory Visit
Admission: RE | Admit: 2021-01-13 | Discharge: 2021-01-13 | Disposition: A | Discharge status: Home / Self Care | Payer: Medicaid Other | Source: Ambulatory Visit | Attending: Radiation Oncology | Admitting: Radiation Oncology

## 2021-01-13 ENCOUNTER — Telehealth: Payer: Self-pay | Admitting: Hematology and Oncology

## 2021-01-13 DIAGNOSIS — Z510 Encounter for antineoplastic radiation therapy: Secondary | ICD-10-CM | POA: Diagnosis not present

## 2021-01-13 LAB — KAPPA/LAMBDA LIGHT CHAINS
Kappa free light chain: 41.4 mg/L — ABNORMAL HIGH (ref 3.3–19.4)
Kappa, lambda light chain ratio: 1.4 (ref 0.26–1.65)
Lambda free light chains: 29.5 mg/L — ABNORMAL HIGH (ref 5.7–26.3)

## 2021-01-13 NOTE — Telephone Encounter (Signed)
Scheduled appt per 3/16 los. Pt aware.

## 2021-01-14 ENCOUNTER — Ambulatory Visit
Admission: RE | Admit: 2021-01-14 | Discharge: 2021-01-14 | Disposition: A | Discharge status: Home / Self Care | Payer: Medicaid Other | Source: Ambulatory Visit | Attending: Radiation Oncology | Admitting: Radiation Oncology

## 2021-01-14 DIAGNOSIS — Z510 Encounter for antineoplastic radiation therapy: Secondary | ICD-10-CM | POA: Diagnosis not present

## 2021-01-17 ENCOUNTER — Ambulatory Visit
Admission: RE | Admit: 2021-01-17 | Discharge: 2021-01-17 | Disposition: A | Discharge status: Home / Self Care | Payer: Medicaid Other | Source: Ambulatory Visit | Attending: Radiation Oncology | Admitting: Radiation Oncology

## 2021-01-17 ENCOUNTER — Other Ambulatory Visit: Payer: Self-pay

## 2021-01-17 DIAGNOSIS — Z510 Encounter for antineoplastic radiation therapy: Secondary | ICD-10-CM | POA: Diagnosis not present

## 2021-01-17 LAB — PROTEIN ELECTROPHORESIS, SERUM, WITH REFLEX
A/G Ratio: 0.8 (ref 0.7–1.7)
Albumin ELP: 3.5 g/dL (ref 2.9–4.4)
Alpha-1-Globulin: 0.3 g/dL (ref 0.0–0.4)
Alpha-2-Globulin: 0.9 g/dL (ref 0.4–1.0)
Beta Globulin: 1.1 g/dL (ref 0.7–1.3)
Gamma Globulin: 1.9 g/dL — ABNORMAL HIGH (ref 0.4–1.8)
Globulin, Total: 4.2 g/dL — ABNORMAL HIGH (ref 2.2–3.9)
Total Protein ELP: 7.7 g/dL (ref 6.0–8.5)

## 2021-01-18 ENCOUNTER — Ambulatory Visit
Admission: RE | Admit: 2021-01-18 | Discharge: 2021-01-18 | Disposition: A | Discharge status: Home / Self Care | Payer: Medicaid Other | Source: Ambulatory Visit | Attending: Radiation Oncology | Admitting: Radiation Oncology

## 2021-01-18 DIAGNOSIS — Z510 Encounter for antineoplastic radiation therapy: Secondary | ICD-10-CM | POA: Diagnosis not present

## 2021-01-19 ENCOUNTER — Ambulatory Visit
Admission: RE | Admit: 2021-01-19 | Discharge: 2021-01-19 | Disposition: A | Discharge status: Home / Self Care | Payer: Medicaid Other | Source: Ambulatory Visit | Attending: Radiation Oncology | Admitting: Radiation Oncology

## 2021-01-19 ENCOUNTER — Other Ambulatory Visit: Payer: Self-pay

## 2021-01-19 ENCOUNTER — Encounter: Payer: Self-pay | Admitting: Radiation Oncology

## 2021-01-19 DIAGNOSIS — Z510 Encounter for antineoplastic radiation therapy: Secondary | ICD-10-CM | POA: Diagnosis not present

## 2021-01-19 NOTE — Progress Notes (Signed)
Oncology Nurse Navigator Documentation  Met with [name] before his final RT to offer support and to celebrate end of radiation treatment.   Provided verbal/written post-RT guidance:  Importance of keeping all follow-up appts, especially those with Nutrition and SLP.  Importance of protecting treatment area from sun.  Continuation of Sonafine application 2-3 times daily, application of antibiotic ointment to areas of raw skin; when supply of Sonafine exhausted transition to OTC lotion with vitamin E. Provided/reviewed Epic calendar of upcoming appts. Explained my role as navigator will continue for several more months, encouraged him to call me with needs/concerns.    Harlow Asa RN, BSN, OCN Head & Neck Oncology Nurse [place] at [facility] Phone # [phone]  Fax # [phone]

## 2021-02-02 ENCOUNTER — Ambulatory Visit: Payer: Medicaid Other | Attending: Radiation Oncology | Admitting: Physical Therapy

## 2021-02-02 ENCOUNTER — Other Ambulatory Visit: Payer: Self-pay

## 2021-02-02 ENCOUNTER — Ambulatory Visit
Admission: RE | Admit: 2021-02-02 | Discharge: 2021-02-02 | Disposition: A | Discharge status: Home / Self Care | Payer: Medicaid Other | Source: Ambulatory Visit | Attending: Radiation Oncology | Admitting: Radiation Oncology

## 2021-02-02 VITALS — BP 124/86 | HR 103 | Temp 97.6°F | Resp 20 | Ht 69.0 in | Wt 144.2 lb

## 2021-02-02 DIAGNOSIS — Z923 Personal history of irradiation: Secondary | ICD-10-CM | POA: Diagnosis not present

## 2021-02-02 DIAGNOSIS — C029 Malignant neoplasm of tongue, unspecified: Secondary | ICD-10-CM

## 2021-02-02 DIAGNOSIS — C023 Malignant neoplasm of anterior two-thirds of tongue, part unspecified: Secondary | ICD-10-CM | POA: Insufficient documentation

## 2021-02-02 DIAGNOSIS — R1312 Dysphagia, oropharyngeal phase: Secondary | ICD-10-CM | POA: Insufficient documentation

## 2021-02-02 DIAGNOSIS — R471 Dysarthria and anarthria: Secondary | ICD-10-CM | POA: Insufficient documentation

## 2021-02-02 NOTE — Progress Notes (Addendum)
Mr. Pevehouse presents today for 2 week follow-up of radiation to his tongue completed on 01/19/2021  Pain issues, if any: Reports occasional discomfort to the side of his jaw/under his neck (1-2 out of 10), but states he is able to manage with ibuprofen (or nothing at all) Using a feeding tube?: Yes--he has increased to 8-9 cartons a day Weight changes, if any:  Wt Readings from Last 3 Encounters:  02/02/21 144 lb 3.2 oz (65.4 kg)  01/12/21 140 lb (63.5 kg)  12/16/20 130 lb 9.6 oz (59.2 kg)   Swallowing issues, if any: Patient reports it's improving. States he is able to swallow most liquids and is hoping to try soft foods soon. Smoking or chewing tobacco? None Using fluoride trays daily? N/A Last ENT visit was on: Not since diagnosis Other notable issues, if any: Reports thick/copious have decreased some. Denies any ear or jaw pain, or difficulty opening his mouth fully. Reports swelling under jaw has decreased. Denies any trouble sleeping and reports fatigue is improving. Skin is starting to heal and return to baseline coloring. Over all appears to be doing well and is pleased with his progress thus far  Vitals:   02/02/21 1532  BP: 124/86  Pulse: (!) 103  Resp: 20  Temp: 97.6 F (36.4 C)  SpO2: 100%

## 2021-02-03 ENCOUNTER — Ambulatory Visit: Payer: Medicaid Other

## 2021-02-03 ENCOUNTER — Other Ambulatory Visit: Payer: Self-pay

## 2021-02-03 ENCOUNTER — Inpatient Hospital Stay: Payer: Medicaid Other | Attending: Hematology and Oncology | Admitting: Nutrition

## 2021-02-03 ENCOUNTER — Encounter: Payer: Self-pay | Admitting: Radiation Oncology

## 2021-02-03 DIAGNOSIS — R471 Dysarthria and anarthria: Secondary | ICD-10-CM

## 2021-02-03 DIAGNOSIS — R1312 Dysphagia, oropharyngeal phase: Secondary | ICD-10-CM

## 2021-02-03 MED ORDER — OSMOLITE 1.5 CAL PO LIQD: ORAL | 0 refills | Status: AC

## 2021-02-03 NOTE — Patient Instructions (Signed)
Signs of Aspiration Pneumonia   . Chest pain/tightness . Fever (can be low grade) . Cough  o With foul-smelling phlegm (sputum) o With sputum containing pus or blood o With greenish sputum . Fatigue  . Shortness of breath  . Wheezing   **IF YOU HAVE THESE SIGNS, CONTACT YOUR DOCTOR OR GO TO THE EMERGENCY DEPARTMENT OR URGENT CARE AS SOON AS POSSIBLE**      

## 2021-02-03 NOTE — Progress Notes (Signed)
Radiation Oncology         [phone] ________________________________  Name: [name] MRN: [id number]  Date: 02/02/2021  DOB: 1976-10-05  Follow-Up Visit Note  Outpatient  CC: Pcp, No  Fredricka Bonine, *  Diagnosis and Prior Radiotherapy:    ICD-10-CM   1. Squamous cell cancer of tongue ([facility])  C02.9 Ambulatory referral to Physical Therapy    CT Chest W Contrast    CT Soft Tissue Neck W Contrast  2. Cancer of anterior two-thirds of tongue ([place])  C02.3     CHIEF COMPLAINT: Here for follow-up and surveillance of tongue cancer  Narrative:  The patient returns today for routine follow-up.   Mr. Hillhouse presents today for 2 week follow-up of radiation to his tongue completed on 01/19/2021  Pain issues, if any: Reports occasional discomfort to the side of his jaw/under his neck (1-2 out of 10), but states he is able to manage with ibuprofen (or nothing at all) Using a feeding tube?: Yes--he has increased to 8-9 cartons a day Weight changes, if any:  Wt Readings from Last 3 Encounters:  02/02/21 144 lb 3.2 oz (65.4 kg)  01/12/21 140 lb (63.5 kg)  12/16/20 130 lb 9.6 oz (59.2 kg)   Swallowing issues, if any: Patient reports it's improving. States he is able to swallow most liquids and is hoping to try soft foods soon. Smoking or chewing tobacco? None Using fluoride trays daily? N/A Last ENT visit was on: Not since diagnosis Other notable issues, if any: Reports thick/copious have decreased some. Denies any ear or jaw pain, or difficulty opening his mouth fully. Reports swelling under jaw has decreased. Denies any trouble sleeping and reports fatigue is improving. Skin is starting to heal and return to baseline coloring. Over all appears to be doing well and is pleased with his progress thus far  Vitals:   02/02/21 1532  BP: 124/86  Pulse: (!) 103  Resp: 20  Temp: 97.6 F (36.4 C)  SpO2: 100%                                ALLERGIES:  has no allergies on  file.  Meds: Current Outpatient Medications  Medication Sig Dispense Refill  . ferrous sulfate 325 (65 FE) MG EC tablet Take 1 tablet (325 mg total) by mouth 2 (two) times daily with a meal. 60 tablet 2  . ibuprofen (ADVIL) 800 MG tablet Take 1 tablet (800 mg total) by mouth every 8 (eight) hours as needed. Take with food. 60 tablet 0  . chlorthalidone (HYGROTON) 25 MG tablet Take 1 tablet (25 mg total) by mouth daily. (Patient not taking: No sig reported) 90 tablet 3  . Cyanocobalamin (B-12) 1000 MCG SUBL Place 1,000 mcg under the tongue daily. (Patient not taking: Reported on 02/02/2021) 30 tablet 2  . ferrous sulfate 325 (65 FE) MG tablet TAKE 1 TABLET BY MOUTH 2 TIMES DAILY WITH A MEAL (Patient not taking: Reported on 02/02/2021) 60 tablet 2  . lidocaine (XYLOCAINE) 2 % solution Patient: Mix 1part 2% viscous lidocaine, 1part H20. Swish & swallow 77mL of diluted mixture, 67min before meals and at bedtime, up to QID (Patient not taking: No sig reported) 200 mL 3  . Nutritional Supplements (FEEDING SUPPLEMENT, OSMOLITE 1.5 CAL,) LIQD Increase Osmolite 1.5 to 8 cartons daily with 60 mL free water before and after each bolus feeding. Feed via low profile gastrostomy tube. Provides 100%  estimated needs. 2840 kcal, 119 gm pro. 1896 mL 0  . oxyCODONE (OXY IR/ROXICODONE) 5 MG immediate release tablet Take 5 mg by mouth 2 (two) times daily as needed. (Patient not taking: No sig reported)     No current facility-administered medications for this encounter.    Physical Findings: The patient is in no acute distress. Patient is alert and oriented.  height is 5\' 9"  (1.753 m) and weight is 144 lb 3.2 oz (65.4 kg). His temperature is 97.6 F (36.4 C). His blood pressure is 124/86 and his pulse is 103 (abnormal). His respiration is 20 and oxygen saturation is 100%. Marland Kitchen    Marland KitchenHEENT: debris in mouth but no thrush.  No visible tumor on reconstructed tongue. Copious secretions.  Neck: skin is dry but healing - no  palpable adenopthy Lymph: as above; + lymphedema in anterior upper neck  Lab Findings: Lab Results  Component Value Date   WBC 4.8 01/12/2021   HGB 10.0 (L) 01/12/2021   HCT 32.2 (L) 01/12/2021   MCV 80.3 01/12/2021   PLT 286 01/12/2021    Radiographic Findings: No results found.  Impression/Plan:  Healing well from RT  F/u in June with restaging CT of neck and chest.  PT referral for lymphedema  Continue pushing nutrition.  On date of service, in total, I spent 15 minutes on this encounter. Patient was seen in person.  _____________________________________   Eppie Gibson, MD

## 2021-02-03 NOTE — Therapy (Signed)
[facility] [address] [place], [place], [address] Phone: [phone]   Fax:  [phone]  Speech Language Pathology Treatment  Patient Details  Name: [name] MRN: [id number] Date of Birth: 07/10/1976 Referring Provider (SLP): Eppie Gibson, MD   Encounter Date: 02/03/2021   End of Session - 02/03/21 1643    Visit Number 3    Number of Visits 10    Date for SLP Re-Evaluation 03/16/21    SLP Start Time 31    SLP Stop Time  1353    SLP Time Calculation (min) 35 min    Activity Tolerance Patient tolerated treatment well           Past Medical History:  Diagnosis Date  . Cancer ([place])    tongue    Past Surgical History:  Procedure Laterality Date  . DIRECT LARYNGOSCOPY N/A 09/05/2020   Procedure: DIRECT LARYNGOSCOPY;  Surgeon: Marcina Millard, MD;  Location: [facility];  Service: ENT;  Laterality: N/A;  . ESOPHAGOSCOPY N/A 09/05/2020   Procedure: ESOPHAGOSCOPY;  Surgeon: Marcina Millard, MD;  Location: [facility];  Service: ENT;  Laterality: N/A;  . FRACTURE SURGERY    . TONGUE BIOPSY N/A 09/05/2020   Procedure: TONGUE BIOPSY;  Surgeon: Marcina Millard, MD;  Location: [facility];  Service: ENT;  Laterality: N/A;    There were no vitals filed for this visit.   Subjective Assessment - 02/03/21 1319    Subjective Pt is trying some water as well as ice chips at this time - coughs "maybe half the time" with water.    Currently in Pain? No/denies                 ADULT SLP TREATMENT - 02/03/21 1324      General Information   Behavior/Cognition Alert;Cooperative;Pleasant mood      Treatment Provided   Treatment provided Dysphagia      Dysphagia Treatment   Temperature Spikes Noted No   denies any other s/sx of aspiration PNA   Respiratory Status Room air    Oral Cavity - Dentition Missing dentition    Treatment Methods Skilled observation;Compensation strategy training;Therapeutic  exercise;Upgraded PO texture trial;Patient/caregiver education    Patient observed directly with PO's No   due to last oral care at 10am   Pharyngeal Phase Signs & Symptoms Wet vocal quality;Audible swallow;Delayed throat clear   with secretions today during session   Other treatment/comments Pt reports he has been trying water as well as crushed ice. More coughing, he reports, with ice than with water. For both, he reports he is performing meticulous oral care prior. Pt told SLP independently the rationale for HEP, performed effortful swallow with indpependence. Pt completing piecemeal during the day 1-3 swallows. SLP told pt to complete 6-10 swallows at a time, 5 times a day; And then to cont the 1-3 reps at other times like he has been. Pt is spending at least 90 minutes to two hours/day swallowing with the ice chips or small water boluses. Pt told SLP 3 overt s/sx aspiration PNA with modified independence.      Assessment / Recommendations / Plan   Plan Continue with current plan of care      Progression Toward Goals   Progression toward goals Progressing toward goals              SLP Short Term Goals - 02/03/21 1647      SLP SHORT TERM GOAL #1  Title Pt will complete HEP with rare min A over two sessions    Baseline total A; 12-30-20    Period --   sessions, for all STGs   Status Achieved      SLP SHORT TERM GOAL #2   Title Pt will tell SLP 3 overt s/s aspiration PNA with modified independence    Baseline none given    Status Achieved      SLP SHORT TERM GOAL #3   Title Pt will tell SLP rationale for HEP completion    Baseline total A    Status Achieved            SLP Long Term Goals - 02/03/21 1648      SLP LONG TERM GOAL #1   Title Pt will complete HEP with modified independence over 2 visits    Baseline total A    Time 2    Period --   sessions, for all LTGs   Status On-going      SLP LONG TERM GOAL #2   Title pt will perform safety precautions from FEES with  PO ice chips with modified independence 2 sessions    Baseline total A    Time 2    Status On-going      SLP LONG TERM GOAL #3   Title Pt will tell SLP when to decr frequency of HEP    Baseline not provided yet    Time 3    Status On-going            Plan - 02/03/21 1644    Clinical Impression Statement Arshad presents today cont PEG tube dependent following significant oral and phayrngeal surgery in December requiring him to be NPO currently. Pt has effortful swallow exercise from [facility], per FEES report. No overt s/sx aspiration PNA reported or observed today. Pt has been completing exercises from [facility], however only a limited amount. SLP encouraged pt to complete more reps of effortful swallow (8-10), approx 5 times a day. Data suggest that pt's swallowing is threatened by muscle fibrosis that could develop after rad/chemorad is completed. Muscle disuse atrophy is also possible as pt has been PEG dependent many weeks. Therefore, SLP cont to stress that pt complete the indivdualized exercise program he rec'd at [facility] to mitigate/eliminate pt's muscle fibrosis following and as a result of, pt's rad tx. Pt to cont to perform swallowing with ice chips (or minute sips water) x3-5/day, for 15 minutes or until muscles feel tired - pt indicated he was performing after meticulous oral care, 2 hours/day. SLP highly stressed pt's need to perform oral care < 30 minutes prior to initiating PO ice chips. Skilled ST would be beneficial to the pt in order to regularly assess pt's safety with POs and/or need for instrumental swallow assessment, as well as to assess accurate completion of swallowing HEP.    Speech Therapy Frequency --   approx x2/month   Duration --   10 visits   Treatment/Interventions Aspiration precaution training;Pharyngeal strengthening exercises;Diet toleration management by SLP;Trials of upgraded texture/liquids;Patient/family education;Internal/external aids;SLP instruction and  feedback;Compensatory strategies;Other (comment)    Potential to Achieve Goals Fair    Potential Considerations Severity of impairments;Previous level of function    SLP Home Exercise Plan pt rec'd at [facility] after FEES 11-29-20    Consulted and Agree with Plan of Care Patient           Patient will benefit from skilled therapeutic intervention in order to improve the following deficits  and impairments:   Dysphagia, oropharyngeal phase  Severe dysarthria    Problem List Patient Active Problem List   Diagnosis Date Noted  . Cancer of anterior two-thirds of tongue ([place]) 11/23/2020  . HTN (hypertension) 10/15/2020  . Influenza vaccine refused 09/21/2020  . Hypertension 09/21/2020  . Daily consumption of alcohol 09/13/2020  . 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 Tongue carcinoma ([place])     [place] ,[place], [place]  02/03/2021, 4:51 PM  [facility] [address] [name], [place], [address] Phone: [phone]   Fax:  [phone]   Name: JERARDO COSTABILE MRN: [id number] Date of Birth: 28-Feb-1976

## 2021-02-03 NOTE — Progress Notes (Signed)
Nutrition follow-up completed with patient status post treatment for tongue cancer.  Weight documented as 144.2 pounds on April 6 increased from 127 pounds January 25.  Labs were reviewed. Patient is not eating food by mouth but is drinking water.  Reports he drinks 3 -16 ounce bottles daily. (1440 mL)  He has been increasing Osmolite 1.5 and reports he tolerates 8 cartons daily which has been well-tolerated and has supported patient's weight gain.  He is not ready to begin oral intake of food yet but hopes to be able to start adding foods in soon.  Revised estimated nutrition needs: 2500-2800 cal, 100-120 g protein, 2.8 L fluid.  Nutrition diagnosis: Unintended weight loss improved.  Intervention: Increase Osmolite 1.5 via low-profile gastrostomy tube, 2 cartons 4 times daily with 60 mL free water before and after bolus feedings.    Continue at least 2 -16 ounce bottles of water daily.  Add full liquids to soft solids as directed by MD/speech therapist.  New orders were written.  [employer] notified.  8 cartons Osmolite 1.5+ free water flushes provides 2840 cal, 119 g protein, 1908 mL free water.  Monitoring, evaluation, goals: Patient will continue to tolerate tube feeding.  Will begin full liquid/soft solids as tolerated.  Next visit: To be scheduled.  **Disclaimer: This note was dictated with voice recognition software. Similar sounding words can inadvertently be transcribed and this note may contain transcription errors which may not have been corrected upon publication of note.**

## 2021-02-09 ENCOUNTER — Ambulatory Visit: Payer: Medicaid Other | Admitting: Physical Therapy

## 2021-02-18 ENCOUNTER — Ambulatory Visit: Payer: Medicaid Other

## 2021-02-18 ENCOUNTER — Other Ambulatory Visit: Payer: Self-pay

## 2021-02-18 DIAGNOSIS — R1312 Dysphagia, oropharyngeal phase: Secondary | ICD-10-CM | POA: Diagnosis not present

## 2021-02-18 DIAGNOSIS — R471 Dysarthria and anarthria: Secondary | ICD-10-CM

## 2021-02-18 NOTE — Therapy (Signed)
[facility] [address] [place], [place], [address] Phone: [phone]   Fax:  [phone]  Speech Language Pathology Treatment  Patient Details  Name: [name] MRN: [id number] Date of Birth: 1976/08/21 Referring Provider (SLP): Eppie Gibson, MD   Encounter Date: 02/18/2021   End of Session - 02/18/21 1711    Visit Number 4    Number of Visits 10    Date for SLP Re-Evaluation 03/16/21    SLP Start Time 1320    SLP Stop Time  1356    SLP Time Calculation (min) 36 min    Activity Tolerance Patient tolerated treatment well           Past Medical History:  Diagnosis Date  . Cancer ([facility])    tongue    Past Surgical History:  Procedure Laterality Date  . DIRECT LARYNGOSCOPY N/A 09/05/2020   Procedure: DIRECT LARYNGOSCOPY;  Surgeon: Marcina Millard, MD;  Location: [facility];  Service: ENT;  Laterality: N/A;  . ESOPHAGOSCOPY N/A 09/05/2020   Procedure: ESOPHAGOSCOPY;  Surgeon: Marcina Millard, MD;  Location: [facility];  Service: ENT;  Laterality: N/A;  . FRACTURE SURGERY    . TONGUE BIOPSY N/A 09/05/2020   Procedure: TONGUE BIOPSY;  Surgeon: Marcina Millard, MD;  Location: [facility];  Service: ENT;  Laterality: N/A;    There were no vitals filed for this visit.   Subjective Assessment - 02/18/21 1330    Subjective Pt reports he has cont to swallow water boluses >1 teaspoon -"as long as I take my time, I'm coughing lesser (than 50% of the time)."    Currently in Pain? No/denies                 ADULT SLP TREATMENT - 02/18/21 1332      General Information   Behavior/Cognition Alert;Cooperative;Pleasant mood      Treatment Provided   Treatment provided Dysphagia      Dysphagia Treatment   Temperature Spikes Noted No    Respiratory Status Room air    Oral Cavity - Dentition Missing dentition    Treatment Methods Skilled observation;Compensation strategy training;Therapeutic exercise;Upgraded PO  texture trial;Patient/caregiver education    Type of PO's observed Thin liquids    Oral Phase Signs & Symptoms Anterior loss/spillage   suspected   Pharyngeal Phase Signs & Symptoms Wet vocal quality;Immediate cough;Multiple swallows   Consistently - told pt to just do ice chips and no more sips, and to do oral care before ice chips   Other treatment/comments Pt reports he is having water throughout the day and is not performing oral care prior to each set of boluses. See "S" statement. SLP reminded pt of the incr'd risk of PNA if he does NOT perform oral care prior to PO water, due to bacteria entering trachea and possibly then into lungs. [name] states water is the only thing he has had PO. Pt states he is willing to take that risk today with SLP and have water without performing oral care prior to ST.Given this, and that SLP shared with pt possible risks if he does this today, pt still wanted to take water today for ST. Pt coughed consistently with water, even with smaller sips and with using super supraglottic swallow. SLP told pt he would strongly recommend [name] after thorough oral care when at home. SPL further told pt to attempt 100 reps/day for effortful swallow at home. Pt voiced understanding. SLP questions if pt  will be ready for a follow up FEES or MBSS next month. At this time pt has not shown enough progress to warrant follow up objective study.      Assessment / Recommendations / Plan   Plan Continue with current plan of care      Dysphagia Recommendations   Diet recommendations NPO   ice chips at home after thorough oral care     Progression Toward Goals   Progression toward goals Progressing toward goals            SLP Education - 02/18/21 1710    Education Details oral care prior to ice chips, just do ice chips - no more water at home, do 100 reps of effortful swallow at home, ramifications of aspiration of oral bacteria in lungs    Person(s) Educated Patient     Methods Explanation    Comprehension Verbalized understanding            SLP Short Term Goals - 02/03/21 1647      SLP SHORT TERM GOAL #1   Title Pt will complete HEP with rare min A over two sessions    Baseline total A; 12-30-20    Period --   sessions, for all STGs   Status Achieved      SLP SHORT TERM GOAL #2   Title Pt will tell SLP 3 overt s/s aspiration PNA with modified independence    Baseline none given    Status Achieved      SLP SHORT TERM GOAL #3   Title Pt will tell SLP rationale for HEP completion    Baseline total A    Status Achieved            SLP Long Term Goals - 02/18/21 1715      SLP LONG TERM GOAL #1   Title Pt will complete HEP with modified independence over 2 visits    Baseline total A;  02/18/21    Time 1    Period --   sessions, for all LTGs   Status On-going      SLP LONG TERM GOAL #2   Title pt will perform safety precautions from FEES with PO ice chips with modified independence 2 sessions    Baseline total A    Time 1    Status On-going      SLP LONG TERM GOAL #3   Title Pt will tell SLP when to decr frequency of HEP    Baseline not provided yet    Time 2    Status On-going            Plan - 02/18/21 1712    Clinical Impression Statement [name] presents today cont PEG tube dependent following significant oral and phayrngeal surgery in December requiring him to be NPO currently. Pt has effortful swallow exercise from [facility], per FEES report. No overt s/sx aspiration PNA reported or observed today. [name] reports he has cont'd to complete exercises from [facility] told him to get in 100/day. Data suggest that pt's swallowing is threatened by muscle fibrosis that could develop after rad/chemorad is completed. Muscle disuse atrophy is also possible as pt has been PEG dependent many weeks. Therefore, SLP cont to stress that pt complete the indivdualized exercise program he rec'd at [facility] to mitigate/eliminate pt's muscle fibrosis  following and as a result of, pt's rad tx. SLP highly stressed pt's need to perform oral care < 30 minutes prior to initiating ONLY PO ice chips,  and not PO water boluses at home, due to consistent cough with all trials of sips water today in session - see "skiled intervention" in that pt knew he was risking aspiration of oral bacteria today in ST and told SLP he was fine with this risk. Skilled ST would cont to be beneficial to the pt in order to regularly assess pt's safety with POs and/or need for instrumental swallow assessment, as well as to assess accurate completion of swallowing HEP. Do not think pt has shown progress to warrant a follow up objective swallow eval yet.    Speech Therapy Frequency --   approx x2/month   Duration --   10 visits   Treatment/Interventions Aspiration precaution training;Pharyngeal strengthening exercises;Diet toleration management by SLP;Trials of upgraded texture/liquids;Patient/family education;Internal/external aids;SLP instruction and feedback;Compensatory strategies;Other (comment)    Potential to Achieve Goals Fair    Potential Considerations Severity of impairments;Previous level of function    SLP Home Exercise Plan pt rec'd at [facility] after FEES 11-29-20    Consulted and Agree with Plan of Care Patient           Patient will benefit from skilled therapeutic intervention in order to improve the following deficits and impairments:   Dysphagia, oropharyngeal phase  Severe dysarthria    Problem List Patient Active Problem List   Diagnosis Date Noted  . Cancer of anterior two-thirds of tongue ([place]) 11/23/2020  . HTN (hypertension) 10/15/2020  . Influenza vaccine refused 09/21/2020  . Hypertension 09/21/2020  . Daily consumption of alcohol 09/13/2020  . Squamous cell cancer of tongue ([place]) 09/13/2020  . Tongue carcinoma ([place])     [place] ,[place], [place]  02/18/2021, 5:16 PM  [facility] [address] [place], [place], [address] Phone: [phone]   Fax:  [phone]   Name: XAVIAR LUNN MRN: [id number] Date of Birth: 1975-12-09

## 2021-02-18 NOTE — Patient Instructions (Signed)
  I recommend only ice chips! And that you have these after a thorough cleaning of your mouth to reduce chance of bacteria entering your lungs.  Try to get 100 effortful swallows in per day!

## 2021-03-02 ENCOUNTER — Other Ambulatory Visit: Payer: Self-pay

## 2021-03-02 ENCOUNTER — Inpatient Hospital Stay: Payer: Medicaid Other | Attending: Hematology and Oncology | Admitting: Hematology and Oncology

## 2021-03-02 ENCOUNTER — Encounter: Payer: Self-pay | Admitting: Hematology and Oncology

## 2021-03-02 ENCOUNTER — Inpatient Hospital Stay: Payer: Medicaid Other

## 2021-03-02 VITALS — BP 141/95 | HR 70 | Temp 97.0°F | Resp 18 | Ht 69.0 in | Wt 145.8 lb

## 2021-03-02 DIAGNOSIS — Z79899 Other long term (current) drug therapy: Secondary | ICD-10-CM | POA: Insufficient documentation

## 2021-03-02 DIAGNOSIS — E538 Deficiency of other specified B group vitamins: Secondary | ICD-10-CM

## 2021-03-02 DIAGNOSIS — Z801 Family history of malignant neoplasm of trachea, bronchus and lung: Secondary | ICD-10-CM | POA: Insufficient documentation

## 2021-03-02 DIAGNOSIS — D649 Anemia, unspecified: Secondary | ICD-10-CM | POA: Diagnosis not present

## 2021-03-02 DIAGNOSIS — C023 Malignant neoplasm of anterior two-thirds of tongue, part unspecified: Secondary | ICD-10-CM

## 2021-03-02 DIAGNOSIS — F1290 Cannabis use, unspecified, uncomplicated: Secondary | ICD-10-CM | POA: Insufficient documentation

## 2021-03-02 DIAGNOSIS — Z87891 Personal history of nicotine dependence: Secondary | ICD-10-CM | POA: Insufficient documentation

## 2021-03-02 DIAGNOSIS — Z923 Personal history of irradiation: Secondary | ICD-10-CM | POA: Diagnosis not present

## 2021-03-02 DIAGNOSIS — D509 Iron deficiency anemia, unspecified: Secondary | ICD-10-CM | POA: Diagnosis not present

## 2021-03-02 DIAGNOSIS — Z823 Family history of stroke: Secondary | ICD-10-CM | POA: Diagnosis not present

## 2021-03-02 DIAGNOSIS — Z1329 Encounter for screening for other suspected endocrine disorder: Secondary | ICD-10-CM

## 2021-03-02 LAB — BASIC METABOLIC PANEL - CANCER CENTER ONLY
Anion gap: 9 (ref 5–15)
BUN: 12 mg/dL (ref 6–20)
CO2: 30 mmol/L (ref 22–32)
Calcium: 10.1 mg/dL (ref 8.9–10.3)
Chloride: 101 mmol/L (ref 98–111)
Creatinine: 0.75 mg/dL (ref 0.61–1.24)
GFR, Estimated: >60 mL/min (ref >60)
Glucose, Bld: 91 mg/dL (ref 70–99)
Potassium: 4.3 mmol/L (ref 3.5–5.1)
Sodium: 140 mmol/L (ref 135–145)

## 2021-03-02 LAB — CBC WITH DIFFERENTIAL/PLATELET
Abs Immature Granulocytes: 0.01 10*3/uL (ref 0.00–0.07)
Basophils Absolute: 0 10*3/uL (ref 0.0–0.1)
Basophils Relative: 1 %
Eosinophils Absolute: 0 10*3/uL (ref 0.0–0.5)
Eosinophils Relative: 1 %
HCT: 41.7 % (ref 39.0–52.0)
Hemoglobin: 13.3 g/dL (ref 13.0–17.0)
Immature Granulocytes: 0 %
Lymphocytes Relative: 22 %
Lymphs Abs: 0.7 10*3/uL (ref 0.7–4.0)
MCH: 27 pg (ref 26.0–34.0)
MCHC: 31.9 g/dL (ref 30.0–36.0)
MCV: 84.8 fL (ref 80.0–100.0)
Monocytes Absolute: 0.3 10*3/uL (ref 0.1–1.0)
Monocytes Relative: 10 %
Neutro Abs: 2.1 10*3/uL (ref 1.7–7.7)
Neutrophils Relative %: 66 %
Platelets: 201 10*3/uL (ref 150–400)
RBC: 4.92 MIL/uL (ref 4.22–5.81)
RDW: 18.2 % — ABNORMAL HIGH (ref 11.5–15.5)
WBC: 3.2 10*3/uL — ABNORMAL LOW (ref 4.0–10.5)
nRBC: 0 % (ref 0.0–0.2)

## 2021-03-02 LAB — FERRITIN: Ferritin: 22 ng/mL — ABNORMAL LOW (ref 24–336)

## 2021-03-02 LAB — MAGNESIUM: Magnesium: 1.7 mg/dL (ref 1.7–2.4)

## 2021-03-02 LAB — IRON AND TIBC
Iron: 168 ug/dL (ref 45–182)
Saturation Ratios: 33 % (ref 17.9–39.5)
TIBC: 512 ug/dL — ABNORMAL HIGH (ref 250–450)
UIBC: 344 ug/dL

## 2021-03-02 LAB — VITAMIN B12: Vitamin B-12: 385 pg/mL (ref 180–914)

## 2021-03-02 LAB — PHOSPHORUS: Phosphorus: 3.4 mg/dL (ref 2.5–4.6)

## 2021-03-02 NOTE — Progress Notes (Signed)
[facility] NOTE  Patient Care Team: Pcp, No as PCP - General Eppie Gibson, MD as Consulting Physician (Radiation Oncology) Malmfelt, Stephani Police, RN as Oncology Nurse Navigator Fredricka Bonine, MD as Referring Physician (Otolaryngology) Benay Pike, MD as Consulting Physician (Hematology and Oncology)  CHIEF COMPLAINTS/PURPOSE OF CONSULTATION:  Squamous cell carcinoma of oral cavity  ASSESSMENT & PLAN:  Cancer of anterior two-thirds of tongue ([place]) 1. [name] is a 45 y.o. 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 He is s/p total glossectomy via mandibular split approach, partial pharyngectomy, bilateral neck dissection, ALT free flap reconstruction on 10/15/20. Final pathology showed 5.4 cms tumor with DOI of 2.6 cms. No evidence of LVI or perineural invasion, margins free of tumor, closest approach is 0.3 cms from posterior soft tissue margin. 54 LN negative.  He completed adjuvant radiation and is recovering well. He continues with Dr Isidore Moos for adjuvant radiation and FU.   Normocytic normochromic anemia Improving with iron and B12 supplementation. He feels well, no fatigue or SOB. CBC showed complete correction of hemoglobin B 12 within normal limits Iron panel pending.  Orders Placed This Encounter  Procedures  . CBC with Differential/Platelet    Standing Status:   Standing    Number of Occurrences:   22    Standing Expiration Date:   03/02/2022  . Iron and TIBC    Standing Status:   Future    Number of Occurrences:   1    Standing Expiration Date:   03/02/2022  . Ferritin    Standing Status:   Future    Number of Occurrences:   1    Standing Expiration Date:   03/02/2022  . Vitamin B12    Standing Status:   Future    Number of Occurrences:   1    Standing Expiration Date:   03/02/2022    HISTORY OF PRESENTING  ILLNESS:   [name] 45 y.o. male is here because of new diagnosis of left tongue cancer.   He arrived with his family member today to the appointment.  He tells me that he first noticed some pain in the left tongue but attributed this to back to scraping his tongue.  He went in to see his dentist and was found to have this left tongue mass which was biopsied back in November and demonstrated invasive squamous cell carcinoma.  He then underwent MRI imaging and surgery at [facility], had total glossectomy, partial pharyngectomy, bilateral neck dissection and is following up with medical oncology and radiation oncology for adjuvant recommendations.  Biopsies revealed:  09/05/2020 FINAL MICROSCOPIC DIAGNOSIS:  A. TONGUE, LEFT BASE, BIOPSY:  - Invasive squamous cell carcinoma.  COMMENT:  There is no perineural or lymphovascular invasion. The carcinoma appears well differentiated ADDENDUM:  P16 is only focally and weakly positive in the invasive tumor (<5%).  10/12/2020 IMPRESSION: Large mass within the tongue apparently restricted to the left side, without evidence of crossing of the tongue midline. Lesion in total measures about 7 cm from front to back. Lesion extends all the way from the superior surface of the anterior tongue back to the posterior tongue and base of the left. Tumor bulges into the sublingual region on the left. This appears very similar to the previous CT, as best as the 2 techniques can be compared. Invasion of the left submandibular gland is  not demonstrated. There is edematous change of the adjacent soft tissues, questionably secondary to regional radiation. Mild edema and enhancement of level 2 nodes on the left likely related to radiation. No evidence of  cervical lymph node enlargement or necrosis.  12/17. Had total glossectomy via mandibular split approach, partial pharyngectomy, bilateral neck dissection, ALT free flap reconstruction on 10/15/20. Final  pathology showed 5.4 cms tumor with DOI of 2.6 cms. No evidence of LVI or perineural invasion, margins free of tumor, closest approach is 0.3 cms from posterior soft tissue margin. 54 LN negative. He completed radiation 01/19/2021  Interval History  He is here for FU on his anemia. He completed radiation, everything went well, no complaints. He is able to drink water, mostly using G tube for feeding He is taking iron and B12 regularly. Feels energetic. Rest of the pertinent 10 point ROS reviewed and negative.  MEDICAL HISTORY:  Past Medical History:  Diagnosis Date  . Cancer ([place])    tongue    SURGICAL HISTORY: Past Surgical History:  Procedure Laterality Date  . DIRECT LARYNGOSCOPY N/A 09/05/2020   Procedure: DIRECT LARYNGOSCOPY;  Surgeon: Marcina Millard, MD;  Location: [place];  Service: ENT;  Laterality: N/A;  . ESOPHAGOSCOPY N/A 09/05/2020   Procedure: ESOPHAGOSCOPY;  Surgeon: Marcina Millard, MD;  Location: [place];  Service: ENT;  Laterality: N/A;  . FRACTURE SURGERY    . TONGUE BIOPSY N/A 09/05/2020   Procedure: TONGUE BIOPSY;  Surgeon: Marcina Millard, MD;  Location: [facility] OR;  Service: ENT;  Laterality: N/A;    SOCIAL HISTORY: Social History   Socioeconomic History  . Marital status: Single    Spouse name: Not on file  . Number of children: Not on file  . Years of education: Not on file  . Highest education level: Not on file  Occupational History  . Not on file  Tobacco Use  . Smoking status: Former Smoker    Packs/day: 1PPD for 25 yrs    Types: Cigarettes  . Smokeless tobacco: Never Used  Vaping Use  . Vaping Use: Never used  Substance and Sexual Activity  . Alcohol use: Not Currently  . Drug use: Yes    Types: Marijuana  . Sexual activity: Not Currently  Other Topics Concern  . Not on file  Social History Narrative  . Not on file   Social Determinants of Health   Financial Resource Strain: Not on file  Food Insecurity: Not on file   Transportation Needs: Not on file  Physical Activity: Not on file  Stress: Not on file  Social Connections: Not on file  Intimate Partner Violence: Not on file    FAMILY HISTORY: Family History  Problem Relation Age of Onset  . Lung cancer Mother   . Stroke Father   . Lung cancer Sister   . Lung cancer Maternal Grandmother     ALLERGIES:  has no allergies on file.  MEDICATIONS:  Current Outpatient Medications  Medication Sig Dispense Refill  . ferrous sulfate 325 (65 FE) MG EC tablet Take 1 tablet (325 mg total) by mouth 2 (two) times daily with a meal. 60 tablet 2  . ibuprofen (ADVIL) 800 MG tablet Take 1 tablet (800 mg total) by mouth every 8 (eight) hours as needed. Take with food. 60 tablet 0  . Nutritional Supplements (FEEDING SUPPLEMENT, OSMOLITE 1.5 CAL,) LIQD Increase Osmolite 1.5 to 8 cartons daily with 60 mL free water before and after each bolus feeding. Feed via low  profile gastrostomy tube. Provides 100% estimated needs. 2840 kcal, 119 gm pro. 1896 mL 0  . chlorthalidone (HYGROTON) 25 MG tablet Take 1 tablet (25 mg total) by mouth daily. (Patient not taking: No sig reported) 90 tablet 3  . Cyanocobalamin (B-12) 1000 MCG SUBL Place 1,000 mcg under the tongue daily. (Patient not taking: No sig reported) 30 tablet 2  . ferrous sulfate 325 (65 FE) MG tablet TAKE 1 TABLET BY MOUTH 2 TIMES DAILY WITH A MEAL (Patient not taking: Reported on 02/02/2021) 60 tablet 2  . lidocaine (XYLOCAINE) 2 % solution Patient: Mix 1part 2% viscous lidocaine, 1part H20. Swish & swallow 57mL of diluted mixture, 20min before meals and at bedtime, up to QID (Patient not taking: No sig reported) 200 mL 3  . oxyCODONE (OXY IR/ROXICODONE) 5 MG immediate release tablet Take 5 mg by mouth 2 (two) times daily as needed. (Patient not taking: No sig reported)     No current facility-administered medications for this visit.     PHYSICAL EXAMINATION: ECOG PERFORMANCE STATUS: 0 - Asymptomatic  Vitals:    03/02/21 1043  BP: (!) 141/95  Pulse: 70  Resp: 18  Temp: (!) 97 F (36.1 C)  SpO2: 100%   Filed Weights   03/02/21 1043  Weight: 145 lb 12.8 oz (66.1 kg)   Physical Exam Constitutional:      Appearance: Normal appearance. He is normal weight.  HENT:     Head: Normocephalic and atraumatic.  Eyes:     Extraocular Movements: Extraocular movements intact.  Cardiovascular:     Rate and Rhythm: Normal rate and regular rhythm.     Pulses: Normal pulses.  Pulmonary:     Effort: Pulmonary effort is normal.     Breath sounds: Normal breath sounds.  Abdominal:     General: Abdomen is flat. Bowel sounds are normal.  Musculoskeletal:        General: No swelling. Normal range of motion.     Cervical back: Normal range of motion and neck supple. No rigidity.  Lymphadenopathy:     Cervical: No cervical adenopathy.  Skin:    General: Skin is warm and dry.  Neurological:     General: No focal deficit present.     Mental Status: He is alert.  Psychiatric:        Mood and Affect: Mood normal.        Behavior: Behavior normal.     LABORATORY DATA:  I have reviewed the data as listed Lab Results  Component Value Date   WBC 3.2 (L) 03/02/2021   HGB 13.3 03/02/2021   HCT 41.7 03/02/2021   MCV 84.8 03/02/2021   PLT 201 03/02/2021     Chemistry      Component Value Date/Time   NA 140 03/02/2021 1122   K 4.3 03/02/2021 1122   CL 101 03/02/2021 1122   CO2 30 03/02/2021 1122   BUN 12 03/02/2021 1122   CREATININE 0.75 03/02/2021 1122      Component Value Date/Time   CALCIUM 10.1 03/02/2021 1122   ALKPHOS 73 11/09/2018 1126   AST 57 (H) 11/09/2018 1126   ALT 34 11/09/2018 1126   BILITOT 1.9 (H) 11/09/2018 1126     I have reviewed pertinent labs. HB completely normalized B12 is normal.  RADIOGRAPHIC STUDIES: I have personally reviewed the radiological images as listed and agreed with the findings in the report.  No results found.  All questions were answered. The  patient knows to call the  clinic with any problems, questions or concerns.      Benay Pike, MD 03/02/2021 12:57 PM

## 2021-03-02 NOTE — Assessment & Plan Note (Signed)
Improving with iron and B12 supplementation. He feels well, no fatigue or SOB. CBC showed complete correction of hemoglobin B 12 within normal limits Iron panel pending.

## 2021-03-02 NOTE — Assessment & Plan Note (Signed)
1. [name] is a 45 y.o. 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 He is s/p total glossectomy via mandibular split approach, partial pharyngectomy, bilateral neck dissection, ALT free flap reconstruction on 10/15/20. Final pathology showed 5.4 cms tumor with DOI of 2.6 cms. No evidence of LVI or perineural invasion, margins free of tumor, closest approach is 0.3 cms from posterior soft tissue margin. 54 LN negative.  He completed adjuvant radiation and is recovering well. He continues with Dr Isidore Moos for adjuvant radiation and FU.

## 2021-03-04 ENCOUNTER — Ambulatory Visit: Payer: Medicaid Other | Attending: Radiation Oncology

## 2021-03-04 ENCOUNTER — Other Ambulatory Visit: Payer: Self-pay

## 2021-03-04 DIAGNOSIS — R1312 Dysphagia, oropharyngeal phase: Secondary | ICD-10-CM | POA: Diagnosis present

## 2021-03-04 DIAGNOSIS — R471 Dysarthria and anarthria: Secondary | ICD-10-CM | POA: Diagnosis present

## 2021-03-06 NOTE — Therapy (Signed)
[facility] [address] [place], [place], [address] Phone: [phone]   Fax:  [phone]  Speech Language Pathology Treatment  Patient Details  Name: [name] MRN: [id number] Date of Birth: [birth year] Referring Provider (SLP): Eppie Gibson, MD   Encounter Date: 03/04/2021   End of Session - 03/06/21 1942    Visit Number 5    Number of Visits 10    Date for SLP Re-Evaluation 03/16/21    SLP Start Time 55    SLP Stop Time  1400    SLP Time Calculation (min) 41 min    Activity Tolerance Patient tolerated treatment well           Past Medical History:  Diagnosis Date  . Cancer ([place])    tongue    Past Surgical History:  Procedure Laterality Date  . DIRECT LARYNGOSCOPY N/A 09/05/2020   Procedure: DIRECT LARYNGOSCOPY;  Surgeon: Marcina Millard, MD;  Location: [place];  Service: ENT;  Laterality: N/A;  . ESOPHAGOSCOPY N/A 09/05/2020   Procedure: ESOPHAGOSCOPY;  Surgeon: Marcina Millard, MD;  Location: [place];  Service: ENT;  Laterality: N/A;  . FRACTURE SURGERY    . TONGUE BIOPSY N/A 09/05/2020   Procedure: TONGUE BIOPSY;  Surgeon: Marcina Millard, MD;  Location: [place];  Service: ENT;  Laterality: N/A;    There were no vitals filed for this visit.   Subjective Assessment - 03/06/21 1939    Subjective "I've been trying to do them."                 ADULT SLP TREATMENT - 03/06/21 0001      General Information   Behavior/Cognition Alert;Cooperative;Pleasant mood      Treatment Provided   Treatment provided Dysphagia      Dysphagia Treatment   Respiratory Status Room air    Patient observed directly with PO's No   pt politely declined due to no oral care <30 minutes prior to ST   Pharyngeal Phase Signs & Symptoms Delayed throat clear;Immediate throat clear;Wet vocal quality;Audible swallow    Other treatment/comments Pt cont to drink water (~3-4 bottles/day) despite SLP cautioning  him against this last session. Pt cont to deny overt s/sx of aspiration PNA to date. [name] told SLP that he is going to [facility] for f/u swallow eval/consult 03-25-21, however after he confirmed the date saw that it was actually April 25, 2021. Pt states he has been doing ice chips for approx an hour "5 days a week." He has completed effortful swallow 20-25x/day. SLP reiterated that 100/day was an overstatement but SLP wanted pt to think about doing as many as he could perform/day. SLP directly told [name] he would like pt to achieve minimim of 50/day and suggested 10 reps 5 times a day - SLP wanted pt to put alarms in his phone for reminder/s to complete the exercises and [name] told SLP that he would just make sure to do them during the day, and thought he might do them when he does his tube feedings - 8/day making him do 80/day - SLP congratulated him on this suggestion. Today, pt reports he did oral care before ST but not <30 minutes. Pt told SLP he knows the risk and is going to wait to perform any hard swallows with POs until he can perform oral care <30 minutes prior to POs. Pt performed 10 hard swallows, with notable fatigue after 5 reps. First 5 took pt 25 seconds and  last 5 took pt 40 seconds to complete. After 10 minutes break, 5 more reps took pt 90 seconds to complete.      Assessment / Recommendations / Plan   Plan Continue with current plan of care      Dysphagia Recommendations   Diet recommendations NPO      Progression Toward Goals   Progression toward goals Not progressing toward goals (comment)   pt not performing HEP with frequency prescribed by SLP           SLP Education - 03/06/21 1940    Education Details cautioned pt about water at home, need to perform oral care prior to ice chips, needs to perform at [facility] effortful swalllows/day    Person(s) Educated Patient    Methods Explanation    Comprehension Verbalized understanding            SLP Short Term Goals -  02/03/21 1647      SLP SHORT TERM GOAL #1   Title Pt will complete HEP with rare min A over two sessions    Baseline total A; 12-30-20    Period --   sessions, for all STGs   Status Achieved      SLP SHORT TERM GOAL #2   Title Pt will tell SLP 3 overt s/s aspiration PNA with modified independence    Baseline none given    Status Achieved      SLP SHORT TERM GOAL #3   Title Pt will tell SLP rationale for HEP completion    Baseline total A    Status Achieved            SLP Long Term Goals - 03/06/21 1946      SLP LONG TERM GOAL #1   Title Pt will complete HEP with modified independence over 2 visits    Baseline total A;  02/18/21    Time 1    Period --   sessions, for all LTGs   Status On-going      SLP LONG TERM GOAL #2   Title pt will perform safety precautions from FEES with PO ice chips with modified independence 2 sessions    Baseline total A    Time 1    Status On-going      SLP LONG TERM GOAL #3   Title Pt will tell SLP when to decr frequency of HEP    Baseline not provided yet    Time 2    Status On-going            Plan - 03/06/21 1942    Clinical Impression Statement [name] presents today cont PEG tube dependent following significant oral and phayrngeal surgery in December requiring him to be NPO currently. Pt has effortful swallow exercise from [facility], per FEES report. No overt s/sx aspiration PNA reported or observed today. [name] reports he has cont'd to complete effortful swallow but only 20-25/day, which was below the SLP recommended reps - SLP told him to get in at least 50/day. Data suggest that pt's swallowing is threatened by muscle fibrosis that could develop after rad/chemorad is completed. Muscle disuse atrophy is also possible as pt has been PEG dependent many weeks. Therefore, SLP cont to stress that pt complete the indivdualized exercise program he rec'd at [facility] to mitigate/eliminate pt's muscle fibrosis following and as a result of, pt's rad  tx. [name] cont to drink water at home (he reports 3-4 bottles/day). SLP highly stressed pt's need to perform oral care <  30 minutes prior to performing *ONLY PO ice chips*, and not PO water boluses at home, due to pt unsafe with water - see "skiled intervention" for more details of today's session. Skilled ST would cont to be beneficial to the pt in order to regularly assess pt's safety with POs and/or need for instrumental swallow assessment, as well as to assess accurate completion of swallowing HEP. SLP continues to think pt has not shown progress to warrant a follow up objective swallow eval yet. Pt reported he has a follow up with ENT and ST at [facility] on 04-25-21.    Speech Therapy Frequency --   approx x2/month   Duration --   10 visits   Treatment/Interventions Aspiration precaution training;Pharyngeal strengthening exercises;Diet toleration management by SLP;Trials of upgraded texture/liquids;Patient/family education;Internal/external aids;SLP instruction and feedback;Compensatory strategies;Other (comment)    Potential to Achieve Goals Fair    Potential Considerations Severity of impairments;Previous level of function    SLP Home Exercise Plan pt rec'd at [facility] after FEES 11-29-20    Consulted and Agree with Plan of Care Patient           Patient will benefit from skilled therapeutic intervention in order to improve the following deficits and impairments:   Dysphagia, oropharyngeal phase  Severe dysarthria    Problem List Patient Active Problem List   Diagnosis Date Noted  . Normocytic normochromic anemia 03/02/2021  . Cancer of anterior two-thirds of tongue ([place]) 11/23/2020  . HTN (hypertension) 10/15/2020  . Influenza vaccine refused 09/21/2020  . Hypertension 09/21/2020  . Daily consumption of alcohol 09/13/2020  . Squamous cell cancer of tongue ([place]) 09/13/2020  . Tongue carcinoma ([place])     [place] ,[employer], [place]  03/06/2021, 7:47 PM  [facility] [address] [place], [place], [address] Phone: [phone]   Fax:  [phone]   Name: [name] MRN: [id number] Date of Birth: 1976/02/25

## 2021-03-07 ENCOUNTER — Ambulatory Visit: Payer: Medicaid Other | Admitting: Nutrition

## 2021-03-07 NOTE — Progress Notes (Signed)
Nutrition follow-up completed with patient status post treatment for tongue cancer. Weight improved and was documented as 149.4 pounds today increased from 144.2 pounds on April 6.  Labs were reviewed.  Patient reports he has not been eating very much food but has tried applesauce and oatmeal.  He reports he tolerates water by mouth.  He has not tried oral nutrition supplements.  He continues to use 8 cartons of Osmolite 1.5 via feeding tube without difficulty.  He continues to flush his feeding tube with water before and after bolus feedings.  He is concerned he is going to run out of Osmolite 1.5 today.  He is requesting samples.  Estimated nutrition needs: 2500-2800 cal, 100-120 g protein, 2.8 L fluid.  8 cartons Osmolite 1.5+ free water flushes provides 2840 cal, 119 g protein, 1908 mL free water.  This is 100% estimated calorie and protein needs.  Nutrition diagnosis: Unintended weight loss improved.  Intervention: Continue 8 cartons Osmolite 1.5 via low-profile gastrostomy tube, 2 cartons 4 times a day with 60 mL of free water before and after bolus feeding. Continue water by mouth as well as other soft foods as tolerated. Try 1 carton of chocolate Ensure Enlive daily by mouth.  Provided 1 sample case of Ensure Enlive and 1 sample case of Osmolite 1.5. Teach back method used.  Questions were answered.  Monitoring, evaluation, goals: Patient will tolerate adequate calories and protein for weight gain and healing.  Next visit: Phone call in approximately 1 month.  **Disclaimer: This note was dictated with voice recognition software. Similar sounding words can inadvertently be transcribed and this note may contain transcription errors which may not have been corrected upon publication of note.**

## 2021-03-08 NOTE — Progress Notes (Signed)
  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   Plan: The patient will follow-up with radiation oncology in 2-3wks. -----------------------------------  Eppie Gibson, MD

## 2021-03-10 ENCOUNTER — Telehealth: Payer: Self-pay | Admitting: *Deleted

## 2021-03-10 NOTE — Telephone Encounter (Signed)
CALLED PATIENT TO ASK ABOUT SEEING THE NUTRITIONIST AFTER FU VISIT WITH DR. Isidore Moos ON 04-19-21, PATIENT AGREED TO DO SO, INFORMED BARBARA NEFF

## 2021-03-18 ENCOUNTER — Ambulatory Visit: Payer: Medicaid Other

## 2021-03-18 ENCOUNTER — Other Ambulatory Visit: Payer: Self-pay

## 2021-03-18 ENCOUNTER — Ambulatory Visit (INDEPENDENT_AMBULATORY_CARE_PROVIDER_SITE_OTHER): Payer: Self-pay | Admitting: Dentistry

## 2021-03-18 DIAGNOSIS — K036 Deposits [accretions] on teeth: Secondary | ICD-10-CM

## 2021-03-18 DIAGNOSIS — C023 Malignant neoplasm of anterior two-thirds of tongue, part unspecified: Secondary | ICD-10-CM

## 2021-03-18 DIAGNOSIS — R432 Parageusia: Secondary | ICD-10-CM

## 2021-03-18 DIAGNOSIS — R252 Cramp and spasm: Secondary | ICD-10-CM

## 2021-03-18 MED ORDER — SODIUM FLUORIDE 1.1 % DT CREA: TOPICAL_CREAM | DENTAL | 11 refills | Status: DC

## 2021-03-18 NOTE — Progress Notes (Signed)
Department of Dental Medicine     LIMITED EXAM  Service Date:   03/18/2021  Patient Name:  [name] Date of Birth:   1976-04-20 Medical Record Number: [id number]  TODAY'S VISIT  RE Assessment:   . Trismus . Dysgeusia  Recommendations:  . Brush after meals and at bedtime.  Use sodium fluoride toothpaste that was prescribed to you at bedtime. . Use trismus exercises as directed. . Use CLoSYs for dry mouth and salt water/baking soda rinses for any mouth sores. . Take multiple sips of water as needed. Plan: . Return for new patient exam/cleaning.  Call if any questions or concerns arise. Marland Kitchen Rx:  Sodium fluoride 1.1% toothpaste  Discussed in detail all treatment options and recommendations with the patient and they are agreeable to the plan.CO  PROGRESS NOTE:   COVID-19 SCREENING:  The patient denies symptoms concerning for COVID-19 infection including fever, chills, cough, or newly developed shortness of breath.   HISTORY OF PRESENT ILLNESS: . [name] is a 45 y.o. male who presents today for an oral examination after radiation therapy for tongue cancer.  The patient has completed 30 of 30 radiation treatments from 12/09/20 thru 01/19/21.  The patient did not have any chemotherapy.   REVIEW OF CHIEF COMPLAINTS: . Dry mouth: No . Hard to swallow: No . Hurts to swallow:  No . Taste changes:  Yes; taste is slowly returning . Sores in mouth:  No . Limited opening:  Patient denied symptoms . Weight loss:  Yes; continues to improve 149 lbs up from 122 lbs since finishing therapy. He went from 160 lbs down to 122 lbs during treatment.   AT- HOME ORAL HYGIENE REGIMEN: . Brushing:  Yes, once a day . Flossing:  No . Rinsing:  Yes, uses baking soda and salt water rinses as needed for symptom relief . Fluoride trays:  No . Trismus exercises:  No Maximum Interincisal Opening:  25 mm today and 25 mm measured at initial visit.   Patient Active Problem List    Diagnosis Date Noted  . Normocytic normochromic anemia 03/02/2021  . Cancer of anterior two-thirds of tongue ([place]) 11/23/2020  . HTN (hypertension) 10/15/2020  . Influenza vaccine refused 09/21/2020  . Hypertension 09/21/2020  . Daily consumption of alcohol 09/13/2020  . 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 Tongue carcinoma ([place])    Past Medical History:  Diagnosis Date  . Cancer ([place])    tongue   Past Surgical History:  Procedure Laterality Date  . DIRECT LARYNGOSCOPY N/A 09/05/2020   Procedure: DIRECT LARYNGOSCOPY;  Surgeon: Marcina Millard, MD;  Location: [facility];  Service: ENT;  Laterality: N/A;  . ESOPHAGOSCOPY N/A 09/05/2020   Procedure: ESOPHAGOSCOPY;  Surgeon: Marcina Millard, MD;  Location: [facility];  Service: ENT;  Laterality: N/A;  . FRACTURE SURGERY    . TONGUE BIOPSY N/A 09/05/2020   Procedure: TONGUE BIOPSY;  Surgeon: Marcina Millard, MD;  Location: [facility];  Service: ENT;  Laterality: N/A;   Current Outpatient Medications  Medication Sig Dispense Refill  . chlorthalidone (HYGROTON) 25 MG tablet Take 1 tablet (25 mg total) by mouth daily. (Patient not taking: No sig reported) 90 tablet 3  . Cyanocobalamin (B-12) 1000 MCG SUBL Place 1,000 mcg under the tongue daily. (Patient not taking: No sig reported) 30 tablet 2  . ferrous sulfate 325 (65 FE) MG EC tablet Take 1 tablet (325 mg total) by mouth 2 (two) times daily with a  meal. 60 tablet 2  . ferrous sulfate 325 (65 FE) MG tablet TAKE 1 TABLET BY MOUTH 2 TIMES DAILY WITH A MEAL (Patient not taking: Reported on 02/02/2021) 60 tablet 2  . ibuprofen (ADVIL) 800 MG tablet Take 1 tablet (800 mg total) by mouth every 8 (eight) hours as needed. Take with food. 60 tablet 0  . lidocaine (XYLOCAINE) 2 % solution Patient: Mix 1part 2% viscous lidocaine, 1part H20. Swish & swallow 2mL of diluted mixture, 54min before meals and at bedtime, up to QID (Patient not taking: No sig reported) 200 mL 3  . Nutritional  Supplements (FEEDING SUPPLEMENT, OSMOLITE 1.5 CAL,) LIQD Increase Osmolite 1.5 to 8 cartons daily with 60 mL free water before and after each bolus feeding. Feed via low profile gastrostomy tube. Provides 100% estimated needs. 2840 kcal, 119 gm pro. 1896 mL 0  . oxyCODONE (OXY IR/ROXICODONE) 5 MG immediate release tablet Take 5 mg by mouth 2 (two) times daily as needed. (Patient not taking: No sig reported)     No current facility-administered medications for this visit.   Not on File   VITALS: BP (!) 158/97 (BP Location: Right Arm)   Pulse 71   Temp 98.5 F (36.9 C) (Oral)   Wt 149 lb (67.6 kg)   BMI 22.00 kg/m    DENTAL EXAM: Oral hygiene (plaque):  Yes, generalized slight Mucositis:  No Description of saliva:  Normal secretions and consistency observed today Exposed bone:  No Other watched areas:  No   ASSESSMENT/DIAGNOSES:  1. Xerostomia:  If he has dry mouth it is very mild and he is asymptomatic.   2. Dysgeusia:  His taste continues to slowly return. 3. Weight Loss:  He lost a lot of weight during the duration of radiation, but he is gaining it back and appears healthy. 4. Accretions:  Light calculus accumulation on lower anterior teeth. 5. Trismus:  He has had limited opening since his cancer diagnosis, but this has not decreased/improved and currently remains the same.  He denies any symptoms with this.   PLAN AND RECOMMENDATIONS: . Brush after meals and at bedtime.  Use the sodium fluoride toothpaste prescribed to you today at bedtime. . Use trismus exercises as directed. . Use CLoSYs for dry mouth and salt water/baking soda rinses for any mouth sores. . Take multiple sips of water as needed. . Return to regular dentist for routine dental care including cleanings, periodic exams and replacement of missing teeth as needed.  We believe the patient's insurance will cover preventative dental treatment and if so, the patient elected to return to the hospital clinic for  comprehensive dental care since he does not have a regular dentist. . Call if any problems or concerns arise.  o All questions and concerns were invited and addressed.  The patient tolerated today's visit well and departed in stable condition.   [place] Benson [place], D.M.D.

## 2021-03-18 NOTE — Patient Instructions (Signed)
[facility] Department of Dental Medicine Clarisa Danser B. Raymond Bhardwaj, D.M.D. Phone: [phone] Fax: [phone]   It was a pleasure seeing you today!  Please refer to the information below regarding your dental visit with us.  Call us if any questions or concerns come up after you leave.   Thank you for giving us the opportunity to provide care for you.  If there is anything we can do for you, please let us know.    RECOMMENDATIONS: 1. Brush after meals and at bedtime.  Floss once a day, and use fluoride at bedtime. 2. Use trismus exercises as directed below. 3. Use CLOSYs for dry mouth, and salt water/baking soda rinses to help with any mouth sores. 4. Take multiple sips of water as needed.  Stay hydrated. 5. Return to your regular dentist for routine dental care including cleanings and periodic exams.  If you do not have a regular dentist, it is important to establish care at an outside dental office of your choice.  6. Call us if any problems or concerns arise.    TRISMUS: Trismus is a condition where the jaw does not allow the mouth to open as wide as it usually does.  This can happen almost suddenly, or in other cases the process is so slow, it is hard to notice it-until it is too far along.  When the jaw joints and/or muscles have been exposed to radiation treatments, the onset of trismus is very slow.  This is because the muscles are losing their stretching ability over a long period of time, as long as 2 YEARS after the end of radiation.  It is therefore important to exercise these muscles and joints.  Trismus exercises:  Use the stack of tongue depressors measuring the same or a little less than your maximum opening that was recorded at your first dental visit.  Place the stack in your mouth, supporting the other end with your hand.  Allow 30 seconds for muscle stretching.  Rest for a few seconds.  Repeat 3-5 times.  For all radiation patients, this exercise is recommended  in the mornings and evenings unless otherwise instructed.  The exercises should be done for a period of 2 YEARS after the end of radiation.  Maximum jaw opening should be checked routinely on recall dental visits by your general dentist.  Report any changes, soreness, or difficulties encountered when doing the exercises to your dentist.   QUESTIONS?  Call our office during office hours at [phone]. 

## 2021-03-30 ENCOUNTER — Encounter: Payer: Self-pay | Admitting: Physical Therapy

## 2021-03-30 ENCOUNTER — Ambulatory Visit: Payer: Medicaid Other | Attending: Radiation Oncology | Admitting: Physical Therapy

## 2021-03-30 ENCOUNTER — Other Ambulatory Visit: Payer: Self-pay

## 2021-03-30 DIAGNOSIS — R471 Dysarthria and anarthria: Secondary | ICD-10-CM | POA: Diagnosis present

## 2021-03-30 DIAGNOSIS — R1312 Dysphagia, oropharyngeal phase: Secondary | ICD-10-CM | POA: Diagnosis present

## 2021-03-30 DIAGNOSIS — M436 Torticollis: Secondary | ICD-10-CM | POA: Diagnosis present

## 2021-03-30 DIAGNOSIS — C029 Malignant neoplasm of tongue, unspecified: Secondary | ICD-10-CM | POA: Insufficient documentation

## 2021-03-30 DIAGNOSIS — M6281 Muscle weakness (generalized): Secondary | ICD-10-CM | POA: Diagnosis present

## 2021-03-30 DIAGNOSIS — I890 Lymphedema, not elsewhere classified: Secondary | ICD-10-CM | POA: Diagnosis present

## 2021-03-30 DIAGNOSIS — R293 Abnormal posture: Secondary | ICD-10-CM | POA: Insufficient documentation

## 2021-03-30 NOTE — Therapy (Signed)
[place], [place], [address] Phone: [phone]   Fax:  [phone]  Physical Therapy Evaluation  Patient Details  Name: [name] MRN: [id number] Date of Birth: 1976-01-12 Referring Provider (PT): Reita May Date: 03/30/2021   PT End of Session - 03/30/21 1617    Visit Number 1    Number of Visits 12    Date for PT Re-Evaluation 05/30/21    PT Start Time 1200    PT Stop Time 1245    PT Time Calculation (min) 45 min    Activity Tolerance Patient tolerated treatment well    Behavior During Therapy [facility] for tasks assessed/performed           Past Medical History:  Diagnosis Date  . Cancer ([place])    tongue    Past Surgical History:  Procedure Laterality Date  . DIRECT LARYNGOSCOPY N/A 09/05/2020   Procedure: DIRECT LARYNGOSCOPY;  Surgeon: Marcina Millard, MD;  Location: [facility];  Service: ENT;  Laterality: N/A;  . ESOPHAGOSCOPY N/A 09/05/2020   Procedure: ESOPHAGOSCOPY;  Surgeon: Marcina Millard, MD;  Location: [facility];  Service: ENT;  Laterality: N/A;  . FRACTURE SURGERY    . TONGUE BIOPSY N/A 09/05/2020   Procedure: TONGUE BIOPSY;  Surgeon: Marcina Millard, MD;  Location: [facility];  Service: ENT;  Laterality: N/A;    There were no vitals filed for this visit.    Subjective Assessment - 03/30/21 1214    Subjective Pt is here to find out about how he knows how he can get rid of the swelling in his neck. He says it is better than it was, but he still has it.  Pt still has PEG    Pertinent History SCC of tongue Stage IVA, 09/15/20 performed direct laryngoscopy with biopsy with pathology revealing well differentiated invasive squamous cell carcinoma without perineural or lymphovascular invasion, 10/12/20- MRI revealed large mass within the tongue without evidence of crossing midline, tumor bulged into sublingunal region on the L, 10/15/20- tracheostomy, mandibulotomy, partial left  pharyngectomy, total glossectomy, bilateral mRND and ALT free flap,  radiation to tongue/tumor bed and bilateral neck, started on 12/09/20 and complete 01/19/21. PEG and dental extractions performed during surgery.    Patient Stated Goals to get rid of the swelling    Currently in Pain? No/denies              [facility] PT Assessment - 03/30/21 0001      Assessment   Medical Diagnosis SCC of tongue    Referring Provider (PT) Isidore Moos    Onset Date/Surgical Date 10/12/20    Hand Dominance Right      Precautions   Precautions Other (comment)    Precaution Comments at risk for lymphedema due to radiation      Restrictions   Weight Bearing Restrictions No      Balance Screen   Has the patient fallen in the past 6 months No    Has the patient had a decrease in activity level because of a fear of falling?  No    Is the patient reluctant to leave their home because of a fear of falling?  No      Home Environment   Living Environment Private residence    Living Arrangements Alone    Available Help at Discharge Family      Prior Function   Level of Independence Independent    Vocation On disability  Leisure walks every day for about 3 blocks, he feels like his enegy level is almost back to normal      Cognition   Overall Cognitive Status Within Functional Limits for tasks assessed   speech intelligible, but limited due to tongue surgery     Observation/Other Assessments   Observations visible  fullness at neck. Well healed incisions at both side of neck and above stenal notch at site of tracheostomy ( cord?) skin darkened form radiation well healed scar along left thigh from donor site      Functional Tests   Functional tests Sit to Stand      Sit to Stand   Comments 30 sec sit to stand: 10 reps which is poor for his age   pt limited by pain in left leg at donor site from muscle graft for tongue surgery     Posture/Postural Control   Posture/Postural Control Postural limitations     Postural Limitations Forward head;Rounded Shoulders      ROM / Strength   AROM / PROM / Strength AROM      AROM   Overall AROM  Deficits    AROM Assessment Site Cervical;Shoulder    Right/Left Shoulder --   shoulders WFL   Cervical Flexion 55    Cervical Extension 12    Cervical - Right Side Bend 15    Cervical - Left Side Bend 20    Cervical - Right Rotation 30    Cervical - Left Rotation 45      Palpation   Palpation comment firmness to palpation at neck and submental area             LYMPHEDEMA/ONCOLOGY QUESTIONNAIRE - 03/30/21 0001      Type   Cancer Type tongue      Lymphedema Assessments   Lymphedema Assessments Head and Neck      Head and Neck   4 cm superior to sternal notch around neck 42.5 cm    6 cm superior to sternal notch around neck 42.5 cm    8 cm superior to sternal notch around neck 43 cm             [employer] Authorization   Choose one: Rehabilitative  Standardized Assessment or Functional Outcome Tool: See Pain Assessment  Score or Percent Disability:   Body Parts Treated (Select each separately):  1. Cervicothoracic. Overall deficits/functional limitations for body part selected: severe 2. General strength: mild to moderate  Objective measurements completed on examination: See above findings.       [facility] Adult PT Treatment/Exercise - 03/30/21 0001      Self-Care   Self-Care Other Self-Care Comments    Other Self-Care Comments  made pt foam chip pack to wear in submental area.                  PT Education - 03/30/21 1616    Education Details wear chip back 2-3 hours per day. Assess skin for softness upon removal    Person(s) Educated Patient    Methods Explanation    Comprehension Verbalized understanding               PT Long Term Goals - 03/30/21 1255      PT LONG TERM GOAL #1   Title Pt will  increase cervical extension to 30 degrees so that he wil be able to look up easier during ADLs    Baseline 12  degrees on 03/30/2021    Time 8  Period Weeks    Status New      PT LONG TERM GOAL #2   Title Pt will be independent  in a home exericse for neck ROM and stretching    Time 8    Period Weeks    Status New      PT LONG TERM GOAL #3   Title Pt will decrease neck circumference at 6 cm proximal to sternal notch by 2 cm    Baseline 42.5 on 6/1/ 2022    Time 8    Period Weeks    Status New      PT LONG TERM GOAL #4   Title Pt wil be able to manage his neck lymphedema at home with self MLD/Flexitouch, compression, and exercise    Baseline no knowledge    Time 8    Period Weeks    Status New                  Plan - 03/30/21 1618    Clinical Impression Statement Pt comes back to PT after completing radiation with increased lymphostatic fibrosis in neck and submental region and decreased neck ROM.  He also has decreased sit to stand in 30 seconds from prior to treatment. He will benefit from PT to teach self care for lymphedema and complete decongestive therapy to decrease lymphedema in neck.  Expect he will need a Flexitouch compression pump and compression garment ( jovi pac?)  for long term managment at home Demographics sent today to assess coverage.    Personal Factors and Comorbidities Comorbidity 3+    Comorbidities extensive surgery and radiation to neck, PEG still intact, surgery on left leg from donor site    Examination-Activity Limitations Transfers   swallowing, looking overhead ,driving due to decreased neck movement   Stability/Clinical Decision Making Stable/Uncomplicated    Clinical Decision Making Low    Rehab Potential Good    PT Frequency 2x / week    PT Duration 8 weeks    PT Treatment/Interventions ADLs/Self Care Home Management;Patient/family education;Therapeutic exercise;Therapeutic activities;Orthotic Fit/Training;Manual lymph drainage;Manual techniques;Scar mobilization;Taping;Passive range of motion;Joint Manipulations    PT Next Visit Plan begin  complete decongestive therapy, follow up on flexitouch and compression garments. teach LE exercise and reinforce neck stretching    PT Home Exercise Plan head and neck ROM exercises    Consulted and Agree with Plan of Care Patient           Patient will benefit from skilled therapeutic intervention in order to improve the following deficits and impairments:  Postural dysfunction,Decreased knowledge of precautions,Increased fascial restricitons,Decreased strength,Decreased knowledge of use of DME,Decreased activity tolerance,Decreased range of motion,Decreased scar mobility,Increased edema,Increased muscle spasms,Decreased endurance  Visit Diagnosis: Abnormal posture - Plan: PT plan of care cert/re-cert  Lymphedema, not elsewhere classified - Plan: PT plan of care cert/re-cert  Muscle weakness (generalized) - Plan: PT plan of care cert/re-cert  Neck stiffness - Plan: PT plan of care cert/re-cert     Problem List Patient Active Problem List   Diagnosis Date Noted  . Normocytic normochromic anemia 03/02/2021  . Cancer of anterior two-thirds of tongue ([place]) 11/23/2020  . HTN (hypertension) 10/15/2020  . Influenza vaccine refused 09/21/2020  . Hypertension 09/21/2020  . Daily consumption of alcohol 09/13/2020  . 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 Tongue carcinoma ([place])    Donato Heinz. Owens Shark, PT  Norwood Levo 03/30/2021, 4:32 PM  [place]  [place], [place], [address] Phone: [phone]   Fax:  [phone]  Name: [name] MRN: [id number] Date of Birth: 04-25-1976

## 2021-04-01 ENCOUNTER — Other Ambulatory Visit: Payer: Self-pay

## 2021-04-01 ENCOUNTER — Ambulatory Visit: Payer: Medicaid Other

## 2021-04-01 DIAGNOSIS — R1312 Dysphagia, oropharyngeal phase: Secondary | ICD-10-CM

## 2021-04-01 DIAGNOSIS — R471 Dysarthria and anarthria: Secondary | ICD-10-CM

## 2021-04-01 DIAGNOSIS — R293 Abnormal posture: Secondary | ICD-10-CM | POA: Diagnosis not present

## 2021-04-01 NOTE — Therapy (Signed)
[facility] [address] [place], [place], [address] Phone: [phone]   Fax:  [phone]  Speech Language Pathology Treatment  Patient Details  Name: [name] MRN: [id number] Date of Birth: [birth year] Referring Provider (SLP): Eppie Gibson, MD   Encounter Date: 04/01/2021   End of Session - 04/01/21 1421    Visit Number 6    Number of Visits 10    Date for SLP Re-Evaluation 05/03/21    SLP Start Time 1320    SLP Stop Time  4982    SLP Time Calculation (min) 27 min    Activity Tolerance Patient tolerated treatment well           Past Medical History:  Diagnosis Date  . Cancer ([place])    tongue    Past Surgical History:  Procedure Laterality Date  . DIRECT LARYNGOSCOPY N/A 09/05/2020   Procedure: DIRECT LARYNGOSCOPY;  Surgeon: Marcina Millard, MD;  Location: [facility];  Service: ENT;  Laterality: N/A;  . ESOPHAGOSCOPY N/A 09/05/2020   Procedure: ESOPHAGOSCOPY;  Surgeon: Marcina Millard, MD;  Location: [facility];  Service: ENT;  Laterality: N/A;  . FRACTURE SURGERY    . TONGUE BIOPSY N/A 09/05/2020   Procedure: TONGUE BIOPSY;  Surgeon: Marcina Millard, MD;  Location: [facility];  Service: ENT;  Laterality: N/A;    There were no vitals filed for this visit.   Subjective Assessment - 04/01/21 1326    Subjective Pt has done approx 25-30 reps of effortful swallow daily.    Currently in Pain? No/denies                 ADULT SLP TREATMENT - 04/01/21 1327      General Information   Behavior/Cognition Alert;Cooperative;Pleasant mood      Treatment Provided   Treatment provided Dysphagia      Dysphagia Treatment   Temperature Spikes Noted No    Respiratory Status Room air    Oral Cavity - Dentition Missing dentition    Patient observed directly with PO's No   due to >30 minutes after oral care   Pharyngeal Phase Signs & Symptoms --   throat clears throughout session   Other treatment/comments  [name] enters today clearing his throat and continued to do so throughout session, suggesting inability to clear secretions from pharyngeal cavities. Pt reports completing 25-30 reps effortful swallow approx 3 days/week. SLP reiterated that was less than 50 reps/day SLP recommended. SLP reiterated rationale for 50/day throughout session; Pt voiced understanding of rationale. Pt told SLP he has only had water since the last ST visit. SLP heavily stressed a need for pt to give himself meticulous oral care <30 minutes before inititation of water boluses. At home pt uses toothpaste and scrubs gums and then uses baking soda after 20 minutes for further cleaning. SLP recommended brushing tongue, roof, and inside cheeks as well as gums, and using a toothbrush with the baking soda scrubbing the same structures. SLP had pt complete 5 sets of 5 efforftul swallows. Pt first set was 17 seconds to complete 5 swallows, and last set took 47 seconds for 5 swallows. SLP gave pt 45 seconds rest between sets, and 60 seconds rest for the last set. SLP explained these results for pt stressing that if pt completes HEP as directed, number of seconds to complete 5 swallows will not incr as dramatically due to incr'd strength of swallow musculature. Pt voiced understanding.      Assessment /  Recommendations / Plan   Plan Continue with current plan of care      Dysphagia Recommendations   Diet recommendations NPO      Progression Toward Goals   Progression toward goals Not progressing toward goals (comment)      General Information   Reason PO's not observed --   easily fatigued; suboptimal completion of HEP           SLP Education - 04/01/21 1415    Education Details see daily note for details    Person(s) Educated Patient    Methods Explanation    Comprehension Verbalized understanding            SLP Short Term Goals - 02/03/21 1647      SLP SHORT TERM GOAL #1   Title Pt will complete HEP with rare min A  over two sessions    Baseline total A; 12-30-20    Period --   sessions, for all STGs   Status Achieved      SLP SHORT TERM GOAL #2   Title Pt will tell SLP 3 overt s/s aspiration PNA with modified independence    Baseline none given    Status Achieved      SLP SHORT TERM GOAL #3   Title Pt will tell SLP rationale for HEP completion    Baseline total A    Status Achieved            SLP Long Term Goals - 04/01/21 1427      SLP LONG TERM GOAL #1   Title Pt will complete HEP with modified independence over 2 visits    Baseline total A;  02/18/21    Period --   sessions, for all LTGs   Status Achieved      SLP LONG TERM GOAL #2   Title pt will perform safety precautions from FEES with PO ice chips with modified independence 2 sessions    Baseline total A    Status Not Met   pt using water - not ice chips     SLP LONG TERM GOAL #3   Title Pt will tell SLP when to decr frequency of HEP    Baseline not provided yet    Time 1    Status Deferred      SLP LONG TERM GOAL #4   Title pt will complete 4 sets of 5 effortful swallows in less than 25 seconds    Time 4    Period Weeks    Status New            Plan - 04/01/21 1422    Clinical Impression Statement [name] presents today cont PEG tube dependent following significant oral and phayrngeal surgery in December requiring him to be NPO currently. Pt has effortful swallow exercise from [facility], per FEES report. No overt s/sx aspiration PNA reported or observed today. [name] reports he has incr'd # reps of effortful swallow over previous session - but still only 25-30/day (SLP told him to complete at least 50/day after previous two sessions). Data suggest that pt's swallowing is threatened by muscle fibrosis that could develop after rad/chemorad is completed. Muscle disuse atrophy is also possible as pt has been PEG dependent many weeks. Therefore, SLP cont to stress that pt complete the indivdualized exercise program he rec'd at  [facility] to mitigate/eliminate pt's muscle fibrosis following and as a result of, pt's rad tx. [name] cont to drink water at home despite SLP recommendation for ice chips.  Fortunately pt has no overt s/sx aspiration PNA to date. SLP again highly stressed pt's need to perform oral care < 30 minutes prior to performing POs at home. See "skiled intervention" for more details of today's session. SLP considering d/c if pt does not follow SLP recommendations of number of reps/day, therefor skilled speech therapy MAYcont to be beneficial to the pt in order to regularly assess pt's safety with POs and/or need for instrumental swallow assessment, as well as to assess accurate completion of swallowing HEP. SLP continues to think pt has not shown progress to warrant a follow up objective swallow eval yet. SLP highly stressed pt work hard at optimal practice regimen prior to his follow up with ENT and ST at [facility] on 04-25-21.    Speech Therapy Frequency --   approx x2/month   Duration --   4 weeks   Treatment/Interventions Aspiration precaution training;Pharyngeal strengthening exercises;Diet toleration management by SLP;Trials of upgraded texture/liquids;Patient/family education;Internal/external aids;SLP instruction and feedback;Compensatory strategies;Other (comment)    Potential to Achieve Goals Fair    Potential Considerations Severity of impairments;Previous level of function    SLP Home Exercise Plan pt rec'd at [facility] after FEES 11-29-20    Consulted and Agree with Plan of Care Patient           Patient will benefit from skilled therapeutic intervention in order to improve the following deficits and impairments:   Dysphagia, oropharyngeal phase  Severe dysarthria    Problem List Patient Active Problem List   Diagnosis Date Noted  . Normocytic normochromic anemia 03/02/2021  . Cancer of anterior two-thirds of tongue ([place]) 11/23/2020  . HTN (hypertension) 10/15/2020  . Influenza vaccine refused  09/21/2020  . Hypertension 09/21/2020  . Daily consumption of alcohol 09/13/2020  . 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 Tongue carcinoma ([place])     [place] ,[place], [place]  04/01/2021, 2:32 PM  [facility] [address] [place], [place], [address] Phone: [phone]   Fax:  [phone]   Name: [name] MRN: [id number] Date of Birth: November 01, 1975

## 2021-04-05 ENCOUNTER — Ambulatory Visit: Payer: Medicaid Other

## 2021-04-05 ENCOUNTER — Other Ambulatory Visit: Payer: Self-pay

## 2021-04-05 DIAGNOSIS — M6281 Muscle weakness (generalized): Secondary | ICD-10-CM

## 2021-04-05 DIAGNOSIS — R293 Abnormal posture: Secondary | ICD-10-CM

## 2021-04-05 DIAGNOSIS — I890 Lymphedema, not elsewhere classified: Secondary | ICD-10-CM

## 2021-04-05 DIAGNOSIS — C029 Malignant neoplasm of tongue, unspecified: Secondary | ICD-10-CM

## 2021-04-05 DIAGNOSIS — M436 Torticollis: Secondary | ICD-10-CM

## 2021-04-05 NOTE — Therapy (Signed)
[place], [place], [address] Phone: [phone]   Fax:  [phone]  Physical Therapy Treatment  Patient Details  Name: [name] MRN: [id number] Date of Birth: 1976/05/26 Referring Provider (PT): Reita May Date: 04/05/2021   PT End of Session - 04/05/21 1012    Visit Number 2    Number of Visits 12    Date for PT Re-Evaluation 05/30/21    PT Start Time 0909    PT Stop Time 1003    PT Time Calculation (min) 54 min    Activity Tolerance Patient tolerated treatment well    Behavior During Therapy [facility] for tasks assessed/performed           Past Medical History:  Diagnosis Date  . Cancer ([place])    tongue    Past Surgical History:  Procedure Laterality Date  . DIRECT LARYNGOSCOPY N/A 09/05/2020   Procedure: DIRECT LARYNGOSCOPY;  Surgeon: Marcina Millard, MD;  Location: [facility];  Service: ENT;  Laterality: N/A;  . ESOPHAGOSCOPY N/A 09/05/2020   Procedure: ESOPHAGOSCOPY;  Surgeon: Marcina Millard, MD;  Location: [facility];  Service: ENT;  Laterality: N/A;  . FRACTURE SURGERY    . TONGUE BIOPSY N/A 09/05/2020   Procedure: TONGUE BIOPSY;  Surgeon: Marcina Millard, MD;  Location: [facility];  Service: ENT;  Laterality: N/A;    There were no vitals filed for this visit.   Subjective Assessment - 04/05/21 0910    Subjective Pt reports wearing the chip pack and feels like his neck has reduced some. He is 100% covered by [employer] and they are mailing it to him, and will then come out and demo for him   Pertinent History SCC of tongue Stage IVA, 09/15/20 performed direct laryngoscopy with biopsy with pathology revealing well differentiated invasive squamous cell carcinoma without perineural or lymphovascular invasion, 10/12/20- MRI revealed large mass within the tongue without evidence of crossing midline, tumor bulged into sublingunal region on the L, 10/15/20- tracheostomy, mandibulotomy,  partial left pharyngectomy, total glossectomy, bilateral mRND and ALT free flap,  radiation to tongue/tumor bed and bilateral neck, started on 12/09/20 and complete 01/19/21. PEG and dental extractions performed during surgery.    Currently in Pain? No/denies    Multiple Pain Sites No                             [facility] Adult PT Treatment/Exercise - 04/05/21 0001      Posture/Postural Control   Posture Comments Educated in proper sitting/standing posture and head alignment      Neck Exercises: Seated   Postural Training cervical retractions    Other Seated Exercise bilateral rot and SB x 5    Other Seated Exercise scapular retraction/shoulder rolls x 10      Shoulder Exercises: Standing   External Rotation Strengthening;Both;10 reps    Theraband Level (Shoulder External Rotation) Level 2 (Red)    Extension Strengthening;Both;10 reps    Theraband Level (Shoulder Extension) Level 2 (Red)    Retraction Strengthening;Both;10 reps    Theraband Level (Shoulder Retraction) Level 2 (Red)      Manual Therapy   Soft tissue mobilization scar mobilization to all incisions    Manual Lymphatic Drainage (MLD) Collarbones, deep breathing, bilateral armpits, side of chest, front of chest, collarbones, Back of neck into river, side of neck into river and front of neck into river, center neck downward,  jaws and face reversing all steps to decrease neck swelling                  PT Education - 04/05/21 1005    Education Details Pt was educated in sitting cervical retraction, AROM bilateral rotation and bilateral sidebending being sure to use good posture. He was also educated in standing  BilateralScap retraction, shoulder extension and ER with red band x 10 reps    Person(s) Educated Patient    Methods Demonstration;Handout    Comprehension Returned demonstration               PT Long Term Goals - 03/30/21 1255      PT LONG TERM GOAL #1   Title Pt will  increase  cervical extension to 30 degrees so that he wil be able to look up easier during ADLs    Baseline 12 degrees on 03/30/2021    Time 8    Period Weeks    Status New      PT LONG TERM GOAL #2   Title Pt will be independent  in a home exericse for neck ROM and stretching    Time 8    Period Weeks    Status New      PT LONG TERM GOAL #3   Title Pt will decrease neck circumference at 6 cm proximal to sternal notch by 2 cm    Baseline 42.5 on 6/1/ 2022    Time 8    Period Weeks    Status New      PT LONG TERM GOAL #4   Title Pt wil be able to manage his neck lymphedema at home with self MLD/Flexitouch, compression, and exercise    Baseline no knowledge    Time 8    Period Weeks    Status New                 Plan - 04/05/21 1013    Clinical Impression Statement Pt has been contacted by [employer] and it is 100% covered.  They will send it to him and then come out and do a demo.  therapy consisted of education in proper sitting/standing posture, cervical ROM using good posture, Scar mobilization, and Cervical MLD with explanation to pt, and education in postural theraband execises.  He did have slight softening of cervical region after MLD. Right side of incision restricted greater than left.  ROM very limited in both directions. Pt used good form with exercises with only occasional VC's for proper posture    Personal Factors and Comorbidities Comorbidity 3+    Comorbidities extensive surgery and radiation to neck, PEG still intact, surgery on left leg from donor site    Examination-Activity Limitations Transfers    Stability/Clinical Decision Making Stable/Uncomplicated    Rehab Potential Good    PT Frequency 2x / week    PT Duration 8 weeks    PT Treatment/Interventions ADLs/Self Care Home Management;Patient/family education;Therapeutic exercise;Therapeutic activities;Orthotic Fit/Training;Manual lymph drainage;Manual techniques;Scar mobilization;Taping;Passive range of  motion;Joint Manipulations    PT Next Visit Plan Continue scar mobs, Cervical ROM, and add BB, start teaching MLD , continue postural education.  Add mini squats to table for leg strength    PT Home Exercise Plan head and neck ROM exercises, red theraband for standing scap. retraction, shoulder ext and bilateral ER    Consulted and Agree with Plan of Care Patient           Patient will benefit from  skilled therapeutic intervention in order to improve the following deficits and impairments:  Postural dysfunction,Decreased knowledge of precautions,Increased fascial restricitons,Decreased strength,Decreased knowledge of use of DME,Decreased activity tolerance,Decreased range of motion,Decreased scar mobility,Increased edema,Increased muscle spasms,Decreased endurance  Visit Diagnosis: Abnormal posture  Lymphedema, not elsewhere classified  Muscle weakness (generalized)  Neck stiffness  Squamous cell carcinoma of tongue ([place])     Problem List Patient Active Problem List   Diagnosis Date Noted  . Normocytic normochromic anemia 03/02/2021  . Cancer of anterior two-thirds of tongue ([place]) 11/23/2020  . HTN (hypertension) 10/15/2020  . Influenza vaccine refused 09/21/2020  . Hypertension 09/21/2020  . Daily consumption of alcohol 09/13/2020  . 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 Tongue carcinoma [facility])     Claris Pong 04/05/2021, 10:22 AM  [place], [place], [address] Phone: [phone]   Fax:  [phone]  Name: SHAMOND SKELTON MRN: [id number] Date of Birth: November 06, 1975  Cheral Almas, PT 04/05/21 10:24 AM

## 2021-04-05 NOTE — Patient Instructions (Signed)
Access Code: [email or web address] URL: [email or web address] Date: 04/05/2021 Prepared by: Cheral Almas  Exercises Scapular Retraction with Resistance - 1 x daily - 4 x weekly - 1-2 sets - 10 reps Scapular Retraction with Resistance Advanced - 1 x daily - 4 x weekly - 1-2 sets - 10 reps Shoulder External Rotation and Scapular Retraction with Resistance - 1 x daily - 4 x weekly - 1-2 sets - 10 reps Seated Cervical Rotation AROM - 2 x daily - 7 x weekly - 1 sets - 10 reps - 5 hold Seated Cervical Sidebending AROM - 2 x daily - 7 x weekly - 1 sets - 10 reps - 5 hold Standing Cervical Retraction - 2 x daily - 7 x weekly - 1 sets - 10 reps - 3 hold Seated Scapular Retraction - 2-3 x daily - 7 x weekly - 1 sets - 10 reps

## 2021-04-11 ENCOUNTER — Other Ambulatory Visit: Payer: Self-pay | Admitting: Hematology and Oncology

## 2021-04-11 ENCOUNTER — Other Ambulatory Visit: Payer: Self-pay

## 2021-04-11 ENCOUNTER — Encounter: Payer: Self-pay | Admitting: Hematology and Oncology

## 2021-04-11 DIAGNOSIS — D509 Iron deficiency anemia, unspecified: Secondary | ICD-10-CM

## 2021-04-11 MED ORDER — FERROUS SULFATE 325 (65 FE) MG PO TBEC: 325.0000 mg | DELAYED_RELEASE_TABLET | Freq: Two times a day (BID) | ORAL | 2 refills | Status: AC

## 2021-04-11 NOTE — Progress Notes (Signed)
I called aunt per recommendations in the note, but she suggested that I call the patient. I called the patient and explained that he would also need a colonoscopy given ongoing IDA, he agreed to the referral. Referral placed  [name]

## 2021-04-18 ENCOUNTER — Other Ambulatory Visit: Payer: Self-pay

## 2021-04-18 ENCOUNTER — Ambulatory Visit: Payer: Medicaid Other

## 2021-04-18 ENCOUNTER — Ambulatory Visit (HOSPITAL_COMMUNITY)
Admission: RE | Admit: 2021-04-18 | Discharge: 2021-04-18 | Disposition: A | Discharge status: Home / Self Care | Payer: Medicaid Other | Source: Ambulatory Visit | Attending: Radiation Oncology | Admitting: Radiation Oncology

## 2021-04-18 DIAGNOSIS — C029 Malignant neoplasm of tongue, unspecified: Secondary | ICD-10-CM

## 2021-04-18 DIAGNOSIS — R293 Abnormal posture: Secondary | ICD-10-CM

## 2021-04-18 DIAGNOSIS — M6281 Muscle weakness (generalized): Secondary | ICD-10-CM

## 2021-04-18 DIAGNOSIS — M436 Torticollis: Secondary | ICD-10-CM

## 2021-04-18 DIAGNOSIS — I890 Lymphedema, not elsewhere classified: Secondary | ICD-10-CM

## 2021-04-18 IMAGING — CT CT NECK W/ CM
4 of 5 series · 13 of 33 positions shown, 15 images · IV contrast (omnipaque)
Comparison: [DATE] preoperative study

CLINICAL DATA: Squamous cell carcinoma tongue post surgery and
radiation

EXAM:
CT NECK WITH CONTRAST
TECHNIQUE: Multidetector CT imaging of the neck was performed using the
standard protocol following the bolus administration of intravenous
contrast.
CONTRAST:  75mL OMNIPAQUE IOHEXOL 300 MG/ML  SOLN

[Series 9: axial bone · axial · 0.46mm/px · z∈[+1504,+1656]mm · 3 of 152 slices shown, 4 images]
[im 38/152  soft-tissue]
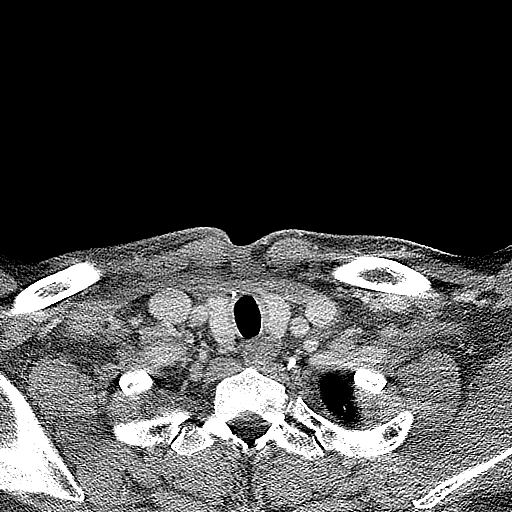
[im 38/152  bone]
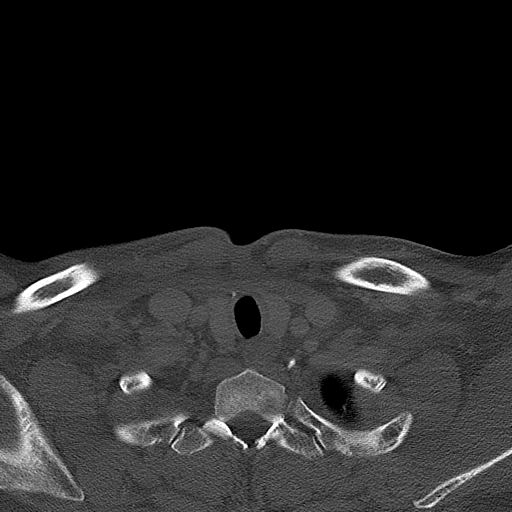
[im 76/152  bone]
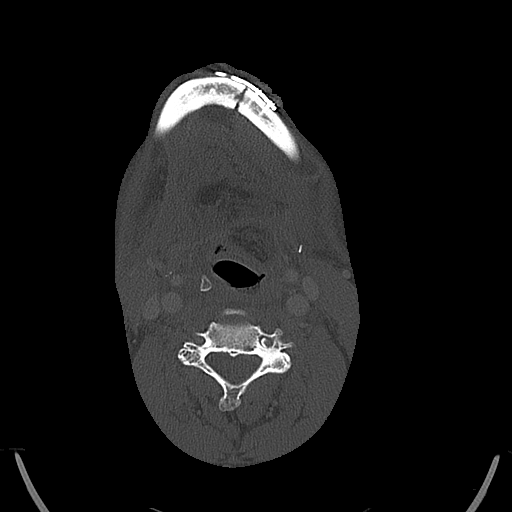
[im 114/152  bone]
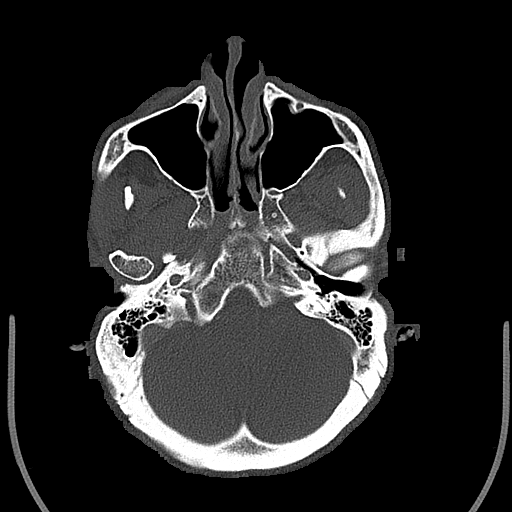

[Series 10: orthogonal (person_name) · axial · 0.35mm/px · z∈[+1502,+1578]mm · 2 of 152 slices shown]
[im 38/152  bone]
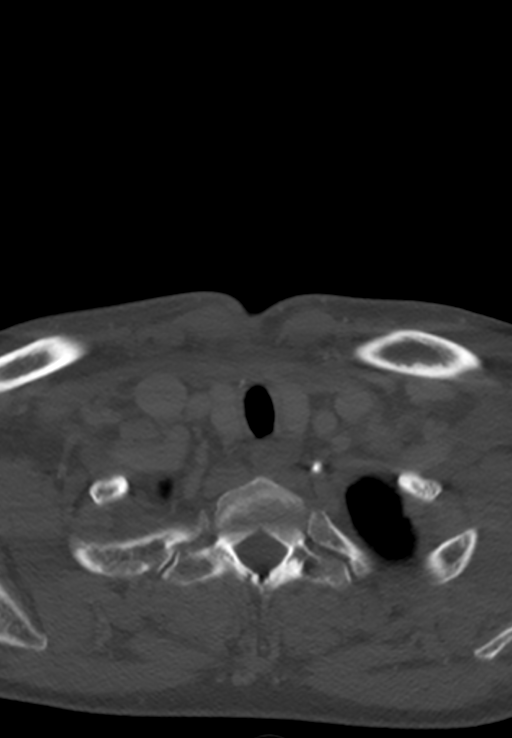
[im 76/152  bone]
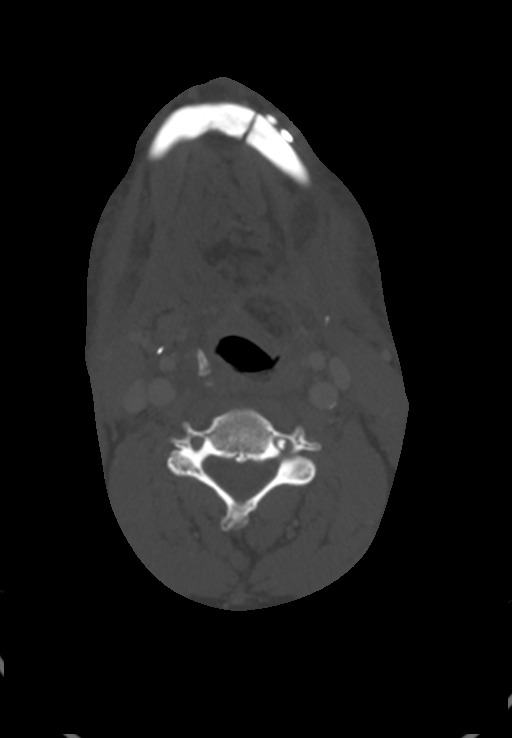

[Series 11: cor neck · coronal · 0.37mm/px · 3 of 123 slices shown]
[im 25/123  bone]
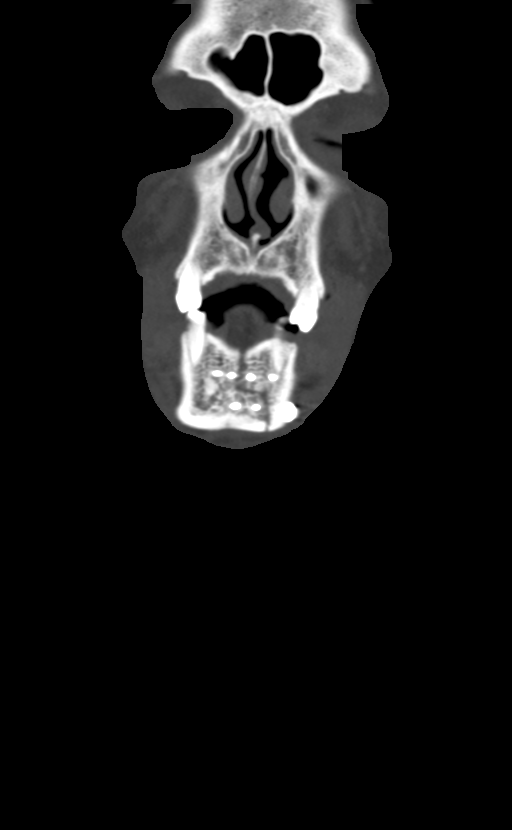
[im 49/123  bone]
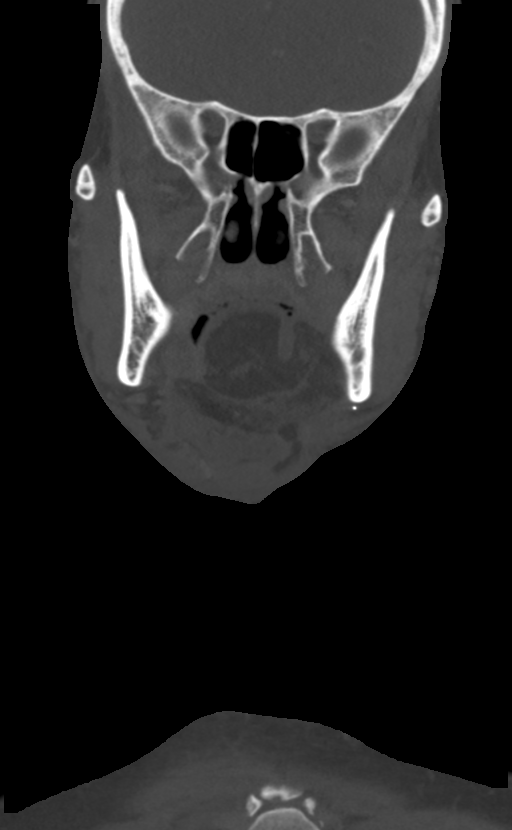
[im 74/123  bone]
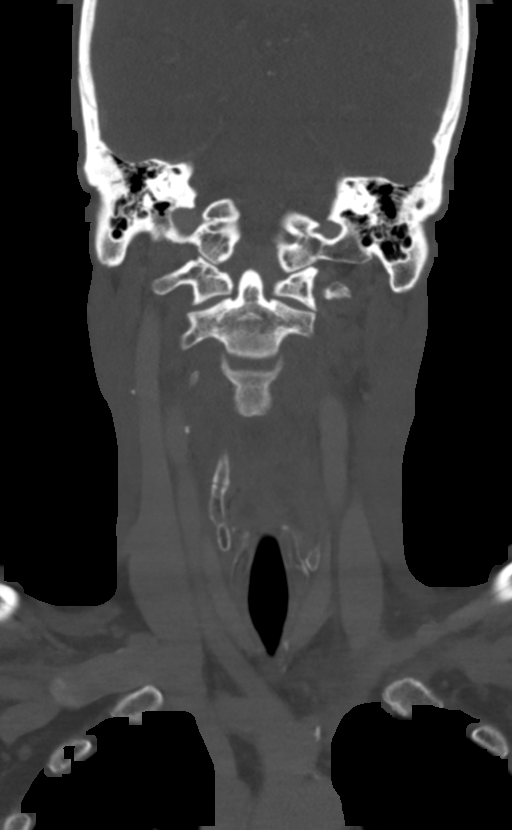

[Series 12: sag neck · sagittal · 0.49mm/px · 5 of 89 slices shown, 6 images]
[im 30/89  bone]
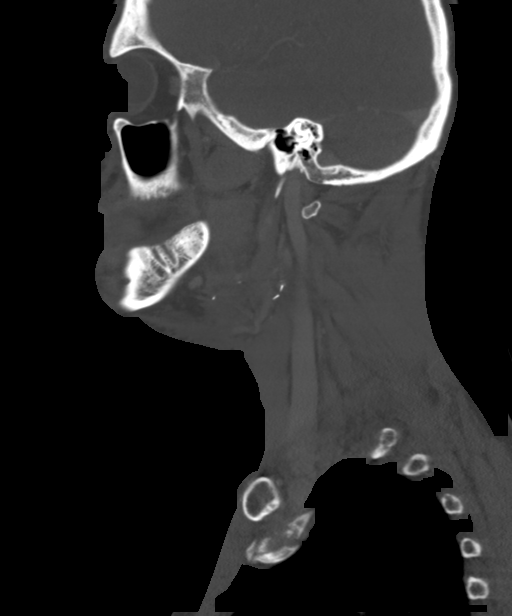
[im 37/89  bone]
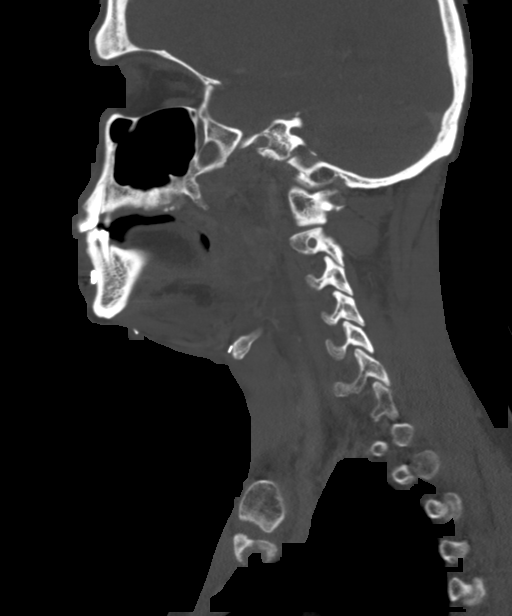
[im 45/89  soft-tissue]
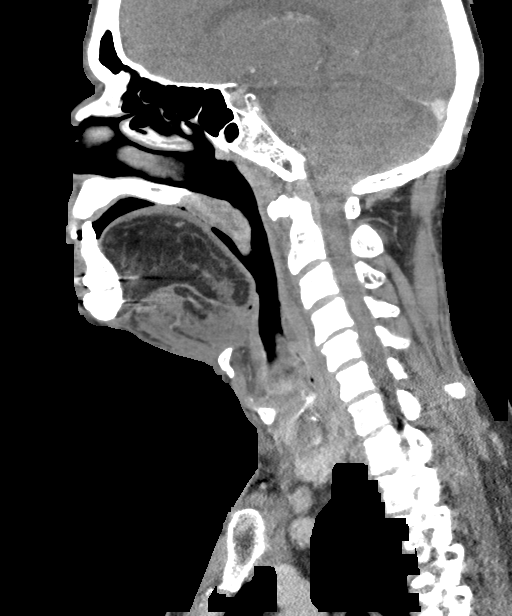
[im 45/89  bone]
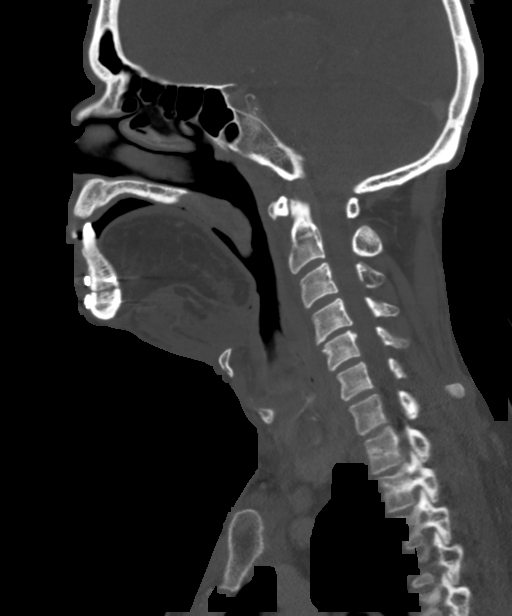
[im 52/89  bone]
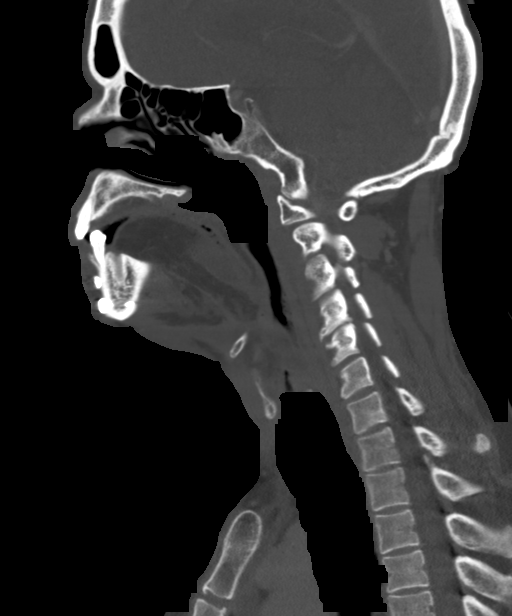
[im 59/89  bone]
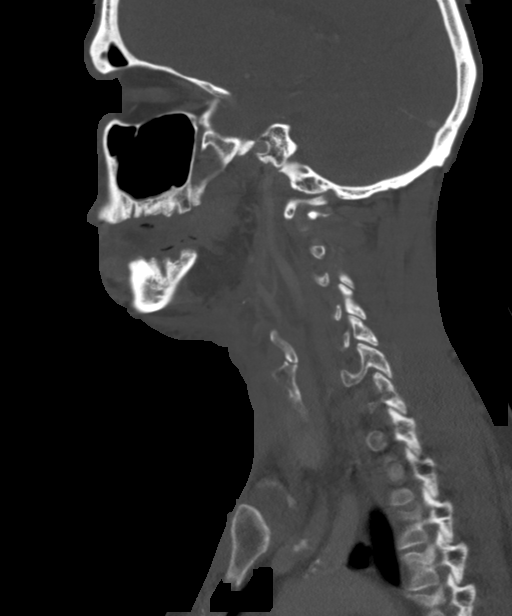

[13 of 33 positions shown; findings below may reference images not displayed]

FINDINGS: Pharynx and larynx: Postoperative changes cholecystectomy with flap
reconstruction. Nodular enhancement measuring approximately 8 mm
along the superior margin of the graft (series 7, image 57;a series
11, image 35; and series 12, image 41). There are post radiation
changes to the pharynx and surrounding neck with soft tissue
thickening/edema.

Salivary glands: Post radiation changes parotid glands.
Submandibular glands appear to be resected.

Thyroid: Normal.

Lymph nodes: Post bilateral neck dissection. No new enlarged or
abnormal density nodes.

Vascular: Major neck vessels are patent. Minimal calcified plaque at
the ICA origins.

Limited intracranial: No abnormal enhancement.

Visualized orbits: Unremarkable.

Mastoids and visualized paranasal sinuses: No significant
opacification.

Skeleton: Postoperative changes of mandible with plate and screw
fixation. Mild degenerative changes of the cervical spine.

Upper chest: Included lung apices are clear. Refer to separately
dictated dedicated chest imaging.

Other: None.
IMPRESSION: Interval post-operative and post-radiation changes. There is
subcentimeter nodular enhancing tissue along the superior margin of
the graft (see image numbers above and annotation on PACS). This is
indeterminate with no comparison available but amenable to sampling
as indicated.

## 2021-04-18 IMAGING — CT CT CHEST W/ CM
2 of 5 series · 15 of 36 positions shown, 18 images · IV contrast (OMNIPAQUE)
Comparison: Chest CT [DATE]

CLINICAL DATA: History of head neck cancer, assess treatment
response.

EXAM:
CT CHEST WITH CONTRAST
TECHNIQUE: Multidetector CT imaging of the chest was performed during
intravenous contrast administration.
CONTRAST:  75mL OMNIPAQUE IOHEXOL 300 MG/ML  SOLN

[Series 7: thins · axial · 0.65mm/px · z∈[+1216,+1511]mm · 12 of 663 slices shown, 15 images]
[im 37/663  mediastinal]
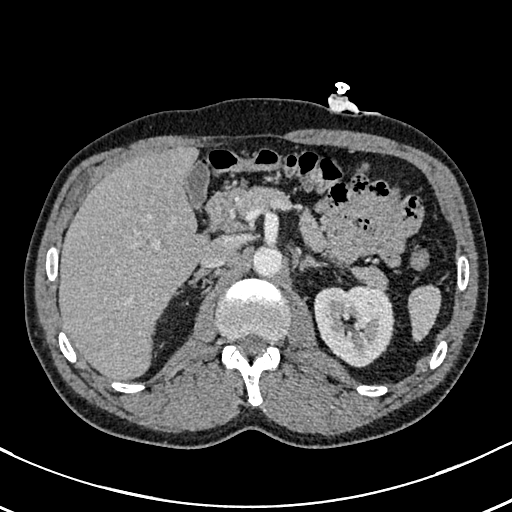
[im 37/663  lung]
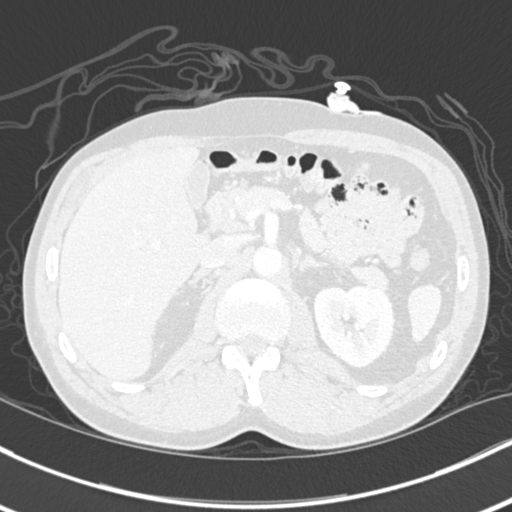
[im 111/663  lung]
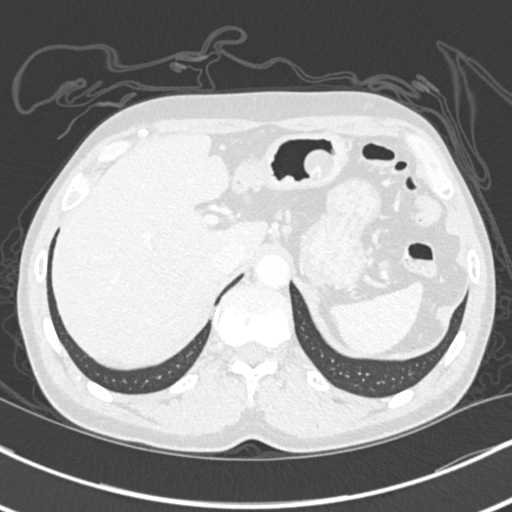
[im 148/663  lung]
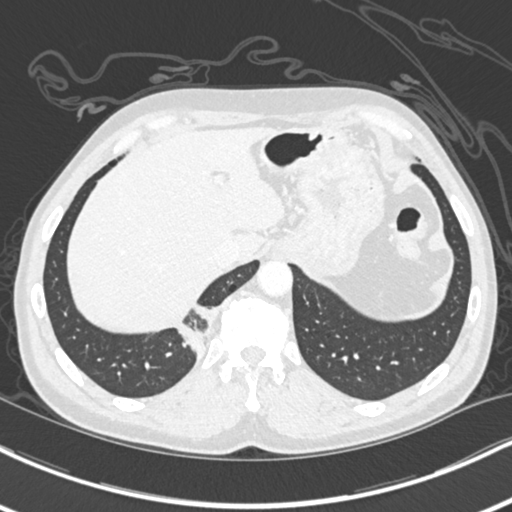
[im 184/663  lung]
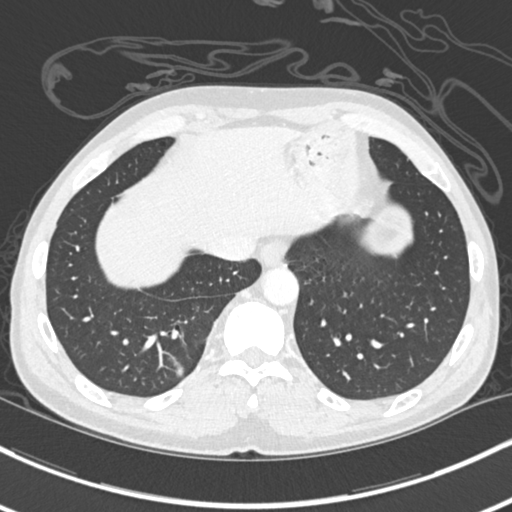
[im 258/663  mediastinal]
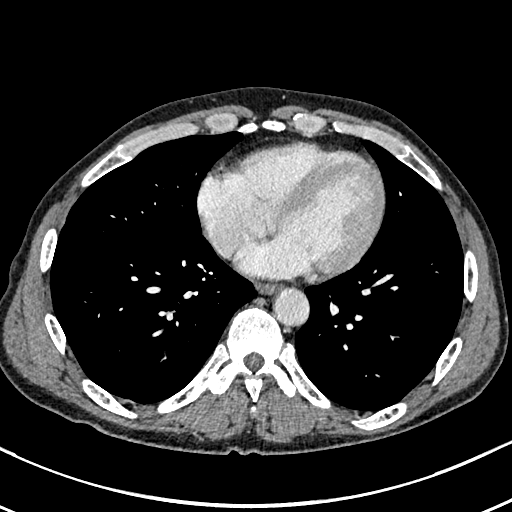
[im 258/663  lung]
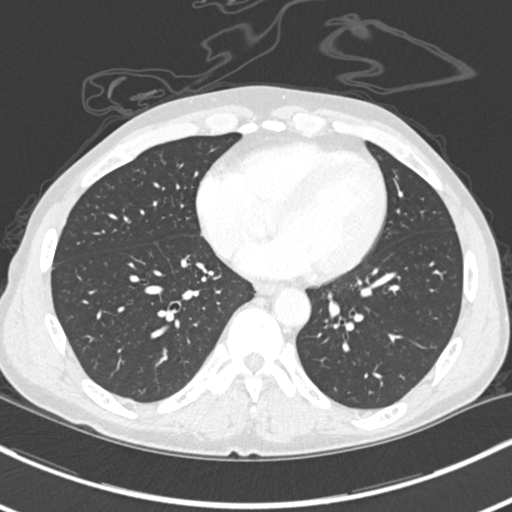
[im 295/663  lung]
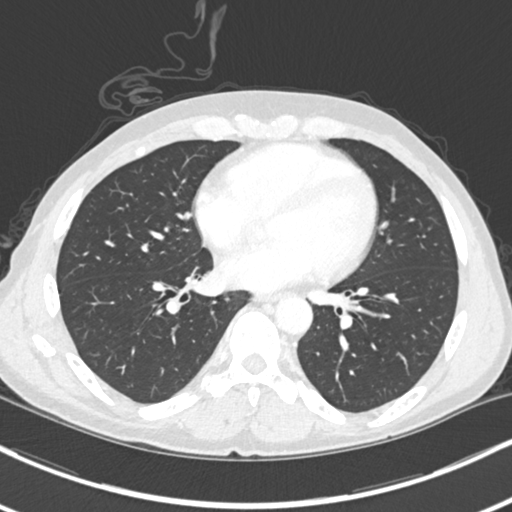
[im 368/663  lung]
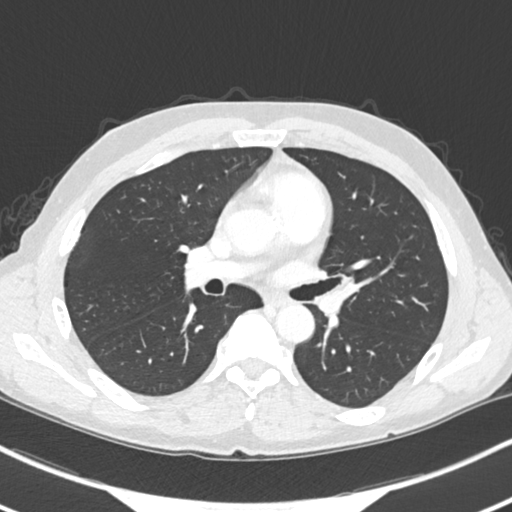
[im 405/663  lung]
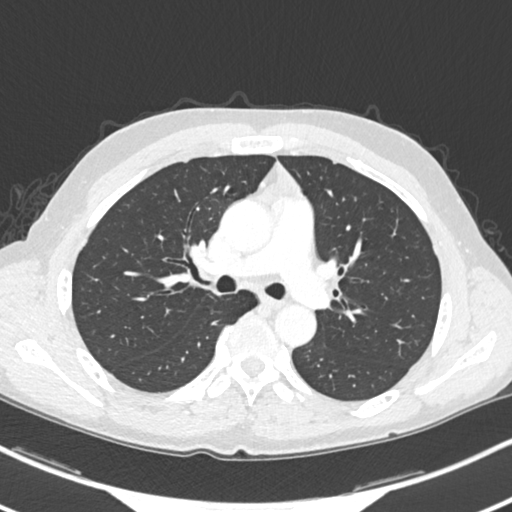
[im 479/663  mediastinal]
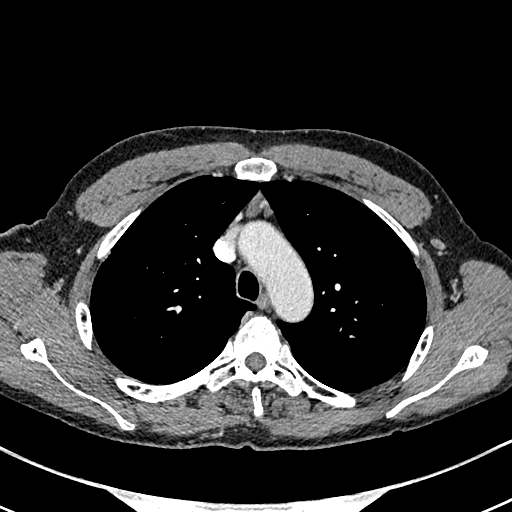
[im 479/663  lung]
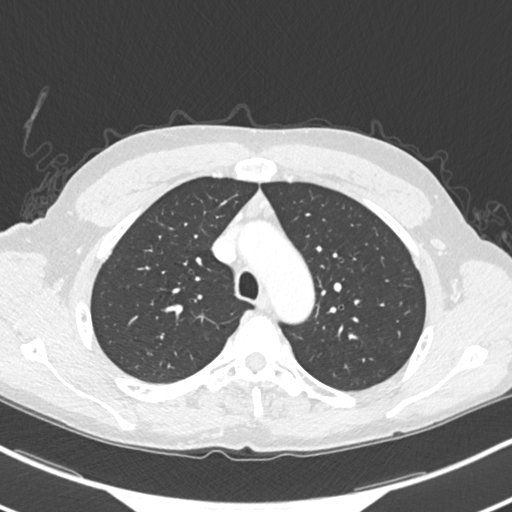
[im 515/663  lung]
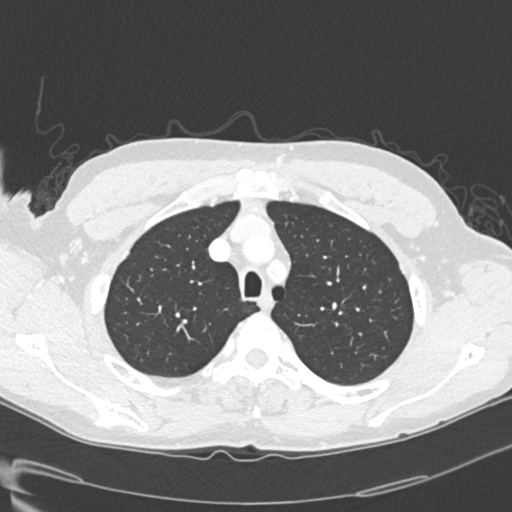
[im 552/663  lung]
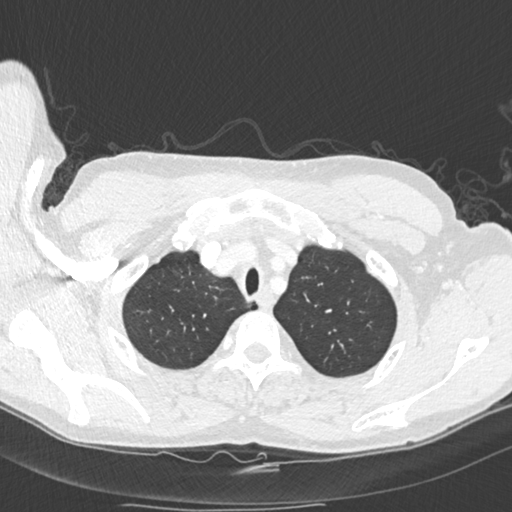
[im 626/663  lung]
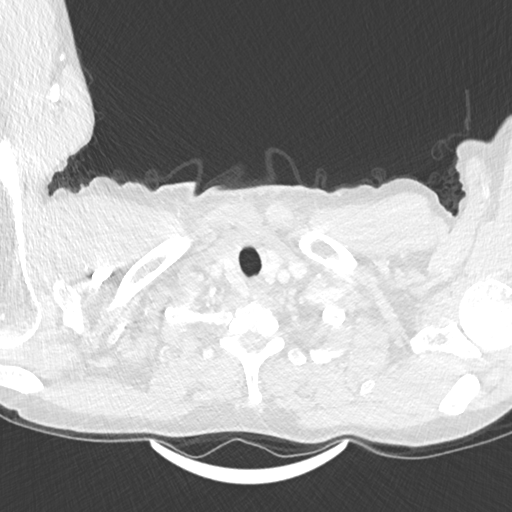

[Series 8: coronal · coronal · 0.63mm/px · 3 of 124 slices shown]
[im 25/124  lung]
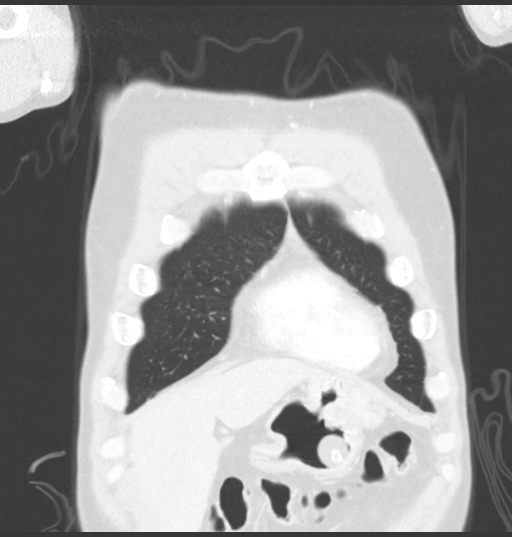
[im 50/124  lung]
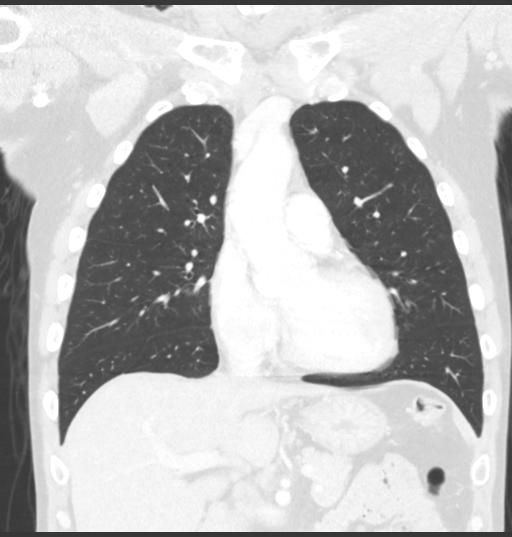
[im 74/124  lung]
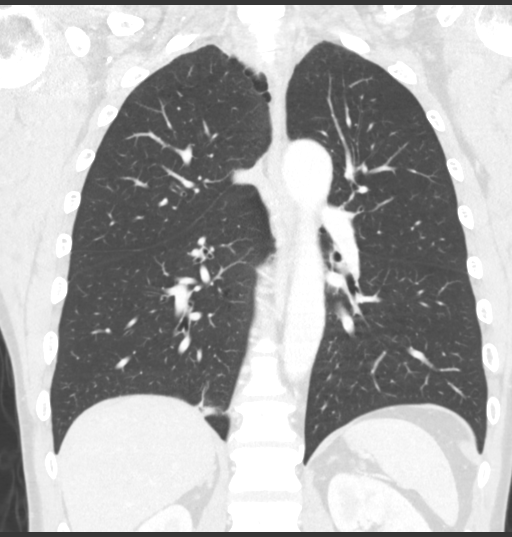

[15 of 36 positions shown; findings below may reference images not displayed]

FINDINGS: Cardiovascular: Aortic atherosclerosis without aneurysmal dilation.
No central pulmonary embolus. Coronary artery calcifications.
Calcifications of the aortic annulus. Normal size heart. No
significant pericardial effusion/thickening.

Mediastinum/Nodes: Increased size of a mixed fat and soft tissue
density in the anterior mediastinum for instance on image 62/6 which
is favored represent thymic hyperplasia. No discrete thyroid
nodularity. No pathologically enlarged mediastinal, hilar or
axillary lymph nodes. The trachea and esophagus are grossly
unremarkable.

Lungs/Pleura: Within the paramedian dependent aspect of the right
lower lobe there is a small consolidative process within interposed
cystic space on image 128/2 and mild associated bronchial and
bronchiolar wall thickening in this area, favored to represent
sequela of aspiration. No suspicious pulmonary nodules. Mild
paraseptal emphysema. Similar mild pleuroparenchymal scarring. No
pleural effusion. No pneumothorax.

Upper Abdomen: Exophytic left upper pole renal cyst measuring
cm. Partially visualized left anterior protocol percutaneous
gastrostomy tube. No acute abnormality.

Musculoskeletal: Multilevel degenerative changes spine. No
aggressive lytic or blastic lesion of bone.
IMPRESSION: 1. No definite evidence of metastatic disease in the chest.
2. Increased size of a mixed fat and soft tissue density in the
anterior mediastinum, favored to represent thymic hyperplasia.
Attention on follow-up.
3. Small consolidative process within with mild associated bronchial
and bronchiolar wall thickening, favored to represent sequela of
aspiration.
4. Emphysema and aortic atherosclerosis.

Aortic Atherosclerosis ([8F]-[8F]) and Emphysema ([8F]-[8F]).

## 2021-04-18 MED ORDER — IOHEXOL 300 MG/ML  SOLN
75.0000 mL | Freq: Once | INTRAMUSCULAR | Status: AC | PRN
  Administered 2021-04-18: 75 mL via INTRAVENOUS

## 2021-04-18 NOTE — Therapy (Signed)
[place], [place], [address] Phone: [phone]   Fax:  [phone]  Physical Therapy Treatment  Patient Details  Name: [name] MRN: [id number] Date of Birth: [birth year] Referring Provider (PT): Reita May Date: 04/18/2021   PT End of Session - 04/18/21 1158     Visit Number 3    Number of Visits 12    PT Start Time 1105    PT Stop Time 9470    PT Time Calculation (min) 48 min    Activity Tolerance Patient tolerated treatment well    Behavior During Therapy [facility] for tasks assessed/performed             Past Medical History:  Diagnosis Date   Cancer ([place])    tongue    Past Surgical History:  Procedure Laterality Date   DIRECT LARYNGOSCOPY N/A 09/05/2020   Procedure: DIRECT LARYNGOSCOPY;  Surgeon: Marcina Millard, MD;  Location: [place];  Service: ENT;  Laterality: N/A;   ESOPHAGOSCOPY N/A 09/05/2020   Procedure: ESOPHAGOSCOPY;  Surgeon: Marcina Millard, MD;  Location: [place];  Service: ENT;  Laterality: N/A;   FRACTURE SURGERY     TONGUE BIOPSY N/A 09/05/2020   Procedure: TONGUE BIOPSY;  Surgeon: Marcina Millard, MD;  Location: [place];  Service: ENT;  Laterality: N/A;    There were no vitals filed for this visit.   Subjective Assessment - 04/18/21 1107     Subjective Pt reports compliance with neck ROM exs, and chip pack that he has worn 3 days a week while sleeping.    Pertinent History SCC of tongue Stage IVA, 09/15/20 performed direct laryngoscopy with biopsy with pathology revealing well differentiated invasive squamous cell carcinoma without perineural or lymphovascular invasion, 10/12/20- MRI revealed large mass within the tongue without evidence of crossing midline, tumor bulged into sublingunal region on the L, 10/15/20- tracheostomy, mandibulotomy, partial left pharyngectomy, total glossectomy, bilateral mRND and ALT free flap,  radiation to tongue/tumor bed  and bilateral neck, started on 12/09/20 and complete 01/19/21. PEG and dental extractions performed during surgery.    Patient Stated Goals to get rid of the swelling    Currently in Pain? No/denies                               [facility] Adult PT Treatment/Exercise - 04/18/21 0001       Neck Exercises: Seated   Other Seated Exercise bilateral rotation x 5, BB x 10      Manual Therapy   Edema Management Pt was measured for Jovi pak with extendend chin strap and periauricular neck pad secondary to edema especially behind right ear. Size large   Manual Lymphatic Drainage (MLD) Therapist demonstrated MLD and had pt perform in sitting so he could see.Collarbones, deep breathing, bilateral armpits, side of chest, front of chest, collarbones Back of neck into river, side of neck into river and front of neck into river, center neck downward, jaws and face reversing all steps to decrease neck swelling                    PT Education - 04/18/21 1157     Education Details Pt was educated in sitting cervical back bending x 10.  He was educated in cervical MLD and practiced all steps.    Person(s) Educated Patient    Methods Demonstration;Explanation;Verbal cues;Tactile cues;Handout  Comprehension Returned demonstration;Need further instruction;Tactile cues required;Verbal cues required                 PT Long Term Goals - 03/30/21 1255       PT LONG TERM GOAL #1   Title Pt will  increase cervical extension to 30 degrees so that he wil be able to look up easier during ADLs    Baseline 12 degrees on 03/30/2021    Time 8    Period Weeks    Status New      PT LONG TERM GOAL #2   Title Pt will be independent  in a home exericse for neck ROM and stretching    Time 8    Period Weeks    Status New      PT LONG TERM GOAL #3   Title Pt will decrease neck circumference at 6 cm proximal to sternal notch by 2 cm    Baseline 42.5 on 6/1/ 2022    Time 8    Period  Weeks    Status New      PT LONG TERM GOAL #4   Title Pt wil be able to manage his neck lymphedema at home with self MLD/Flexitouch, compression, and exercise    Baseline no knowledge    Time 8    Period Weeks    Status New                   Plan - 04/18/21 1158     Clinical Impression Statement Pt was measured today for Jovi pak.  He does have some swelling/fibrosis behind his ear and the extended chin strap and Peri auricular ear pad was ordered with [employer].  Pt was instructed in self MLD while sitting and observing me.  He did quite well overall but did require VC's and TC's.  He had only been wearing the chip pack 3 nights per week but was advised to wear on a daily basis.  He may have gotten confused with the theraband exs. which he was instructed to do 3xs/week. He experienced good softening at the completion of performing MLD.    Personal Factors and Comorbidities Comorbidity 3+    Comorbidities extensive surgery and radiation to neck, PEG still intact, surgery on left leg from donor site    Examination-Activity Limitations Transfers    Stability/Clinical Decision Making Stable/Uncomplicated    Rehab Potential Good    PT Frequency 2x / week    PT Duration 8 weeks    PT Next Visit Plan Continue scar mobs, Cervical ROM, and  review slef MLD , continue postural education.  Add mini squats to table for leg strength    PT Home Exercise Plan head and neck ROM exercises, red theraband for standing scap. retraction, shoulder ext and bilateral ER, neck MLD    Recommended Other Services garment ordered today    Consulted and Agree with Plan of Care Patient             Patient will benefit from skilled therapeutic intervention in order to improve the following deficits and impairments:  Postural dysfunction, Decreased knowledge of precautions, Increased fascial restricitons, Decreased strength, Decreased knowledge of use of DME, Decreased activity tolerance, Decreased range of  motion, Decreased scar mobility, Increased edema, Increased muscle spasms, Decreased endurance  Visit Diagnosis: Abnormal posture  Lymphedema, not elsewhere classified  Muscle weakness (generalized)  Neck stiffness  Squamous cell carcinoma of tongue ([place])     Problem List Patient  Active Problem List   Diagnosis Date Noted   Normocytic normochromic anemia 03/02/2021   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

## 2021-04-18 NOTE — Patient Instructions (Signed)
Access Code: [email or web address] URL: [email or web address] Date: 04/18/2021 Prepared by: Cheral Almas  Exercises Seated Cervical Extension AROM - 1 x daily - 7 x weekly - 1 sets - 10 reps - 5-10 hold  Pt was also given written instructions with illustrations for neck MLD

## 2021-04-19 ENCOUNTER — Inpatient Hospital Stay: Payer: Medicaid Other | Attending: Hematology and Oncology | Admitting: Nutrition

## 2021-04-19 ENCOUNTER — Encounter: Payer: Self-pay | Admitting: Radiation Oncology

## 2021-04-19 ENCOUNTER — Ambulatory Visit
Admission: RE | Admit: 2021-04-19 | Discharge: 2021-04-19 | Disposition: A | Discharge status: Home / Self Care | Payer: Medicaid Other | Source: Ambulatory Visit | Attending: Radiation Oncology | Admitting: Radiation Oncology

## 2021-04-19 VITALS — BP 146/100 | HR 89 | Temp 96.8°F | Resp 18 | Ht 69.0 in | Wt 143.5 lb

## 2021-04-19 DIAGNOSIS — Z85810 Personal history of malignant neoplasm of tongue: Secondary | ICD-10-CM | POA: Insufficient documentation

## 2021-04-19 DIAGNOSIS — Z923 Personal history of irradiation: Secondary | ICD-10-CM | POA: Diagnosis not present

## 2021-04-19 DIAGNOSIS — C023 Malignant neoplasm of anterior two-thirds of tongue, part unspecified: Secondary | ICD-10-CM

## 2021-04-19 DIAGNOSIS — C029 Malignant neoplasm of tongue, unspecified: Secondary | ICD-10-CM

## 2021-04-19 NOTE — Progress Notes (Signed)
Nutrition follow-up completed with patient status post treatment for tongue cancer. Weight decreased and was documented as 143.8 pounds down from 149.4 pounds May 9. Patient denies oral intake of any kind.  States he is not eating or drinking by mouth. Reports using approximately 7 cartons of Osmolite 1.5 via feeding tube daily.  He uses 60 mL free water flushes before and after bolus feedings 4 times a day.  He is using 1 additional bottle of water.  Patient denies issues with feeding tube.  Estimated nutrition needs: 2500-2800 cal, 100-120 g protein, 2.8 L fluid.  Nutrition diagnosis: Unintended weight loss continues.  Intervention: Consistently give 8 cartons Osmolite 1.5 via low-profile gastrostomy tube, 2 cartons 4 times a day with 60 mL free water before and after bolus feeding. Give an additional 240 mL free water flushes 4 times daily between feedings. Continue swallowing exercises per speech. Continue n.p.o. status until approved to advance diet.  8 cartons Osmolite 1.5+ free water flushes provides 2840 cal, 119 g protein, 2968 mL free water.  Monitoring, evaluation, goals: Patient will tolerate adequate calories and protein via feeding tube to minimize weight loss and promote healing.  Next visit: To be scheduled.  **Disclaimer: This note was dictated with voice recognition software. Similar sounding words can inadvertently be transcribed and this note may contain transcription errors which may not have been corrected upon publication of note.**

## 2021-04-19 NOTE — Progress Notes (Signed)
[name] presents today for follow-up of radiation to his tongue completed on 01/19/2021, and to review CT scan results from 04/18/2021  Pain issues, if any: Patient denies pain Using a feeding tube?: Yes, Osmolite 1.5, 8 cartons per day. Weight changes, if any:  Wt Readings from Last 3 Encounters:  04/19/21 143 lb 8 oz (65.1 kg)  03/18/21 149 lb (67.6 kg)  03/07/21 149 lb 6.4 oz (67.8 kg)   Swallowing issues, if any: at times Smoking or chewing tobacco? no Using fluoride trays daily? N/A--did have F/U with Dr. Sandi Mariscal on 03/18/2021 Last ENT visit was on: Not since diagnosis Other notable issues, if any: Elevated blood pressure, patient states he is under a lot of stress at this time. Patient not currently on blood pressure medications. Encouraged patient to keep check of blood pressure and follow up with PCP if remains high. Patient voiced understanding.    Vitals:   04/19/21 1525  BP: (!) 146/100  Pulse: 89  Resp: 18  Temp: (!) 96.8 F (36 C)  SpO2: 100%

## 2021-04-20 ENCOUNTER — Ambulatory Visit: Payer: Medicaid Other | Admitting: Physical Therapy

## 2021-04-21 ENCOUNTER — Ambulatory Visit: Payer: Medicaid Other

## 2021-04-21 ENCOUNTER — Encounter: Payer: Self-pay | Admitting: Nurse Practitioner

## 2021-04-22 ENCOUNTER — Encounter: Payer: Self-pay | Admitting: Radiation Oncology

## 2021-04-22 ENCOUNTER — Ambulatory Visit: Payer: Medicaid Other

## 2021-04-22 NOTE — Progress Notes (Addendum)
Radiation Oncology         [phone] ________________________________  Name: [name] MRN: [id number]  Date: 02/02/2021  DOB: 1976-09-20  Follow-Up Visit Note  Outpatient  CC: Pcp, No  [name], *  Diagnosis and Prior Radiotherapy:    ICD-10-CM   1. Squamous cell cancer of tongue (HCC)  C02.9 Ambulatory referral to Physical Therapy    CT Chest W Contrast    CT Soft Tissue Neck W Contrast  2. Cancer of anterior two-thirds of tongue ([place])  C02.3     CHIEF COMPLAINT: Here for follow-up and surveillance of tongue cancer  Narrative:   [name] presents today for follow-up of radiation to his tongue completed on 01/19/2021, and to review CT scan results from 04/18/2021  Pain issues, if any: Patient denies pain Using a feeding tube?: Yes, Osmolite 1.5, 8 cartons per day. Weight changes, if any:  Wt Readings from Last 3 Encounters:  04/19/21 143 lb 8 oz (65.1 kg)  03/18/21 149 lb (67.6 kg)  03/07/21 149 lb 6.4 oz (67.8 kg)   Swallowing issues, if any: at times Smoking or chewing tobacco? no Using fluoride trays daily? N/A--did have F/U with [name] on 03/18/2021 Last ENT visit was on: Not since diagnosis Other notable issues, if any: Elevated blood pressure, patient states he is under a lot of stress at this time. Patient not currently on blood pressure medications. Encouraged patient to keep check of blood pressure and follow up with PCP if remains high. Patient voiced understanding.    ALLERGIES:  has no allergies on file.  Meds: Current Outpatient Medications  Medication Sig Dispense Refill   ferrous sulfate 325 (65 FE) MG EC tablet Take 1 tablet (325 mg total) by mouth 2 (two) times daily with a meal. 60 tablet 2   ibuprofen (ADVIL) 800 MG tablet Take 1 tablet (800 mg total) by mouth every 8 (eight) hours as needed. Take with food. 60 tablet 0   chlorthalidone (HYGROTON) 25 MG tablet Take 1 tablet (25 mg total) by mouth daily. (Patient not  taking: No sig reported) 90 tablet 3   Cyanocobalamin (B-12) 1000 MCG SUBL Place 1,000 mcg under the tongue daily. (Patient not taking: Reported on 02/02/2021) 30 tablet 2   ferrous sulfate 325 (65 FE) MG tablet TAKE 1 TABLET BY MOUTH 2 TIMES DAILY WITH A MEAL (Patient not taking: Reported on 02/02/2021) 60 tablet 2   lidocaine (XYLOCAINE) 2 % solution Patient: Mix 1part 2% viscous lidocaine, 1part H20. Swish & swallow 108mL of diluted mixture, 89min before meals and at bedtime, up to QID (Patient not taking: No sig reported) 200 mL 3   Nutritional Supplements (FEEDING SUPPLEMENT, OSMOLITE 1.5 CAL,) LIQD Increase Osmolite 1.5 to 8 cartons daily with 60 mL free water before and after each bolus feeding. Feed via low profile gastrostomy tube. Provides 100% estimated needs. 2840 kcal, 119 gm pro. 1896 mL 0   oxyCODONE (OXY IR/ROXICODONE) 5 MG immediate release tablet Take 5 mg by mouth 2 (two) times daily as needed. (Patient not taking: No sig reported)     No current facility-administered medications for this encounter.    Physical Findings: The patient is in no acute distress. Patient is alert and oriented. Today's Vitals   04/19/21 1525  BP: (!) 146/100  Pulse: 89  Resp: 18  Temp: (!) 96.8 F (36 C)  TempSrc: Temporal  SpO2: 100%  Weight: 143 lb 8 oz (65.1 kg)  Height: 5\' 9"  (1.753  m)  PainSc: 0-No pain   Body mass index is 21.19 kg/m.  HEENT:No thrush.  No visible tumor on reconstructed tongue or rest of oral cavity. There is some nodular tissue in the posterior right oral cavity at the edge of reconstructed  tongue likely related to reconstruction/surgery. Neck: no palpable adenopathy; + treatment related neck edema Skin: skin intact and dry over neck     Lab Findings: Lab Results  Component Value Date   WBC 4.8 01/12/2021   HGB 10.0 (L) 01/12/2021   HCT 32.2 (L) 01/12/2021   MCV 80.3 01/12/2021   PLT 286 01/12/2021    Radiographic Findings: I personally reviewed his CT scan  images.  No evidence of definite metastatic disease in the chest.  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  Neck notable for postoperative and postradiation changes.  Nodular enhancement at the superior /right margin of the graft is nonspecific  Impression/Plan:   Overall he seems to be doing well with no obvious evidence of metastatic or local recurrence of disease.  He will follow-up in the near future with otolaryngology and we will PowerShare his images to them at [facility].  Thyroid function: Check annually Lab Results  Component Value Date   TSH 0.985 12/14/2020    Nutrition : Weight is slightly decreased.  He is using a feeding tube Wt Readings from Last 3 Encounters:  04/19/21 143 lb 8 oz (65.1 kg)  04/19/21 143 lb 12.8 oz (65.2 kg)  03/18/21 149 lb (67.6 kg)   Dysphagia: Follow-up with speech-language pathology in July and continue exercises as instructed  Applauded on not smoking.  Patient will be reviewed at tumor board. I will see him back in 8 months, and he will closely follow with med/onc and ENT in the interim.  On date of service, in total, I spent 25 minutes on this encounter. Patient was seen in person.  _____________________________________   Alex Gibson, MD

## 2021-04-26 ENCOUNTER — Telehealth: Payer: Self-pay | Admitting: *Deleted

## 2021-04-26 NOTE — Telephone Encounter (Signed)
CALLED PATIENT TO INFORM OF 8 MONTH FU WITH DR. Isidore Moos ON 12-20-21 @ 2 PM, SPOKE WITH PATIENT AND HE IS AWARE OF THIS APPT.

## 2021-04-27 ENCOUNTER — Encounter (HOSPITAL_COMMUNITY): Payer: Medicaid Other | Admitting: Dentistry

## 2021-04-28 ENCOUNTER — Other Ambulatory Visit: Payer: Self-pay

## 2021-04-28 ENCOUNTER — Ambulatory Visit: Payer: Medicaid Other

## 2021-04-28 DIAGNOSIS — R293 Abnormal posture: Secondary | ICD-10-CM | POA: Diagnosis not present

## 2021-04-28 DIAGNOSIS — I890 Lymphedema, not elsewhere classified: Secondary | ICD-10-CM

## 2021-04-28 DIAGNOSIS — M436 Torticollis: Secondary | ICD-10-CM

## 2021-04-28 DIAGNOSIS — C029 Malignant neoplasm of tongue, unspecified: Secondary | ICD-10-CM

## 2021-04-28 DIAGNOSIS — M6281 Muscle weakness (generalized): Secondary | ICD-10-CM

## 2021-04-28 NOTE — Therapy (Signed)
[place], [place], [address] Phone: [phone]   Fax:  [phone]  Physical Therapy Treatment  Patient Details  Name: [name] MRN: [id number] Date of Birth: [birth year] Referring Provider (PT): Reita May Date: 04/28/2021   PT End of Session - 04/28/21 1349     Visit Number 4    Number of Visits 12    Date for PT Re-Evaluation 05/30/21    PT Start Time 1301    PT Stop Time 0962    PT Time Calculation (min) 53 min    Activity Tolerance Patient tolerated treatment well    Behavior During Therapy [facility] for tasks assessed/performed             Past Medical History:  Diagnosis Date   Cancer ([place])    tongue    Past Surgical History:  Procedure Laterality Date   DIRECT LARYNGOSCOPY N/A 09/05/2020   Procedure: DIRECT LARYNGOSCOPY;  Surgeon: Marcina Millard, MD;  Location: [place];  Service: ENT;  Laterality: N/A;   ESOPHAGOSCOPY N/A 09/05/2020   Procedure: ESOPHAGOSCOPY;  Surgeon: Marcina Millard, MD;  Location: [place];  Service: ENT;  Laterality: N/A;   FRACTURE SURGERY     TONGUE BIOPSY N/A 09/05/2020   Procedure: TONGUE BIOPSY;  Surgeon: Marcina Millard, MD;  Location: [place];  Service: ENT;  Laterality: N/A;    There were no vitals filed for this visit.   Subjective Assessment - 04/28/21 1303     Subjective I am doing better.  I am wearing the chip pack every day for about 2 hours.  Had an appt with Dr. in [place] and he said everything looks fine. Have been doing MLD about 2x's a day.    Pertinent History SCC of tongue Stage IVA, 09/15/20 performed direct laryngoscopy with biopsy with pathology revealing well differentiated invasive squamous cell carcinoma without perineural or lymphovascular invasion, 10/12/20- MRI revealed large mass within the tongue without evidence of crossing midline, tumor bulged into sublingunal region on the L, 10/15/20- tracheostomy,  mandibulotomy, partial left pharyngectomy, total glossectomy, bilateral mRND and ALT free flap,  radiation to tongue/tumor bed and bilateral neck, started on 12/09/20 and complete 01/19/21. PEG and dental extractions performed during surgery.                               [facility] Adult PT Treatment/Exercise - 04/28/21 0001       Knee/Hip Exercises: Standing   Functional Squat 1 set;10 reps   to high table     Shoulder Exercises: Standing   Horizontal ABduction Strengthening;Both;10 reps    Theraband Level (Shoulder Horizontal ABduction) Level 2 (Red)    External Rotation Strengthening;Both;10 reps    Theraband Level (Shoulder External Rotation) Level 2 (Red)    Extension Strengthening;Both;10 reps    Theraband Level (Shoulder Extension) Level 2 (Red)    Retraction Strengthening;Both;10 reps    Theraband Level (Shoulder Retraction) Level 2 (Red)      Manual Therapy   Edema Management pt performed all steps of MLD after PT performance   Soft tissue mobilization scar mobilization to all incisions    Manual Lymphatic Drainage (MLD) Supine with table elevated;Collarbones, deep breathing, bilateral armpits, side of chest, front of chest, collarbones Back of neck into river, side of neck into river and front of neck into river, center neck downward, jaws and face reversing all steps to  decrease neck swelling                         PT Long Term Goals - 03/30/21 1255       PT LONG TERM GOAL #1   Title Pt will  increase cervical extension to 30 degrees so that he wil be able to look up easier during ADLs    Baseline 12 degrees on 03/30/2021    Time 8    Period Weeks    Status New      PT LONG TERM GOAL #2   Title Pt will be independent  in a home exericse for neck ROM and stretching    Time 8    Period Weeks    Status New      PT LONG TERM GOAL #3   Title Pt will decrease neck circumference at 6 cm proximal to sternal notch by 2 cm    Baseline 42.5 on  6/1/ 2022    Time 8    Period Weeks    Status New      PT LONG TERM GOAL #4   Title Pt wil be able to manage his neck lymphedema at home with self MLD/Flexitouch, compression, and exercise    Baseline no knowledge    Time 8    Period Weeks    Status New                   Plan - 04/28/21 1357     Clinical Impression Statement Pts neck was much softer and had better stretch at completion of therapy.  Swelling also looks improved. He has been compliant with MLD at home and theraband exs.  He has not yet received garments and Flexitouch is not yet approved but is awaiting patient assistance form.  He did require occasional VC's and demonstration for correct direction of stretch on neck    Personal Factors and Comorbidities Comorbidity 3+    Comorbidities extensive surgery and radiation to neck, PEG still intact, surgery on left leg from donor site    Stability/Clinical Decision Making Stable/Uncomplicated    Rehab Potential Good    PT Frequency 2x / week    PT Duration 8 weeks    PT Treatment/Interventions ADLs/Self Care Home Management;Patient/family education;Therapeutic exercise;Therapeutic activities;Orthotic Fit/Training;Manual lymph drainage;Manual techniques;Scar mobilization;Taping;Passive range of motion;Joint Manipulations    PT Next Visit Plan Continue scar mobs, Cervical ROM, and  review slef MLD , continue postural education.  Add mini squats to table for leg strength    PT Home Exercise Plan head and neck ROM exercises, red theraband for standing scap. retraction, shoulder ext and bilateral ER, neck MLD    Consulted and Agree with Plan of Care Patient             Patient will benefit from skilled therapeutic intervention in order to improve the following deficits and impairments:  Postural dysfunction, Decreased knowledge of precautions, Increased fascial restricitons, Decreased strength, Decreased knowledge of use of DME, Decreased activity tolerance, Decreased  range of motion, Decreased scar mobility, Increased edema, Increased muscle spasms, Decreased endurance  Visit Diagnosis: Abnormal posture  Lymphedema, not elsewhere classified  Muscle weakness (generalized)  Neck stiffness  Squamous cell carcinoma of tongue (H[facility])     Problem List 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[place]) 11/23/2020   HTN (hypertension) 10/15/2020   Influenza  vaccine refused 09/21/2020   Hypertension 09/21/2020   Daily consumption of alcohol 09/13/2020   Squamous cell cancer of tongue ([place]) 09/13/2020   Tongue carcinoma ([place])     Claris Pong 04/28/2021, 2:01 PM  [place], [place], [address] Phone: [phone]   Fax:  [phone]  Name: [name] MRN: [id number] Date of Birth: 11-10-1975  Cheral Almas, PT 04/28/21 2:03 PM

## 2021-05-03 ENCOUNTER — Ambulatory Visit: Payer: Medicaid Other | Attending: Radiation Oncology

## 2021-05-03 DIAGNOSIS — R293 Abnormal posture: Secondary | ICD-10-CM | POA: Insufficient documentation

## 2021-05-03 DIAGNOSIS — C029 Malignant neoplasm of tongue, unspecified: Secondary | ICD-10-CM | POA: Insufficient documentation

## 2021-05-03 DIAGNOSIS — M436 Torticollis: Secondary | ICD-10-CM | POA: Insufficient documentation

## 2021-05-03 DIAGNOSIS — M6281 Muscle weakness (generalized): Secondary | ICD-10-CM | POA: Insufficient documentation

## 2021-05-03 DIAGNOSIS — I890 Lymphedema, not elsewhere classified: Secondary | ICD-10-CM | POA: Insufficient documentation

## 2021-05-03 DIAGNOSIS — R471 Dysarthria and anarthria: Secondary | ICD-10-CM | POA: Insufficient documentation

## 2021-05-03 DIAGNOSIS — R1312 Dysphagia, oropharyngeal phase: Secondary | ICD-10-CM | POA: Insufficient documentation

## 2021-05-04 ENCOUNTER — Other Ambulatory Visit: Payer: Self-pay

## 2021-05-04 ENCOUNTER — Ambulatory Visit: Payer: Medicaid Other | Attending: Radiation Oncology

## 2021-05-04 DIAGNOSIS — R1312 Dysphagia, oropharyngeal phase: Secondary | ICD-10-CM | POA: Diagnosis present

## 2021-05-04 DIAGNOSIS — R471 Dysarthria and anarthria: Secondary | ICD-10-CM | POA: Diagnosis present

## 2021-05-04 NOTE — Patient Instructions (Addendum)
   When you eat or drink: Clean your mouth out REALLY WELL before you eat or drink anything Take small bites and sips Swallow hard a few times Clear your throat and swallow again Alternating liquids and puree,  When you are done, hock and spit Clean out your mouth REALLY WELL   KEEP DOING HARD SWALLOWS! Do at least 50 hard swallows every day, in sets of 5 Put no more than one minute of rest between sets When you notice the muscles getting tired, do one more set then wait a couple hours and do some more sets  YOU HAVE TO DO THESE AT LEAST [place]!

## 2021-05-05 ENCOUNTER — Ambulatory Visit: Payer: Medicaid Other

## 2021-05-05 ENCOUNTER — Other Ambulatory Visit: Payer: Self-pay

## 2021-05-05 DIAGNOSIS — M436 Torticollis: Secondary | ICD-10-CM

## 2021-05-05 DIAGNOSIS — I890 Lymphedema, not elsewhere classified: Secondary | ICD-10-CM

## 2021-05-05 DIAGNOSIS — R471 Dysarthria and anarthria: Secondary | ICD-10-CM

## 2021-05-05 DIAGNOSIS — R1312 Dysphagia, oropharyngeal phase: Secondary | ICD-10-CM

## 2021-05-05 DIAGNOSIS — M6281 Muscle weakness (generalized): Secondary | ICD-10-CM

## 2021-05-05 DIAGNOSIS — R293 Abnormal posture: Secondary | ICD-10-CM | POA: Diagnosis present

## 2021-05-05 DIAGNOSIS — C029 Malignant neoplasm of tongue, unspecified: Secondary | ICD-10-CM

## 2021-05-05 NOTE — Therapy (Signed)
[facility] [address] [place], [place], [address] Phone: [phone]   Fax:  [phone]  Speech Language Pathology Treatment  Patient Details  Name: [name] MRN: [id number] Date of Birth: 1976-05-02 Referring Provider (SLP): Eppie Gibson, MD   Encounter Date: 05/04/2021   End of Session - 05/05/21 1714     Visit Number 7    Number of Visits 13    Date for SLP Re-Evaluation 07/06/21    SLP Start Time 1320    SLP Stop Time  1400    SLP Time Calculation (min) 40 min    Activity Tolerance Patient tolerated treatment well             Past Medical History:  Diagnosis Date   Cancer ([place])    tongue    Past Surgical History:  Procedure Laterality Date   DIRECT LARYNGOSCOPY N/A 09/05/2020   Procedure: DIRECT LARYNGOSCOPY;  Surgeon: Marcina Millard, MD;  Location: [facility];  Service: ENT;  Laterality: N/A;   ESOPHAGOSCOPY N/A 09/05/2020   Procedure: ESOPHAGOSCOPY;  Surgeon: Marcina Millard, MD;  Location: [facility];  Service: ENT;  Laterality: N/A;   FRACTURE SURGERY     TONGUE BIOPSY N/A 09/05/2020   Procedure: TONGUE BIOPSY;  Surgeon: Marcina Millard, MD;  Location: [facility];  Service: ENT;  Laterality: N/A;    There were no vitals filed for this visit.   Subjective Assessment - 05/05/21 1703     Subjective Pt has completed about 30 reps of effortful swallow daily (unchanged from previous session).    Currently in Pain? Yes    Pain Score 3     Pain Location Leg    Pain Orientation Left    Pain Descriptors / Indicators Aching                   ADULT SLP TREATMENT - 05/05/21 1704       General Information   Behavior/Cognition Alert;Cooperative;Pleasant mood      Treatment Provided   Treatment provided Dysphagia      Dysphagia Treatment   Temperature Spikes Noted No    Respiratory Status Room air    Oral Cavity - Dentition Missing dentition    Treatment Methods Skilled  observation;Compensation strategy training;Therapeutic exercise;Upgraded PO texture trial;Patient/caregiver education    Patient observed directly with PO's No   due to no oral care prior to ST   Other treatment/comments [name] reports he had FEES at [facility] on 04-25-21 but does not recall precautions to tell to SLP today. SLP reviewed pt's FEES with him using report found in [place].SLP told  pt that runny puree and nectar liquids could be used for pleasure feeds with begin with oral care, sit upright, eat slowly, swallow 2-3 times for each bite/sip, alternate solids/liquids, clear throat after last swallow, hock up remaining residual and expectorate, and perform oral care after POs. SLP then worked with pt with effortful swallow in sets of 5.First set 15 seconds for 5, next was 21 seconds, third- 52 seconds (all with 10-15 second breaks between sets). After a 2 minute rest pt produced  next 5 swallows in 34 seconds. Pt was told to do sets of 5 with no more than 60 second breaks between sets. When he feels tired, attempt one more set and then 1-2 hours later attempt to compelte the 50 effortful swallows. If still unable, then wait another 1-2 hours and complete them. SLP encourage d pt to  do more than 50/day using this technique and again explained rationale for this. Pt demonstrated understanding.      Assessment / Recommendations / Plan   Plan Continue with current plan of care      Progression Toward Goals   Progression toward goals Not progressing toward goals (comment)   cont'd suboptimal completion of HEP             SLP Education - 05/05/21 1713     Education Details (see daily note)    Person(s) Educated Patient    Methods Explanation;Demonstration;Verbal cues;Handout    Comprehension Verbalized understanding;Returned demonstration;Verbal cues required              SLP Short Term Goals - 02/03/21 1647       SLP SHORT TERM GOAL #1   Title Pt will complete HEP with  rare min A over two sessions    Baseline total A; 12-30-20    Period --   sessions, for all STGs   Status Achieved      SLP SHORT TERM GOAL #2   Title Pt will tell SLP 3 overt s/s aspiration PNA with modified independence    Baseline none given    Status Achieved      SLP SHORT TERM GOAL #3   Title Pt will tell SLP rationale for HEP completion    Baseline total A    Status Achieved              SLP Long Term Goals - 05/05/21 1718       SLP LONG TERM GOAL #1   Title Pt will complete HEP with modified independence over 2 visits    Baseline total A;  02/18/21    Period --   sessions, for all LTGs   Status Achieved      SLP LONG TERM GOAL #2   Title pt will perform safety precautions from FEES with PO ice chips with modified independence 2 sessions    Baseline total A    Status Not Met   pt using water - not ice chips     SLP LONG TERM GOAL #3   Title Pt will tell SLP when to decr frequency of HEP    Baseline not provided yet    Time 1    Status Deferred      SLP LONG TERM GOAL #4   Title pt will complete 4 sets of 5 effortful swallows in less than 25 seconds    Time 8    Period Weeks    Status Revised              Plan - 05/05/21 1715     Clinical Impression Statement [name] presents today cont PEG tube dependent following significant oral and phayrngeal surgery in December requiring him to be NPO currently. [name] had follow up FEES at [facility] 04-25-21 - see report for details under Care Everywhere. Fortunately pt cont iwth no overt s/sx aspiration PNA to date. SLP again highly stressed pt's need to perform oral care before and after POs at home. See "skiled intervention" for more details of today's session. SLP considering d/c if pt does not follow SLP recommendations of number of reps/day, therefore skilled speech therapy MAYcont to be beneficial to the pt in order to regularly assess pt's safety with POs and/or need for instrumental swallow assessment, as well as  to assess accurate completion of swallowing HEP.    Speech Therapy Frequency --   approx x2/month  Duration 8 weeks    Treatment/Interventions Aspiration precaution training;Pharyngeal strengthening exercises;Diet toleration management by SLP;Trials of upgraded texture/liquids;Patient/family education;Internal/external aids;SLP instruction and feedback;Compensatory strategies;Other (comment)    Potential to Achieve Goals Fair    Potential Considerations Severity of impairments;Previous level of function    SLP Home Exercise Plan pt rec'd at [facility] after FEES 11-29-20    Consulted and Agree with Plan of Care Patient             Patient will benefit from skilled therapeutic intervention in order to improve the following deficits and impairments:   Dysphagia, oropharyngeal phase - Plan: SLP plan of care cert/re-cert    Problem List 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[place]) 11/23/2020   HTN (hypertension) 10/15/2020   Influenza vaccine refused 09/21/2020   Hypertension 09/21/2020   Daily consumption of alcohol 09/13/2020   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 ,[place], [place]  05/05/2021, 5:21 PM  [facility] [address] [place], [place], [address] Phone: [phone]   Fax:  [phone]   Name: [name] MRN: [id number] Date of Birth: Apr 01, 1976

## 2021-05-05 NOTE — Therapy (Signed)
[place], [place], [address] Phone: [phone]   Fax:  [phone]  Physical Therapy Treatment  Patient Details  Name: [name] MRN: [id number] Date of Birth: [birth year] Referring Provider (PT): Reita May Date: 05/05/2021   PT End of Session - 05/05/21 1049     Visit Number 5    Number of Visits 12    Date for PT Re-Evaluation 05/30/21    PT Start Time 1005    PT Stop Time 1057    PT Time Calculation (min) 52 min    Activity Tolerance Patient tolerated treatment well    Behavior During Therapy [facility] for tasks assessed/performed             Past Medical History:  Diagnosis Date   Cancer ([place])    tongue    Past Surgical History:  Procedure Laterality Date   DIRECT LARYNGOSCOPY N/A 09/05/2020   Procedure: DIRECT LARYNGOSCOPY;  Surgeon: Marcina Millard, MD;  Location: [facility];  Service: ENT;  Laterality: N/A;   ESOPHAGOSCOPY N/A 09/05/2020   Procedure: ESOPHAGOSCOPY;  Surgeon: Marcina Millard, MD;  Location: [facility];  Service: ENT;  Laterality: N/A;   FRACTURE SURGERY     TONGUE BIOPSY N/A 09/05/2020   Procedure: TONGUE BIOPSY;  Surgeon: Marcina Millard, MD;  Location: [facility];  Service: ENT;  Laterality: N/A;    There were no vitals filed for this visit.   Subjective Assessment - 05/05/21 1008     Subjective I feel like it is doing better.  I am still wearing the chip pack for 2 hours a day.I have been doing the MLD everytime I get out of the shower.  I helped someone move yesterday and I almost didn't come today because my leg is hurting bad.    Pertinent History SCC of tongue Stage IVA, 09/15/20 performed direct laryngoscopy with biopsy with pathology revealing well differentiated invasive squamous cell carcinoma without perineural or lymphovascular invasion, 10/12/20- MRI revealed large mass within the tongue without evidence of crossing midline, tumor bulged into  sublingunal region on the L, 10/15/20- tracheostomy, mandibulotomy, partial left pharyngectomy, total glossectomy, bilateral mRND and ALT free flap,  radiation to tongue/tumor bed and bilateral neck, started on 12/09/20 and complete 01/19/21. PEG and dental extractions performed during surgery.    Patient Stated Goals to get rid of the swelling    Currently in Pain? Yes    Pain Score 4     Pain Location Leg    Pain Orientation Left    Pain Descriptors / Indicators Aching    Pain Type Surgical pain    Pain Onset Yesterday    Pain Frequency Occasional    Aggravating Factors  helping friend move    Multiple Pain Sites No                   LYMPHEDEMA/ONCOLOGY QUESTIONNAIRE - 05/05/21 0001       Head and Neck   4 cm superior to sternal notch around neck 38.8 cm    6 cm superior to sternal notch around neck 39 cm    8 cm superior to sternal notch around neck 39.1 cm                        [facility] Adult PT Treatment/Exercise - 05/05/21 0001       Manual Therapy   Edema Management pt measured for custom Tribute night  Head and neck and info faxed to [facility]    Manual Lymphatic Drainage (MLD) pt performed in sitting so he could see.Collarbones, deep breathing, bilateral armpits, side of chest, front of chest, collarbones Back of neck into river, side of neck into river and front of neck into river, center neck downward, jaws and face reversing all steps to decrease neck swelling                         PT Long Term Goals - 03/30/21 1255       PT LONG TERM GOAL #1   Title Pt will  increase cervical extension to 30 degrees so that he wil be able to look up easier during ADLs    Baseline 12 degrees on 03/30/2021    Time 8    Period Weeks    Status New      PT LONG TERM GOAL #2   Title Pt will be independent  in a home exericse for neck ROM and stretching    Time 8    Period Weeks    Status New      PT LONG TERM GOAL #3   Title Pt will decrease neck  circumference at 6 cm proximal to sternal notch by 2 cm    Baseline 42.5 on 6/1/ 2022    Time 8    Period Weeks    Status New      PT LONG TERM GOAL #4   Title Pt wil be able to manage his neck lymphedema at home with self MLD/Flexitouch, compression, and exercise    Baseline no knowledge    Time 8    Period Weeks    Status New                   Plan - 05/05/21 1050     Clinical Impression Statement Pt was remeasured with good results todaniques and sequencey, and also measured for Tribute night head and neck and info was faxed to [facility].  We reviewed MLD and pt continues to require VC's for sequence and direction of technigue  and sequence even when looking at the paper.  He is compliant with band exercises    Personal Factors and Comorbidities Comorbidity 3+    Comorbidities extensive surgery and radiation to neck, PEG still intact, surgery on left leg from donor site    Examination-Activity Limitations Transfers    Stability/Clinical Decision Making Stable/Uncomplicated    Rehab Potential Good    PT Frequency 2x / week    PT Duration 8 weeks    PT Treatment/Interventions ADLs/Self Care Home Management;Patient/family education;Therapeutic exercise;Therapeutic activities;Orthotic Fit/Training;Manual lymph drainage;Manual techniques;Scar mobilization;Taping;Passive range of motion;Joint Manipulations    PT Next Visit Plan Continue scar mobs, Cervical ROM, and MLD and review self MLD , continue postural education.  Add mini squats to table for leg strength, assess garments when they come in    PT Home Exercise Plan head and neck ROM exercises, red theraband for standing scap. retraction, shoulder ext and bilateral ER, neck MLD    Recommended Other Services info for tribute faxed to [facility] 05/05/21    Consulted and Agree with Plan of Care Patient             Patient will benefit from skilled therapeutic intervention in order to improve the following deficits and impairments:   Postural dysfunction, Decreased knowledge of precautions, Increased fascial restricitons, Decreased strength, Decreased knowledge of use of DME, Decreased  activity tolerance, Decreased range of motion, Decreased scar mobility, Increased edema, Increased muscle spasms, Decreased endurance  Visit Diagnosis: Abnormal posture  Lymphedema, not elsewhere classified  Muscle weakness (generalized)  Neck stiffness  Squamous cell carcinoma of tongue ([facility])  Dysphagia, oropharyngeal phase  Severe dysarthria     Problem List 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[place]) 11/23/2020   HTN (hypertension) 10/15/2020   Influenza vaccine refused 09/21/2020   Hypertension 09/21/2020   Daily consumption of alcohol 09/13/2020   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

## 2021-05-16 ENCOUNTER — Encounter: Payer: Medicaid Other | Admitting: Nutrition

## 2021-05-16 ENCOUNTER — Other Ambulatory Visit: Payer: Self-pay

## 2021-05-16 ENCOUNTER — Ambulatory Visit: Payer: Medicaid Other

## 2021-05-16 ENCOUNTER — Inpatient Hospital Stay: Payer: Medicaid Other | Attending: Hematology and Oncology | Admitting: Nutrition

## 2021-05-16 DIAGNOSIS — R1312 Dysphagia, oropharyngeal phase: Secondary | ICD-10-CM

## 2021-05-16 DIAGNOSIS — R471 Dysarthria and anarthria: Secondary | ICD-10-CM

## 2021-05-16 NOTE — Progress Notes (Signed)
Nutrition follow-up completed with patient status post treatment for tongue cancer. Patient just finished appointment with speech pathologist who has educated him on how to begin pured foods.  Patient reports that he is supposed to only do pured foods when he is with speech pathologist and not do them at home by himself. Weight improved and documented as 145.6 pounds today increased from 143.8 pounds June 21. Patient is using 8 cartons Osmolite 1.5 via PEG with 60 mL free water flushes before and after bolus feedings 4 times a day.  States he is giving 240 mL of free water 4 times daily between tube feeding. Patient denies nutrition impact symptoms.  8 cartons Osmolite 1.5+ free water flushes provides 2840 cal, 119 g protein, 2968 mL free water.  Estimated nutrition needs: 2500-2800 cal, 100-120 g protein, 2.8 L fluid.  Nutrition diagnosis: Unintended weight loss improved.  Intervention: Continue 8 cartons Osmolite 1.5 with free water flushes as stated above to provide 100% estimated nutrition needs.  Continue n.p.o. until further direction from speech pathologist.  Monitoring, evaluation, goals: Patient will tolerate adequate calories and protein to minimize weight loss.  Next visit: Wednesday, August 3 with Vinnie Level prior to MD appointment.  **Disclaimer: This note was dictated with voice recognition software. Similar sounding words can inadvertently be transcribed and this note may contain transcription errors which may not have been corrected upon publication of note.**

## 2021-05-17 NOTE — Therapy (Signed)
[facility] [address] [place], [place], [address] Phone: [phone]   Fax:  [phone]  Speech Language Pathology Treatment  Patient Details  Name: [name] MRN: [id number] Date of Birth: 1976/04/04 Referring Provider (SLP): Eppie Gibson, MD   Encounter Date: 05/16/2021   End of Session - 05/17/21 1705     Visit Number 8    Number of Visits 13    Date for SLP Re-Evaluation 07/06/21    SLP Start Time 4935    SLP Stop Time  5217    SLP Time Calculation (min) 43 min    Activity Tolerance Patient tolerated treatment well             Past Medical History:  Diagnosis Date   Cancer ([place])    tongue    Past Surgical History:  Procedure Laterality Date   DIRECT LARYNGOSCOPY N/A 09/05/2020   Procedure: DIRECT LARYNGOSCOPY;  Surgeon: Marcina Millard, MD;  Location: [facility];  Service: ENT;  Laterality: N/A;   ESOPHAGOSCOPY N/A 09/05/2020   Procedure: ESOPHAGOSCOPY;  Surgeon: Marcina Millard, MD;  Location: [facility];  Service: ENT;  Laterality: N/A;   FRACTURE SURGERY     TONGUE BIOPSY N/A 09/05/2020   Procedure: TONGUE BIOPSY;  Surgeon: Marcina Millard, MD;  Location: [facility];  Service: ENT;  Laterality: N/A;    There were no vitals filed for this visit.   Subjective Assessment - 05/17/21 1651     Subjective Pt enters with baby food (level I -puree, and one level 2 (dys II))    Currently in Pain? No/denies                   ADULT SLP TREATMENT - 05/17/21 0001       General Information   Behavior/Cognition Alert;Cooperative;Pleasant mood      Treatment Provided   Treatment provided Dysphagia      Dysphagia Treatment   Temperature Spikes Noted No    Respiratory Status Room air    Oral Cavity - Dentition Missing dentition    Treatment Methods Skilled observation;Compensation strategy training;Upgraded PO texture trial;Patient/caregiver education    Patient observed directly with  PO's Yes    Type of PO's observed Dysphagia 1 (puree);Thin liquids   (runny dys I)   Pharyngeal Phase Signs & Symptoms Wet vocal quality;Multiple swallows;Immediate throat clear   wet/throat clear with thin liquids only   Other treatment/comments Promise performed oral care <30 minutes prior to session start. He used spoon with runny dys I items - SLP educated pt on consistency to use by thinning puree with water. Pt demonstrated understanding. SLP had to provide mod-max cues for precautions initially, fading to occasional min A just for throat clear before next bite/sip, then pt was indepenent with this by session end. He was told he could still do ice chips at home if he cont'd to follow precautions at home, with oral care before and after. SLP reminded pt to perform oral care after he checks out today. No overt s/sx of difficulty except with thin (throat clear consistently/wet voice which cleared with throat clear). Pt was instructed to maintain nectar liquids at home instead of thin.      Assessment / Recommendations / Plan   Plan Continue with current plan of care      Dysphagia Recommendations   Diet recommendations NPO      Progression Toward Goals   Progression toward goals Progressing toward goals  SLP Education - 05/17/21 1704     Education Details do nectar liquids here and runy dys I here - ice chips at home following all precautions, swallow precautions    Person(s) Educated Patient    Methods Explanation;Demonstration;Verbal cues    Comprehension Verbalized understanding;Need further instruction;Returned demonstration;Verbal cues required              SLP Short Term Goals - 02/03/21 1647       SLP SHORT TERM GOAL #1   Title Pt will complete HEP with rare min A over two sessions    Baseline total A; 12-30-20    Period --   sessions, for all STGs   Status Achieved      SLP SHORT TERM GOAL #2   Title Pt will tell SLP 3 overt s/s aspiration PNA with  modified independence    Baseline none given    Status Achieved      SLP SHORT TERM GOAL #3   Title Pt will tell SLP rationale for HEP completion    Baseline total A    Status Achieved              SLP Long Term Goals - 05/17/21 1706       SLP LONG TERM GOAL #1   Title Pt will complete HEP with modified independence over 2 visits    Baseline total A;  02/18/21    Period --   sessions, for all LTGs   Status Achieved      SLP LONG TERM GOAL #2   Title pt will perform safety precautions from FEES with PO ice chips with modified independence 2 sessions    Baseline total A    Status Not Met   pt using water - not ice chips     SLP LONG TERM GOAL #3   Title Pt will tell SLP when to decr frequency of HEP    Baseline not provided yet    Time 1    Status Deferred      SLP LONG TERM GOAL #4   Title pt will complete 4 sets of 5 effortful swallows in less than 25 seconds    Time 8    Period Weeks    Status On-going      SLP LONG TERM GOAL #5   Title pt wil perform swallow precautions of the MBSS on 04-25-21 with modified independence in 3 sessions    Time [place] - 05/17/21 1706     Clinical Impression Statement Crespin presents today cont PEG tube dependent following significant oral and phayrngeal surgery in December requiring him to be NPO currently. Viraj had follow up FEES at [facility] 04-25-21 - see report for details under Care Everywhere. Fortunately pt cont iwth no overt s/sx aspiration PNA to date. SLP again highly stressed pt's need to perform oral care before and after PO ice chips at home. See "skiled intervention" for more details of today's session. SLP considering d/c if pt does not follow SLP recommendations of number of reps/day, therefore skilled speech therapy MAYcont to be beneficial to the pt in order to regularly assess pt's safety with POs and/or need for instrumental swallow assessment, as well as to assess  accurate completion of swallowing HEP.    Speech Therapy Frequency --   approx x2/month   Duration 8 weeks  Treatment/Interventions Aspiration precaution training;Pharyngeal strengthening exercises;Diet toleration management by SLP;Trials of upgraded texture/liquids;Patient/family education;Internal/external aids;SLP instruction and feedback;Compensatory strategies;Other (comment)    Potential to Achieve Goals Fair    Potential Considerations Severity of impairments;Previous level of function    SLP Home Exercise Plan pt rec'd at [facility] after FEES 11-29-20    Consulted and Agree with Plan of Care Patient             Patient will benefit from skilled therapeutic intervention in order to improve the following deficits and impairments:   Dysphagia, oropharyngeal phase  Severe dysarthria    Problem List 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[place]) 11/23/2020   HTN (hypertension) 10/15/2020   Influenza vaccine refused 09/21/2020   Hypertension 09/21/2020   Daily consumption of alcohol 09/13/2020   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

## 2021-05-18 ENCOUNTER — Encounter (HOSPITAL_COMMUNITY): Payer: Self-pay | Admitting: Dentistry

## 2021-05-18 ENCOUNTER — Ambulatory Visit (INDEPENDENT_AMBULATORY_CARE_PROVIDER_SITE_OTHER): Payer: Medicaid Other | Admitting: Dentistry

## 2021-05-18 ENCOUNTER — Other Ambulatory Visit: Payer: Self-pay

## 2021-05-18 DIAGNOSIS — K0889 Other specified disorders of teeth and supporting structures: Secondary | ICD-10-CM

## 2021-05-18 DIAGNOSIS — K036 Deposits [accretions] on teeth: Secondary | ICD-10-CM | POA: Diagnosis not present

## 2021-05-18 DIAGNOSIS — K029 Dental caries, unspecified: Secondary | ICD-10-CM

## 2021-05-18 DIAGNOSIS — K08109 Complete loss of teeth, unspecified cause, unspecified class: Secondary | ICD-10-CM | POA: Diagnosis not present

## 2021-05-18 DIAGNOSIS — K030 Excessive attrition of teeth: Secondary | ICD-10-CM

## 2021-05-18 DIAGNOSIS — R252 Cramp and spasm: Secondary | ICD-10-CM | POA: Diagnosis not present

## 2021-05-18 DIAGNOSIS — C023 Malignant neoplasm of anterior two-thirds of tongue, part unspecified: Secondary | ICD-10-CM

## 2021-05-18 DIAGNOSIS — Z923 Personal history of irradiation: Secondary | ICD-10-CM

## 2021-05-18 NOTE — Progress Notes (Signed)
Department of Dental Medicine      FULL MOUTH DEBRIDEMENT  Service Date:   05/18/2021  Patient Name:   [name] Date of Birth:   December 22, 1975 Medical Record Number: [id number]         TODAY'S VISIT:   Procedures:  Radiographs and full mouth debridement Assessment: Soft tissue:  WNL Caries risk:  High Periodontal impression:  Inflamed and erythematous gingival tissue with generalized calculus build-up both supra- and subgingivally.   Plan/recommendations Next visit:  Adult prophylaxis, finish new patient exam and restorative       PROGRESS NOTE:   COVID-19 SCREENING:  The patient denies symptoms concerning for COVID-19 infection including fever, chills, cough, or newly developed shortness of breath.   HISTORY OF PRESENT ILLNESS: [name] is a very pleasant 45 y.o. male with h/o HTN and cancer of anterior two-thirds of tongue s/p radiation therapy (total dose of 60/60 Gy), tracheostomy, mandibulotomy, partial left pharyngectomy, total glossectomy and bilateral mRND and ALT free flap who presents today for a full mouth debridement and dental radiographs.   DENTAL HISTORY: The patient reports having no new or worsening side effects since his last visit in May.  He reports that his dry mouth has improved and he has no concerns.   He currently denies any dental/orofacial pain or sensitivity. Patient is able to manage oral secretions.  Patient denies dysphagia, odynophagia, dysphonia, SOB and neck pain.  Patient denies fever, rigors and malaise.   CHIEF COMPLAINT: Here for a routine dental appointment.   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   Tongue carcinoma  ([place])    Past Medical History:  Diagnosis Date   Cancer ([place])    tongue   Past Surgical History:  Procedure Laterality Date   DIRECT LARYNGOSCOPY N/A 09/05/2020   Procedure: DIRECT LARYNGOSCOPY;  Surgeon: Marcina Millard, MD;  Location: [place];  Service: ENT;  Laterality: N/A;   ESOPHAGOSCOPY N/A 09/05/2020   Procedure: ESOPHAGOSCOPY;  Surgeon: Marcina Millard, MD;  Location: [place];  Service: ENT;  Laterality: N/A;   FRACTURE SURGERY     TONGUE BIOPSY N/A 09/05/2020   Procedure: TONGUE BIOPSY;  Surgeon: Marcina Millard, MD;  Location: [place];  Service: ENT;  Laterality: N/A;   Not on File Current Outpatient Medications  Medication Sig Dispense Refill   chlorthalidone (HYGROTON) 25 MG tablet Take 1 tablet (25 mg total) by mouth daily. (Patient not taking: No sig reported) 90 tablet 3   Cyanocobalamin (B-12) 1000 MCG SUBL Place 1,000 mcg under the tongue daily. (Patient not taking: No sig reported) 30 tablet 2   ferrous sulfate 325 (65 FE) MG EC tablet Take 1 tablet (325 mg total) by mouth 2 (two) times daily with a meal. 60 tablet 2   ferrous sulfate 325 (65 FE) MG tablet TAKE 1 TABLET BY MOUTH 2 TIMES DAILY WITH A MEAL (Patient not taking: No sig reported) 60 tablet 2   ibuprofen (ADVIL) 800 MG tablet Take 1 tablet (800 mg total) by mouth every 8 (eight) hours as needed. Take with food. (Patient not taking: Reported on 04/19/2021) 60 tablet 0   lidocaine (XYLOCAINE) 2 % solution Patient: Mix 1part 2%  viscous lidocaine, 1part H20. Swish & swallow 29mL of diluted mixture, 71min before meals and at bedtime, up to QID (Patient not taking: No sig reported) 200 mL 3   Nutritional Supplements (FEEDING SUPPLEMENT, OSMOLITE 1.5 CAL,) LIQD Increase Osmolite 1.5 to 8 cartons daily with 60 mL free water before and after each bolus feeding. Feed via low profile gastrostomy tube. Provides 100% estimated needs. 2840 kcal, 119 gm pro. 1896 mL 0   oxyCODONE (OXY IR/ROXICODONE) 5 MG immediate release  tablet Take 5 mg by mouth 2 (two) times daily as needed. (Patient not taking: No sig reported)     sodium fluoride (PREVIDENT 5000 PLUS) 1.1 % CREA dental cream Place a pea-size amount on toothbrush and brush teeth.  Do not rinse with water or eat after for 30 minutes after use.  Use 1-2 times daily. 51 g 11   No current facility-administered medications for this visit.    LABS: Lab Results  Component Value Date   WBC 3.2 (L) 03/02/2021   HGB 13.3 03/02/2021   HCT 41.7 03/02/2021   MCV 84.8 03/02/2021   PLT 201 03/02/2021      Component Value Date/Time   NA 140 03/02/2021 1122   K 4.3 03/02/2021 1122   CL 101 03/02/2021 1122   CO2 30 03/02/2021 1122   GLUCOSE 91 03/02/2021 1122   BUN 12 03/02/2021 1122   CREATININE 0.75 03/02/2021 1122   CALCIUM 10.1 03/02/2021 1122   GFRNONAA >60 03/02/2021 1122   GFRAA >60 11/09/2018 1126   No results found for: INR, PROTIME No results found for: PTT  Social History   Socioeconomic History   Marital status: Single    Spouse name: Not on file   Number of children: Not on file   Years of education: Not on file   Highest education level: Not on file  Occupational History   Not on file  Tobacco Use   Smoking status: Former    Packs/day: 0.50    Types: Cigarettes   Smokeless tobacco: Never  Vaping Use   Vaping Use: Never used  Substance and Sexual Activity   Alcohol use: Not Currently   Drug use: Yes    Types: Marijuana   Sexual activity: Not Currently  Other Topics Concern   Not on file  Social History Narrative   Not on file   Social Determinants of Health   Financial Resource Strain: Not on file  Food Insecurity: Not on file  Transportation Needs: No Transportation Needs   Lack of Transportation (Medical): No   Lack of Transportation (Non-Medical): No  Physical Activity: Not on file  Stress: Not on file  Social Connections: Not on file  Intimate Partner Violence: Not on file   Family History  Problem Relation Age  of Onset   Lung cancer Mother    Stroke Father    Lung cancer Sister    Lung cancer Maternal Grandmother     REVIEW OF SYSTEMS:  Reviewed with the patient as per HPI. Psych: Patient denies having dental phobia.   VITAL SIGNS: BP (!) 142/90 (BP Location: Right Arm, Patient Position: Sitting, Cuff Size: Normal)   Pulse 98   Temp 98.3 F (36.8 C) (Oral)    PHYSICAL EXAM:  Soft tissue exam completed and charted.  General:  Well-developed, comfortable and in no apparent distress. Neurological:  Alert and oriented to person, place and  time. Extraoral:  No swelling or lymphadenopathy. Maximum Interincisal Opening:   25 mm (measured at dental  visit on 03/18/21) Intraoral:  Soft tissues appear well-perfused and mucous membranes moist.  No signs of infection, parulis, sinus tract, edema or erythema.   DENTAL EXAM:  Hard tissue exam (limited) completed today; both hard tissue and periodontal exam will be finalized and charted at next visit. Overall impression:  Fair remaining dentition. Oral hygiene:  Poor  Caries:  Restorable:  #8F(V), #79F(V), #12ML, #28B(V) Occlusion:  Unable to assess molar occlusion.  Missing >50% of posterior dentition. Other positive findings:  Attrition- #6, #7, #8, #9, #10 and #11 incisal. Monitor- #8 and #9 incisal for developing caries at recall visits due to darkened tooth structure into dentin.  Periodontal:  Plaque:  Slight and localized Calculus:  Heavy, generalized accumulation noted on all tooth surfaces both supra- and subgingivally. Mobility:  Class I mobility on #21, #22, #23 and #24. Class II mobility on #5. Class III mobility on #28. Gingival appearance:  Inflamed, erythematous tissue. Areas of detached tissue due to extent of calculus build-up subgingivally.    RADIOGRAPHIC EXAM:  PAN, 5 periapical images and 2 bitewings were exposed and interpreted.  Condyles seated bilaterally in fossas.  No evidence of abnormal pathology.  All visualized  osseous structures appear WNL. Radiopaque regions at midline of mandible consistent with surgical history. Smaller areas of radiopaque bodies evident bilaterally superimposing on mandible- also consistent with the patient's surgical history. Evidence of healing extraction sites in the posterior maxilla and mandible.  Generalized moderate to severe horizontal bone loss consistent with moderate to severe periodontitis.  Missing teeth. Caries- #12M. Evidence of wear/attrition on incisal surfaces of #6-#11.   ASSESSMENT:  1.  H/o head and neck radiation 2.  Accretions on teeth 3.  Cancer of anterior two-thirds of tongue 4.   Missing teeth 5.   Caries 6.   Loose teeth 7.   Attrition/wear 8.   Trismus   PROCEDURES: Full mouth debridement.  Removed all supra-gingival calculus and plaque using Cavitron and hand instruments. Oral hygiene instruction discussed with the patient.  Encouraged better home care including flossing and brushing two times a day. Toothbrush, toothpaste and floss given to the patient.   PLAN AND RECOMMENDATIONS: I explained all significant findings of the dental exam with the patient including heavy calculus or tartar build-up on all surfaces of his teeth and the recommended care including returning in about 2-3 weeks to finish the dental exam and treatment plan because his gums are too inflamed and friable due to chronic inflammation and periodontal infection to complete the cleaning and document cavities diagnostically.  I explained that he does have at least a few smaller cavities and some looser teeth due to bone loss, but we will finalize his treatment plan as well as discuss different options at his next appointment.  The patient verbalized understanding of all findings, discussion, and recommendations. Call if questions or concerns arise prior to next visit.  Next visit:  Adult prophylaxis, finish comprehensive exam + treatment planning and restorative (if time  permits).  All questions and concerns were invited and addressed.  The patient tolerated today's visit well and departed in stable condition.  [place], D.M.D.

## 2021-05-20 ENCOUNTER — Encounter: Payer: Self-pay | Admitting: Nurse Practitioner

## 2021-05-20 ENCOUNTER — Ambulatory Visit (INDEPENDENT_AMBULATORY_CARE_PROVIDER_SITE_OTHER): Payer: Medicaid Other | Admitting: Nurse Practitioner

## 2021-05-20 VITALS — BP 122/62 | HR 70 | Ht 69.5 in | Wt 144.0 lb

## 2021-05-20 DIAGNOSIS — Z1211 Encounter for screening for malignant neoplasm of colon: Secondary | ICD-10-CM | POA: Diagnosis not present

## 2021-05-20 DIAGNOSIS — D509 Iron deficiency anemia, unspecified: Secondary | ICD-10-CM | POA: Diagnosis not present

## 2021-05-20 MED ORDER — SUPREP BOWEL PREP KIT 17.5-3.13-1.6 GM/177ML PO SOLN: 1.0000 | ORAL | 0 refills | Status: DC

## 2021-05-20 NOTE — Progress Notes (Signed)
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 He is s/p total glossectomy via mandibular split approach, partial pharyngectomy, bilateral neck dissection, ALT free flap reconstruction on 10/15/20. Final pathology showed 5.4 cms tumor with DOI of 2.6 cms. No evidence of LVI or perineural invasion, margins free of tumor, closest approach is 0.3 cms from posterior soft tissue margin. He completed adjuvant radiation and FU  # IDA. Hgb normal at 13.4 at time of tongue cancer diagnosis. He had cancer surgery in December and in January his hgb was 9.8.  Over the next few months his hgb continued to trend down with associated iron deficiency.  Started on B12 and oral iron with improvement in hgb 13 but still iron deficient with ferritin of 22.  --For evaluation of IDA patient will be scheduled for colonoscopy. Will consent for EGD as well. At time of procedure Dr. Rush Landmark can evaluate whether EGD is feasible given patient's tongue cancer history.  The risks and benefits of EGD and colonoscopy with possible polypectomy / biopsies were discussed and the patient agrees to proceed.  Given surgical anatomy I think patient would be too high to have procedures done at the [facility] so will schedule to be done in the hospital --Hold oral iron 10 days prior to colonoscopy   # Abnormal pancreas on CT scan in January 2020. A 3 cm low density was seen in pancreatic head ,  not present on prior exam, and appeared to communicate with dilated duct in uncinate process of pancreatic head.  --Findings probably benign but will ask Dr. Rush Landmark to review and determine if further workup / imaging needed.     HISTORY OF PRESENT ILLNESS     Chief Complaint : anemia  [name] is a 45 y.o. male with a past medical history significant for tongue cancer and iron deficiency anemia. See PMH below for any  additional history.  In November 2021 [name] was diagnosed with tongue cancer.( SCC) He is s/p total glossectomy , partial pharyngectomy, bilateral neck dissection, ALT free flap reconstruction.   He completed adjuvant radiation and FU.  He is referred by Hematology for evaluation of iron deficiency anemia.  Prior to cancer surgery in December 2021 patient's hemoglobin was in the 13 range .  Labs in January showed a hemoglobin in the 9 range.   Over the following few months his hemoglobin continued to drift and he was found to be iron deficient.  After starting oral iron and B12 his hemoglobin is now normal at 13 but ferritin is just 22. Patient has not had any overt GI bleeding. He hasn't noticed any bleeding associated with gastrostomy tube. He has no abdominal pain, nausea / vomiting. His bowel movements are normal. He has never been a big consumer of NSAIDS. He gets nutrition via gastrostomy tube but also tolerates some PO of applesauce consistency.   Patient has no [facility] of colonoscopy    PREVIOUS GI EVALUATIONS:   Jan 2020 CT scan w/ contrast IMPRESSION: 3 cm low density is seen in pancreatic head which was not present on prior exam, and appears to communicate with dilated duct in uncinate process of pancreatic head. Etiology is unknown but most likely benign. However, further evaluation with MRI with and without gadolinium is recommend to rule out other pathology.   No other acute abnormality seen in the abdomen  or pelvis.   Aortic Atherosclerosis (ICD10-I70.0).    Past Medical History:  Diagnosis Date   Cancer [facility])    tongue     Past Surgical History:  Procedure Laterality Date   DIRECT LARYNGOSCOPY N/A 09/05/2020   Procedure: DIRECT LARYNGOSCOPY;  Surgeon: Marcina Millard, MD;  Location: [place];  Service: ENT;  Laterality: N/A;   ESOPHAGOSCOPY N/A 09/05/2020   Procedure: ESOPHAGOSCOPY;  Surgeon: Marcina Millard, MD;  Location: [place];  Service: ENT;  Laterality: N/A;    FRACTURE SURGERY     TONGUE BIOPSY N/A 09/05/2020   Procedure: TONGUE BIOPSY;  Surgeon: Marcina Millard, MD;  Location: [facility];  Service: ENT;  Laterality: N/A;   Family History  Problem Relation Age of Onset   Lung cancer Mother    Stroke Father    Lung cancer Sister    Lung cancer Maternal Grandmother    Social History   Tobacco Use   Smoking status: Former    Packs/day: 0.50    Types: Cigarettes   Smokeless tobacco: Never  Vaping Use   Vaping Use: Never used  Substance Use Topics   Alcohol use: Not Currently   Drug use: Yes    Types: Marijuana   Current Outpatient Medications  Medication Sig Dispense Refill   ferrous sulfate 325 (65 FE) MG EC tablet Take 1 tablet (325 mg total) by mouth 2 (two) times daily with a meal. 60 tablet 2   Nutritional Supplements (FEEDING SUPPLEMENT, OSMOLITE 1.5 CAL,) LIQD Increase Osmolite 1.5 to 8 cartons daily with 60 mL free water before and after each bolus feeding. Feed via low profile gastrostomy tube. Provides 100% estimated needs. 2840 kcal, 119 gm pro. 1896 mL 0   No current facility-administered medications for this visit.   Not on File   Review of Systems: All systems reviewed and negative except where noted in HPI.    PHYSICAL EXAM :    Wt Readings from Last 3 Encounters:  05/20/21 144 lb (65.3 kg)  05/16/21 145 lb 9.6 oz (66 kg)  04/19/21 143 lb 8 oz (65.1 kg)    BP 122/62   Pulse 70   Ht 5' 9.5" (1.765 m)   Wt 144 lb (65.3 kg)   BMI 20.96 kg/m  Constitutional:  Pleasant thin male in no acute distress. Psychiatric: Normal mood and affect. Behavior is normal. EENT: Pupils normal.  Conjunctivae are normal. No scleral icterus. Mouth: tongue appears dark in color and fixed. Marland Kitchen He is unable to stick his tongue out  Neck supple.  Cardiovascular: Normal rate, regular rhythm. No edema Pulmonary/chest: Effort normal and breath sounds normal. No wheezing, rales or rhonchi. Abdominal: Soft, nondistended, nontender. Bowel  sounds active throughout. There are no masses palpable. No hepatomegaly. Neurological: Alert and oriented to person place and time. Skin: Skin is warm and dry. No rashes noted.  Tye Savoy, NP  05/20/2021, 10:54 AM  Cc:  Referring Provider Benay Pike, MD

## 2021-05-20 NOTE — Patient Instructions (Signed)
If you are age 46 or younger, your body mass index should be between 19-25. Your Body mass index is 20.96 kg/m. If this is out of the aformentioned range listed, please consider follow up with your Primary Care Provider.   The [place] [facility] providers would like to encourage you to use [facility] to communicate with providers for non-urgent requests or questions.  Due to long hold times on the telephone, sending your provider a message by [facility] may be faster and more efficient way to get a response. Please allow 48 business hours for a response.  Please remember that this is for non-urgent requests/questions.  PROCEDURES: You have been scheduled for an EGD and Colonoscopy. Please follow the written instructions given to you at your visit today. Please pick up your prep supplies at the pharmacy within the next 1-3 days. If you use inhalers (even only as needed), please bring them with you on the day of your procedure.  HOLD IRON 10 DAYS PRIOR TO PROCEDURE.  It was great seeing you today! Thank you for entrusting me with your care and choosing [facility].  Tye Savoy, NP

## 2021-05-21 NOTE — Progress Notes (Signed)
Attending Physician's Attestation   I have reviewed the chart.   I agree with the Advanced Practitioner's note, impression, and recommendations with any updates as below. Will approach evaluation of IDA with EGD/colonoscopy.  Having it done at the hospital also allows [place] to have nasopharyngeal/baby endoscope in case adult endoscope was unable to pass due to prior surgical history neck.  Based on the patient's previous imaging, I do think further imaging of the pancreatic cyst/lesion is necessary.  With concern of the dilated duct in the uncinate 1 would have to worry about whether this is a branch duct versus mixed IPMN.  Recommend MRI/MRCP to further define this lesion as able.  If we are unable to even pass an adult endoscope through the upper GI tract (pending evaluation) there is no way to perform endoscopic ultrasound.   Justice Britain, MD [facility] Advanced Endoscopy Office # [phone]

## 2021-05-23 ENCOUNTER — Other Ambulatory Visit: Payer: Self-pay

## 2021-05-23 DIAGNOSIS — K869 Disease of pancreas, unspecified: Secondary | ICD-10-CM

## 2021-05-23 DIAGNOSIS — K862 Cyst of pancreas: Secondary | ICD-10-CM

## 2021-05-24 ENCOUNTER — Ambulatory Visit: Payer: Medicaid Other | Admitting: Rehabilitation

## 2021-05-25 ENCOUNTER — Encounter: Payer: Self-pay | Admitting: Rehabilitation

## 2021-05-25 ENCOUNTER — Ambulatory Visit: Payer: Medicaid Other | Admitting: Rehabilitation

## 2021-05-25 ENCOUNTER — Other Ambulatory Visit: Payer: Self-pay

## 2021-05-25 DIAGNOSIS — M436 Torticollis: Secondary | ICD-10-CM

## 2021-05-25 DIAGNOSIS — R293 Abnormal posture: Secondary | ICD-10-CM

## 2021-05-25 DIAGNOSIS — C029 Malignant neoplasm of tongue, unspecified: Secondary | ICD-10-CM

## 2021-05-25 DIAGNOSIS — I890 Lymphedema, not elsewhere classified: Secondary | ICD-10-CM

## 2021-05-25 DIAGNOSIS — M6281 Muscle weakness (generalized): Secondary | ICD-10-CM

## 2021-05-25 NOTE — Therapy (Addendum)
[place], [place], [address] Phone: [phone]   Fax:  [phone]  Physical Therapy Treatment  Patient Details  Name: [name] MRN: [id number] Date of Birth: [birth year] Referring Provider (PT): Reita May Date: 05/25/2021   PT End of Session - 05/25/21 1450     Visit Number 6    Number of Visits 12    Date for PT Re-Evaluation 05/30/21    Authorization Time Period until 05/29/21    Authorization - Visit Number 6    Authorization - Number of Visits 12    PT Start Time [id number]    PT Stop Time [id number]    PT Time Calculation (min) 41 min    Activity Tolerance Patient tolerated treatment well    Behavior During Therapy [facility] for tasks assessed/performed             Past Medical History:  Diagnosis Date   Cancer ([place])    tongue    Past Surgical History:  Procedure Laterality Date   DIRECT LARYNGOSCOPY N/A 09/05/2020   Procedure: DIRECT LARYNGOSCOPY;  Surgeon: Marcina Millard, MD;  Location: [place];  Service: ENT;  Laterality: N/A;   ESOPHAGOSCOPY N/A 09/05/2020   Procedure: ESOPHAGOSCOPY;  Surgeon: Marcina Millard, MD;  Location: [place];  Service: ENT;  Laterality: N/A;   FRACTURE SURGERY     TONGUE BIOPSY N/A 09/05/2020   Procedure: TONGUE BIOPSY;  Surgeon: Marcina Millard, MD;  Location: [place];  Service: ENT;  Laterality: N/A;    There were no vitals filed for this visit.   Subjective Assessment - 05/25/21 1356     Subjective It is feeling a bit looser.  I haven't heard from the garment place (email says awaiting medicaid auth)    Pertinent History SCC of tongue Stage IVA, 09/15/20 performed direct laryngoscopy with biopsy with pathology revealing well differentiated invasive squamous cell carcinoma without perineural or lymphovascular invasion, 10/12/20- MRI revealed large mass within the tongue without evidence of crossing midline, tumor bulged into sublingunal region on  the L, 10/15/20- tracheostomy, mandibulotomy, partial left pharyngectomy, total glossectomy, bilateral mRND and ALT free flap,  radiation to tongue/tumor bed and bilateral neck, started on 12/09/20 and complete 01/19/21. PEG and dental extractions performed during surgery.    Currently in Pain? No/denies                               [facility] Adult PT Treatment/Exercise - 05/25/21 0001       Neck Exercises: Seated   Postural Training cervical retractions    Other Seated Exercise bilateral rotation x 5, BB x 10    Other Seated Exercise scapular retraction/shoulder rolls x 10      Manual Therapy   Manual therapy comments Passive stretching gentle into bil rotation and sidebending    Soft tissue mobilization scar mobilization to all incisions    Manual Lymphatic Drainage (MLD) PT performed in supine HOB elevated: Collarbones, deep breathing, bilateral armpits, side of chest, front of chest, collarbones Back of neck into river, side of neck into river and front of neck into river, center neck downward, jaws and face reversing all steps to decrease neck swelling                         PT Long Term Goals - 03/30/21 1255  PT LONG TERM GOAL #1   Title Pt will  increase cervical extension to 30 degrees so that he wil be able to look up easier during ADLs    Baseline 12 degrees on 03/30/2021    Time 8    Period Weeks    Status New      PT LONG TERM GOAL #2   Title Pt will be independent  in a home exericse for neck ROM and stretching    Time 8    Period Weeks    Status New      PT LONG TERM GOAL #3   Title Pt will decrease neck circumference at 6 cm proximal to sternal notch by 2 cm    Baseline 42.5 on 6/1/ 2022    Time 8    Period Weeks    Status New      PT LONG TERM GOAL #4   Title Pt wil be able to manage his neck lymphedema at home with self MLD/Flexitouch, compression, and exercise    Baseline no knowledge    Time 8    Period Weeks     Status New                   Plan - 05/25/21 1451     Clinical Impression Statement Continued PT led MLD and scar tissue mobilization.  Pt continues with very tight bil UT, scalenes and SCM Lt>Rt  Pt has not heard anything back from [employer] and they are waiting for authorization which can take up to 30 days. Pt still has 6 visits approved but is now at the end of his time frame.  Pt would benefit from completing his visits due to tightness, edema, and waiting on a garment so he will need a goal check and date extension if allowed.    PT Frequency 2x / week    PT Duration 8 weeks    PT Treatment/Interventions ADLs/Self Care Home Management;Patient/family education;Therapeutic exercise;Therapeutic activities;Orthotic Fit/Training;Manual lymph drainage;Manual techniques;Scar mobilization;Taping;Passive range of motion;Joint Manipulations    PT Next Visit Plan needs date extension for more visits if possible? Continue scar mobs, Cervical ROM, and MLD and review self MLD , continue postural education.  Add mini squats to table for leg strength, assess garments when they come in    Consulted and Agree with Plan of Care Patient             Patient will benefit from skilled therapeutic intervention in order to improve the following deficits and impairments:     Visit Diagnosis: Abnormal posture  Lymphedema, not elsewhere classified  Muscle weakness (generalized)  Neck stiffness  Squamous cell carcinoma of tongue (HCC)     Problem List 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[place]) 11/23/2020   HTN (hypertension) 10/15/2020   Influenza vaccine refused 09/21/2020   Hypertension 09/21/2020   Daily consumption of alcohol 09/13/2020   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     Shan Levans, PT

## 2021-05-26 ENCOUNTER — Ambulatory Visit: Payer: Medicaid Other | Admitting: Physical Therapy

## 2021-05-29 ENCOUNTER — Emergency Department (HOSPITAL_COMMUNITY): Payer: Medicaid Other

## 2021-05-29 ENCOUNTER — Other Ambulatory Visit: Payer: Self-pay

## 2021-05-29 ENCOUNTER — Emergency Department (HOSPITAL_COMMUNITY)
Admission: EM | Admit: 2021-05-29 | Discharge: 2021-05-29 | Disposition: A | Discharge status: Home / Self Care | Payer: Medicaid Other | Attending: Interventional Radiology | Admitting: Interventional Radiology

## 2021-05-29 ENCOUNTER — Encounter (HOSPITAL_COMMUNITY): Payer: Self-pay | Admitting: Emergency Medicine

## 2021-05-29 DIAGNOSIS — Z79899 Other long term (current) drug therapy: Secondary | ICD-10-CM | POA: Insufficient documentation

## 2021-05-29 DIAGNOSIS — Z431 Encounter for attention to gastrostomy: Secondary | ICD-10-CM | POA: Diagnosis not present

## 2021-05-29 DIAGNOSIS — C029 Malignant neoplasm of tongue, unspecified: Secondary | ICD-10-CM | POA: Diagnosis not present

## 2021-05-29 DIAGNOSIS — Z823 Family history of stroke: Secondary | ICD-10-CM | POA: Insufficient documentation

## 2021-05-29 DIAGNOSIS — K9423 Gastrostomy malfunction: Secondary | ICD-10-CM | POA: Diagnosis present

## 2021-05-29 DIAGNOSIS — Z923 Personal history of irradiation: Secondary | ICD-10-CM | POA: Insufficient documentation

## 2021-05-29 DIAGNOSIS — Z9889 Other specified postprocedural states: Secondary | ICD-10-CM | POA: Diagnosis not present

## 2021-05-29 DIAGNOSIS — Z87891 Personal history of nicotine dependence: Secondary | ICD-10-CM | POA: Insufficient documentation

## 2021-05-29 DIAGNOSIS — Z801 Family history of malignant neoplasm of trachea, bronchus and lung: Secondary | ICD-10-CM | POA: Diagnosis not present

## 2021-05-29 HISTORY — PX: IR REPLACE G-TUBE SIMPLE WO FLUORO: IMG2323

## 2021-05-29 IMAGING — XA IR TUBE PLACEMENT/REPLACEMENT/CHECK
1 series · 4 of 4 positions shown · non-contrast
Comparison: none

INDICATION: Squamous cell carcinoma of the tongue status post surgery radiation,
G-tube dependent, G tube accidentally removed

[Series 1: fl - angio · 4 of 29 frames shown]
[frame 5/29]
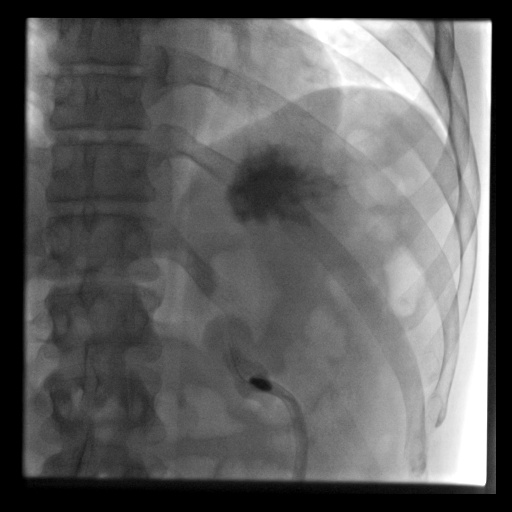
[frame 11/29]
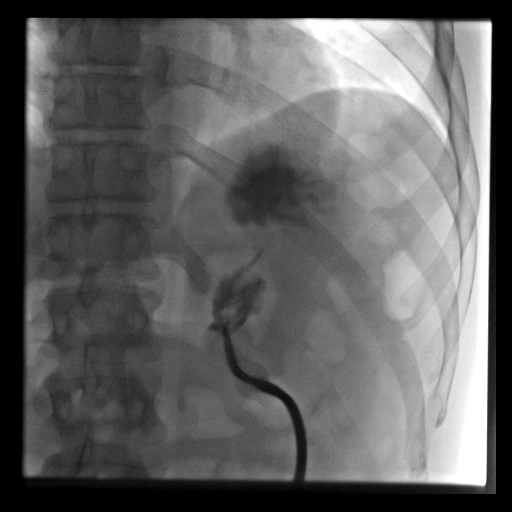
[frame 15/29]
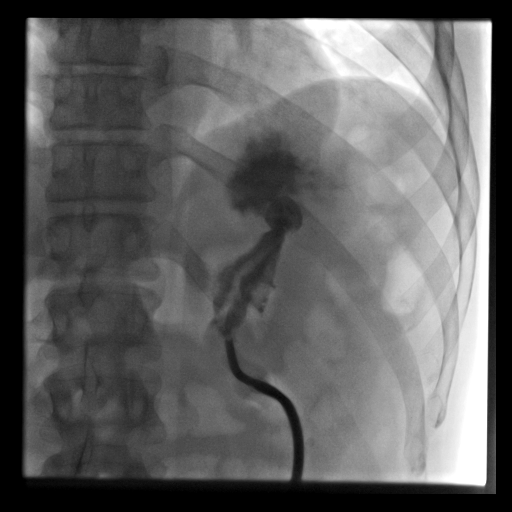
[frame 25/29]
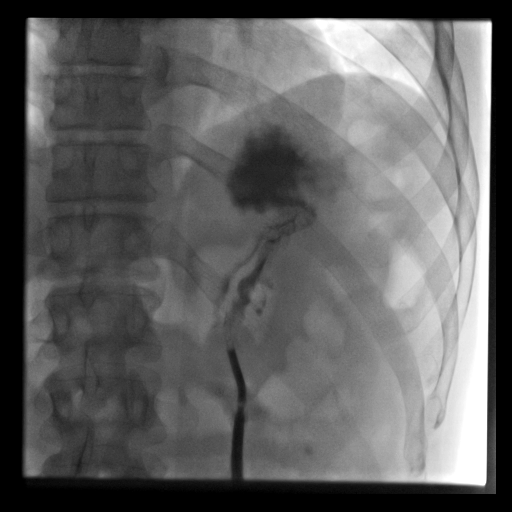

[4 of 4 positions shown; findings below may reference images not displayed]

EXAM:
FLUOROSCOPIC REPLACEMENT OF THE 18 SAPARILLA GASTROSTOMY

MEDICATIONS:
NONE.

ANESTHESIA/SEDATION:
NONE.

CONTRAST:  10 cc-administered into the gastric lumen.

FLUOROSCOPY TIME:  Fluoroscopy Time: 0 minutes 12 seconds (2 mGy).

COMPLICATIONS:
None immediate.

PROCEDURE:
Informed written consent was obtained from the patient after a
thorough discussion of the procedural risks, benefits and
alternatives. All questions were addressed. Maximal Sterile Barrier
Technique was utilized including caps, mask, sterile gowns, sterile
gloves, sterile drape, hand hygiene and skin antiseptic. A timeout
was performed prior to the initiation of the procedure.

Under sterile conditions, the existing left upper quadrant
percutaneous tract was cannulated with a 5 French Kumpe catheter.
Contrast injection confirms position in the stomach. Amplatz
guidewire inserted followed by placement of an 18 SAPARILLA
gastrostomy. Retention balloon inflated with 7 cc saline. Contrast
injection confirms position. Images obtained for documentation.
Feeding tube ready for use.
IMPRESSION: Successful fluoroscopic replacement of the 18 SAPARILLA
gastrostomy. Ready for use.

## 2021-05-29 MED ORDER — IOHEXOL 300 MG/ML  SOLN
50.0000 mL | Freq: Once | INTRAMUSCULAR | Status: AC | PRN
  Administered 2021-05-29: 10 mL

## 2021-05-29 MED ORDER — LIDOCAINE VISCOUS HCL 2 % MT SOLN
OROMUCOSAL | Status: AC
  Filled 2021-05-29: qty 15

## 2021-05-29 NOTE — ED Notes (Signed)
Pt discharged. Instructions given. AAOX4. Pt in no apparent distress or pain. The opportunity to ask questions was provided.  

## 2021-05-29 NOTE — ED Notes (Signed)
Pt transported to IR, AAOx 4, in no apparent distress or pain.

## 2021-05-29 NOTE — ED Notes (Signed)
PA unable to pass mickey button. AC contacting peds at [facility] for possible device.

## 2021-05-29 NOTE — ED Provider Notes (Signed)
[facility] DEPT Provider Note   CSN: [id number] Arrival date & time: 05/29/21  0542     History Chief Complaint  Patient presents with   Feeding Tube Replacement    [name] is a 45 y.o. male.  HPI He complains of accidentally dislodging his G-tube, about 45 minutes prior to arrival when it caught on a toilet seat.  He tried to replace it with a new one at home.  On arrival here, an attempt was made to replace it and failed.  Patient denies pain on insertion attempts.  He denies nausea, vomiting, weakness or dizziness at this time.  He has a oral cancer, and is able to get a little bit of intake orally but mostly uses his G-tube for nutrition and fluids.  There are no other known active modifying factors.    Past Medical History:  Diagnosis Date   Cancer [facility])    tongue    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[place]) 11/23/2020   HTN (hypertension) 10/15/2020   Influenza vaccine refused 09/21/2020   Hypertension 09/21/2020   Daily consumption of alcohol 09/13/2020   Squamous cell cancer of tongue ([place]) 09/13/2020   Tongue carcinoma ([place])     Past Surgical History:  Procedure Laterality Date   DIRECT LARYNGOSCOPY N/A 09/05/2020   Procedure: DIRECT LARYNGOSCOPY;  Surgeon: Marcina Millard, MD;  Location: [place];  Service: ENT;  Laterality: N/A;   ESOPHAGOSCOPY N/A 09/05/2020   Procedure: ESOPHAGOSCOPY;  Surgeon: Marcina Millard, MD;  Location: [place];  Service: ENT;  Laterality: N/A;   FRACTURE SURGERY     TONGUE BIOPSY N/A 09/05/2020   Procedure: TONGUE BIOPSY;  Surgeon: Marcina Millard, MD;  Location: [place];  Service: ENT;  Laterality: N/A;       Family History  Problem Relation Age of Onset   Lung cancer Mother    Stroke Father    Lung cancer Sister    Lung cancer Maternal Grandmother     Social History   Tobacco Use    Smoking status: Former    Packs/day: 0.50    Types: Cigarettes   Smokeless tobacco: Never  Vaping Use   Vaping Use: Never used  Substance Use Topics   Alcohol use: Not Currently   Drug use: Yes    Types: Marijuana    Home Medications Prior to Admission medications   Medication Sig Start Date End Date Taking? Authorizing Provider  ferrous sulfate 325 (65 FE) MG EC tablet Take 1 tablet (325 mg total) by mouth 2 (two) times daily with a meal. 04/11/21   Benay Pike, MD  Nutritional Supplements (FEEDING SUPPLEMENT, OSMOLITE 1.5 CAL,) LIQD Increase Osmolite 1.5 to 8 cartons daily with 60 mL free water before and after each bolus feeding. Feed via low profile gastrostomy tube. Provides 100% estimated needs. 2840 kcal, 119 gm pro. 02/03/21   Eppie Gibson, MD  SUPREP BOWEL PREP KIT 17.5-3.13-1.6 GM/177ML SOLN Take 1 kit by mouth as directed. For colonoscopy prep 05/20/21   Willia Craze, NP    Allergies    Patient has no known allergies.  Review of Systems   Review of Systems  All other systems reviewed and are negative.  Physical Exam Updated Vital Signs BP 132/88   Pulse 72   Resp 18   SpO2 100%   Physical Exam Vitals and nursing note reviewed.  Constitutional:      Appearance: He is well-developed. He is not ill-appearing.  HENT:     Head: Normocephalic and atraumatic.     Right Ear: External ear normal.     Left Ear: External ear normal.  Eyes:     Conjunctiva/sclera: Conjunctivae normal.     Pupils: Pupils are equal, round, and reactive to light.  Neck:     Trachea: Phonation normal.  Cardiovascular:     Rate and Rhythm: Normal rate.  Pulmonary:     Effort: Pulmonary effort is normal.  Abdominal:     General: There is no distension.     Palpations: There is no mass.     Tenderness: There is no abdominal tenderness.     Hernia: No hernia is present.     Comments: G-tube site, left upper quadrant, ostomy appears normal.  Small amount of blood present from  prior attempts at insertion of the G-tube.  Musculoskeletal:        General: Normal range of motion.     Cervical back: Normal range of motion and neck supple.  Skin:    General: Skin is warm and dry.  Neurological:     Mental Status: He is alert and oriented to person, place, and time.     Cranial Nerves: No cranial nerve deficit.     Sensory: No sensory deficit.     Motor: No abnormal muscle tone.     Coordination: Coordination normal.  Psychiatric:        Mood and Affect: Mood normal.        Behavior: Behavior normal.        Thought Content: Thought content normal.        Judgment: Judgment normal.    ED Results / Procedures / Treatments   Labs (all labs ordered are listed, but only abnormal results are displayed) Labs Reviewed - No data to display  EKG None  Radiology No results found.  Procedures Procedures   Medications Ordered in ED Medications - No data to display  ED Course  I have reviewed the triage vital signs and the nursing notes.  Pertinent labs & imaging results that were available during my care of the patient were reviewed by me and considered in my medical decision making (see chart for details).  Clinical Course as of 05/29/21 1601  Nancy Fetter May 29, 2021  0932 I attempted to replace the G-tube with a new device which he had with him, using lubrication.  Unable to pass the tube into the ostomy.  Will page IR for assistance. [EW]  [id number] Case discussed with Dr. Reesa Chew, interventional radiologist who will have the patient assessed and managed for G-tube placement later today. [EW]    Clinical Course User Index [EW] Daleen Bo, MD   MDM Rules/Calculators/A&P                            Patient Vitals for the past 24 hrs:  BP Pulse Resp SpO2  05/29/21 0700 132/88 72 18 100 %  05/29/21 0619 (!) 131/98 84 19 100 %    9:48 AM Reevaluation with update and discussion. After initial assessment and treatment, an updated evaluation reveals he has  returned from radiology where he had successful fluoroscopic placement of his G-tube.  Tube is currently functional.  Patient is currently comfortable.  Findings discussed and questions answered. Daleen Bo   Medical Decision Making:  This patient is presenting for  evaluation of accidentally pulling G-tube out, which does require a range of treatment options, and is a complaint that involves a moderate risk of morbidity and mortality.   Critical Interventions-clinical evaluation, attempted G-tube replacement by me, consultation with interventional radiology for placement of G-tube.  After These Interventions, the Patient was reevaluated and was found stable for discharge.  G-tube has been replaced by Dr. Bayard Hugger  CRITICAL CARE-no Performed by: Daleen Bo  Nursing Notes Reviewed/ Care Coordinated Applicable Imaging Reviewed Interpretation of Laboratory Data incorporated into ED treatment  The patient appears reasonably screened and/or stabilized for discharge and I doubt any other medical condition or other [facility] requiring further screening, evaluation, or treatment in the ED at this time prior to discharge.  Plan: Home Medications-continue usual; Home Treatments-regular treatments; return here if the recommended treatment, does not improve the symptoms; Recommended follow up-PCP and GI, as needed     Final Clinical Impression(s) / ED Diagnoses Final diagnoses:  Attention to G-tube [facility])    Rx / DC Orders ED Discharge Orders     None        Daleen Bo, MD 05/29/21 [id number]

## 2021-05-29 NOTE — ED Notes (Signed)
Per [facility], Peds ED has a 22f mickey available if need be and will need to be picked up by security.

## 2021-05-29 NOTE — Procedures (Signed)
Interventional Radiology Procedure Note  Procedure: fluoro 18 fr mic gtube replaced    Complications: None  Estimated Blood Loss:  0  Findings: Ready for use    Tamera Punt, MD

## 2021-05-29 NOTE — Discharge Instructions (Addendum)
Continue your current treatments at home

## 2021-05-29 NOTE — ED Notes (Signed)
Patient uses an 18Fr mickey button. Informed PA that we do not have mickey buttons.

## 2021-05-29 NOTE — ED Triage Notes (Signed)
Patient arrives from home via EMS. Patient went to go use the bathroom when his tube got caught and pulled out. Patient attempted to replace his own tube, but states he was unable to pass it all the way through.

## 2021-05-29 NOTE — ED Provider Notes (Signed)
Emergency Medicine Provider Triage Evaluation Note  [name] , a 45 y.o. male  was evaluated in triage.  Pt complains of feeding tube was dislodged about an hour ago.  He has a replacement MIC-KEY G tube, but was unable to replace it.  He denies pain.   Review of Systems  Positive: Feeding tube dislodged Negative: Vomiting, pain  Physical Exam  There were no vitals taken for this visit. Gen:   Awake, no distress   Resp:  Normal effort  MSK:   Moves extremities without difficulty  Other:    Medical Decision Making  Medically screening exam initiated at 5:53 AM.  Appropriate orders placed.  Dicie Beam Barona was informed that the remainder of the evaluation will be completed by another provider, this initial triage assessment does not replace that evaluation, and the importance of remaining in the ED until their evaluation is complete.  Attempted to reinsert G-tube at bedside to maintain patency, but was unsuccessful.  May need IR.   Montine Circle, PA-C 05/29/21 [id number]    Randal Buba, April, MD 05/29/21 [phone]

## 2021-05-30 ENCOUNTER — Ambulatory Visit: Payer: Medicaid Other | Attending: Radiation Oncology

## 2021-05-30 ENCOUNTER — Telehealth: Payer: Self-pay

## 2021-05-30 DIAGNOSIS — R471 Dysarthria and anarthria: Secondary | ICD-10-CM | POA: Insufficient documentation

## 2021-05-30 DIAGNOSIS — R1312 Dysphagia, oropharyngeal phase: Secondary | ICD-10-CM | POA: Insufficient documentation

## 2021-06-01 ENCOUNTER — Inpatient Hospital Stay: Payer: Medicaid Other | Admitting: Dietician

## 2021-06-01 ENCOUNTER — Encounter: Payer: Self-pay | Admitting: Hematology and Oncology

## 2021-06-01 ENCOUNTER — Other Ambulatory Visit: Payer: Self-pay

## 2021-06-01 ENCOUNTER — Inpatient Hospital Stay: Payer: Medicaid Other | Attending: Hematology and Oncology | Admitting: Hematology and Oncology

## 2021-06-01 ENCOUNTER — Inpatient Hospital Stay: Payer: Medicaid Other

## 2021-06-01 VITALS — BP 135/84 | HR 81 | Temp 97.1°F | Resp 18 | Wt 146.9 lb

## 2021-06-01 DIAGNOSIS — Z87891 Personal history of nicotine dependence: Secondary | ICD-10-CM | POA: Insufficient documentation

## 2021-06-01 DIAGNOSIS — D509 Iron deficiency anemia, unspecified: Secondary | ICD-10-CM

## 2021-06-01 DIAGNOSIS — Z823 Family history of stroke: Secondary | ICD-10-CM | POA: Insufficient documentation

## 2021-06-01 DIAGNOSIS — Z923 Personal history of irradiation: Secondary | ICD-10-CM | POA: Insufficient documentation

## 2021-06-01 DIAGNOSIS — Z801 Family history of malignant neoplasm of trachea, bronchus and lung: Secondary | ICD-10-CM | POA: Diagnosis not present

## 2021-06-01 DIAGNOSIS — D649 Anemia, unspecified: Secondary | ICD-10-CM | POA: Diagnosis not present

## 2021-06-01 DIAGNOSIS — C023 Malignant neoplasm of anterior two-thirds of tongue, part unspecified: Secondary | ICD-10-CM | POA: Insufficient documentation

## 2021-06-01 DIAGNOSIS — Z79899 Other long term (current) drug therapy: Secondary | ICD-10-CM | POA: Diagnosis not present

## 2021-06-01 DIAGNOSIS — F1290 Cannabis use, unspecified, uncomplicated: Secondary | ICD-10-CM | POA: Diagnosis not present

## 2021-06-01 DIAGNOSIS — Z1329 Encounter for screening for other suspected endocrine disorder: Secondary | ICD-10-CM

## 2021-06-01 LAB — CBC WITH DIFFERENTIAL/PLATELET
Abs Immature Granulocytes: 0 10*3/uL (ref 0.00–0.07)
Basophils Absolute: 0 10*3/uL (ref 0.0–0.1)
Basophils Relative: 1 %
Eosinophils Absolute: 0 10*3/uL (ref 0.0–0.5)
Eosinophils Relative: 1 %
HCT: 38.7 % — ABNORMAL LOW (ref 39.0–52.0)
Hemoglobin: 12.9 g/dL — ABNORMAL LOW (ref 13.0–17.0)
Immature Granulocytes: 0 %
Lymphocytes Relative: 18 %
Lymphs Abs: 0.7 10*3/uL (ref 0.7–4.0)
MCH: 28.9 pg (ref 26.0–34.0)
MCHC: 33.3 g/dL (ref 30.0–36.0)
MCV: 86.6 fL (ref 80.0–100.0)
Monocytes Absolute: 0.5 10*3/uL (ref 0.1–1.0)
Monocytes Relative: 12 %
Neutro Abs: 2.6 10*3/uL (ref 1.7–7.7)
Neutrophils Relative %: 68 %
Platelets: 215 10*3/uL (ref 150–400)
RBC: 4.47 MIL/uL (ref 4.22–5.81)
RDW: 14.4 % (ref 11.5–15.5)
WBC: 3.8 10*3/uL — ABNORMAL LOW (ref 4.0–10.5)
nRBC: 0 % (ref 0.0–0.2)

## 2021-06-01 LAB — BASIC METABOLIC PANEL - CANCER CENTER ONLY
Anion gap: 8 (ref 5–15)
BUN: 13 mg/dL (ref 6–20)
CO2: 31 mmol/L (ref 22–32)
Calcium: 10.3 mg/dL (ref 8.9–10.3)
Chloride: 101 mmol/L (ref 98–111)
Creatinine: 0.74 mg/dL (ref 0.61–1.24)
GFR, Estimated: >60 mL/min (ref >60)
Glucose, Bld: 98 mg/dL (ref 70–99)
Potassium: 4.1 mmol/L (ref 3.5–5.1)
Sodium: 140 mmol/L (ref 135–145)

## 2021-06-01 LAB — MAGNESIUM: Magnesium: 1.9 mg/dL (ref 1.7–2.4)

## 2021-06-01 LAB — PHOSPHORUS: Phosphorus: 2.1 mg/dL — ABNORMAL LOW (ref 2.5–4.6)

## 2021-06-01 NOTE — Assessment & Plan Note (Signed)
1. [name] is a 45 y.o. 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 He is s/p total glossectomy via mandibular split approach, partial pharyngectomy, bilateral neck dissection, ALT free flap reconstruction on 10/15/20. Final pathology showed 5.4 cms tumor with DOI of 2.6 cms. No evidence of LVI or perineural invasion, margins free of tumor, closest approach is 0.3 cms from posterior soft tissue margin. 54 LN negative.  He completed adjuvant radiation and is recovering well. He was following up with [place] for management of anemia, this has significantly improved since he completed radiation.  He is he is hemoglobin is near normal, currently continues on oral iron supplementation.  Have also recommended he proceed with colonoscopy given iron deficiency, this is scheduled for end of August. His white blood cell count has significantly improved too.  At this time unless the colonoscopy results show anything significant from heme-onc standpoint, he can continue to follow-up with Dr. Isidore Moos and return to medical oncology as needed.  Dr. Isidore Moos and team will continue to monitor his thyroid numbers.

## 2021-06-01 NOTE — Progress Notes (Signed)
[place] NOTE  Patient Care Team: Pcp, No as PCP - General Eppie Gibson, MD as Consulting Physician (Radiation Oncology) Malmfelt, Stephani Police, RN as Oncology Nurse Navigator Fredricka Bonine, MD as Referring Physician (Otolaryngology) Benay Pike, MD as Consulting Physician (Hematology and Oncology)  CHIEF COMPLAINTS/PURPOSE OF CONSULTATION:  Squamous cell carcinoma of oral cavity  ASSESSMENT & PLAN:  Cancer of anterior two-thirds of tongue ([place]) 1. [name] is a 45 y.o. 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 He is s/p total glossectomy via mandibular split approach, partial pharyngectomy, bilateral neck dissection, ALT free flap reconstruction on 10/15/20. Final pathology showed 5.4 cms tumor with DOI of 2.6 cms. No evidence of LVI or perineural invasion, margins free of tumor, closest approach is 0.3 cms from posterior soft tissue margin. 54 LN negative.  He completed adjuvant radiation and is recovering well. He was following up with [place] for management of anemia, this has significantly improved since he completed radiation.  He is he is hemoglobin is near normal, currently continues on oral iron supplementation.  Have also recommended he proceed with colonoscopy given iron deficiency, this is scheduled for end of August. His white blood cell count has significantly improved too.  At this time unless the colonoscopy results show anything significant from heme-onc standpoint, he can continue to follow-up with Dr. Isidore Moos and return to medical oncology as needed.  Dr. Isidore Moos and team will continue to monitor his thyroid numbers.  Orders Placed This Encounter  Procedures   CBC with Differential/Platelet    Standing Status:   Standing    Number of Occurrences:   22    Standing Expiration Date:   06/01/2022   Iron and TIBC     Standing Status:   Future    Number of Occurrences:   1    Standing Expiration Date:   06/01/2022   Ferritin    Standing Status:   Future    Number of Occurrences:   1    Standing Expiration Date:   06/01/2022    HISTORY OF PRESENTING ILLNESS:   [name] 45 y.o. male is here because of new diagnosis of left tongue cancer.   He arrived with his family member today to the appointment.  He tells me that he first noticed some pain in the left tongue but attributed this to back to scraping his tongue.  He went in to see his dentist and was found to have this left tongue mass which was biopsied back in November and demonstrated invasive squamous cell carcinoma.  He then underwent MRI imaging and surgery at [facility], had total glossectomy, partial pharyngectomy, bilateral neck dissection and is following up with medical oncology and radiation oncology for adjuvant recommendations.  Biopsies revealed:  09/05/2020 FINAL MICROSCOPIC DIAGNOSIS:  A. TONGUE, LEFT BASE, BIOPSY:  - Invasive squamous cell carcinoma.  COMMENT:  There is no perineural or lymphovascular invasion. The carcinoma appears well differentiated ADDENDUM:  P16 is only focally and weakly positive in the invasive tumor (<5%).  10/12/2020 IMPRESSION: Large mass within the tongue apparently restricted to the left side, without evidence of crossing of the tongue midline. Lesion in total measures about 7 cm from front to back. Lesion extends all the way from the superior surface of the anterior tongue back to the posterior tongue and base of the left. Tumor bulges into the  sublingual region on the left. This appears very similar to the previous CT, as best as the 2 techniques can be compared. Invasion of the left submandibular gland is not demonstrated. There is edematous change of the adjacent soft tissues, questionably secondary to regional radiation. Mild edema and enhancement of level 2 nodes on the left likely related to  radiation. No evidence of  cervical lymph node enlargement or necrosis.  12/17. Had total glossectomy via mandibular split approach, partial pharyngectomy, bilateral neck dissection, ALT free flap reconstruction on 10/15/20. Final pathology showed 5.4 cms tumor with DOI of 2.6 cms. No evidence of LVI or perineural invasion, margins free of tumor, closest approach is 0.3 cms from posterior soft tissue margin. 54 LN negative. He completed radiation 01/19/2021  Interval History  He is here for FU on his anemia. He is feeling well, taking his iron supplement, was instructed to stop it for 10 days prior to colonoscopy.  Colonoscopy currently scheduled at the end of August.  He denies any new health complaints today, uses his G-tube mostly for feeding, can eat some soft foods. No hematochezia or melena.  He has a follow-up with Dr. Isidore Moos early part of 2023. Rest of the pertinent 10 point ROS reviewed and negative.  MEDICAL HISTORY:  Past Medical History:  Diagnosis Date   Cancer [facility])    tongue    SURGICAL HISTORY: Past Surgical History:  Procedure Laterality Date   DIRECT LARYNGOSCOPY N/A 09/05/2020   Procedure: DIRECT LARYNGOSCOPY;  Surgeon: Marcina Millard, MD;  Location: [place];  Service: ENT;  Laterality: N/A;   ESOPHAGOSCOPY N/A 09/05/2020   Procedure: ESOPHAGOSCOPY;  Surgeon: Marcina Millard, MD;  Location: [place];  Service: ENT;  Laterality: N/A;   FRACTURE SURGERY     IR REPLACE G-TUBE SIMPLE WO FLUORO  05/29/2021   TONGUE BIOPSY N/A 09/05/2020   Procedure: TONGUE BIOPSY;  Surgeon: Marcina Millard, MD;  Location: [place];  Service: ENT;  Laterality: N/A;    SOCIAL HISTORY: Social History   Socioeconomic History   Marital status: Single    Spouse name: Not on file   Number of children: Not on file   Years of education: Not on file   Highest education level: Not on file  Occupational History   Not on file  Tobacco Use   Smoking status: Former Smoker    Packs/day:  1PPD for 25 yrs    Types: Cigarettes   Smokeless tobacco: Never Used  Scientific laboratory technician Use: Never used  Substance and Sexual Activity   Alcohol use: Not Currently   Drug use: Yes    Types: Marijuana   Sexual activity: Not Currently  Other Topics Concern   Not on file  Social History Narrative   Not on file   Social Determinants of Health   Financial Resource Strain: Not on file  Food Insecurity: Not on file  Transportation Needs: Not on file  Physical Activity: Not on file  Stress: Not on file  Social Connections: Not on file  Intimate Partner Violence: Not on file    FAMILY HISTORY: Family History  Problem Relation Age of Onset   Lung cancer Mother    Stroke Father    Lung cancer Sister    Lung cancer Maternal Grandmother     ALLERGIES:  has No Known Allergies.  MEDICATIONS:  Current Outpatient Medications  Medication Sig Dispense Refill   ferrous sulfate 325 (65 FE) MG EC tablet Take 1 tablet (325  mg total) by mouth 2 (two) times daily with a meal. 60 tablet 2   Nutritional Supplements (FEEDING SUPPLEMENT, OSMOLITE 1.5 CAL,) LIQD Increase Osmolite 1.5 to 8 cartons daily with 60 mL free water before and after each bolus feeding. Feed via low profile gastrostomy tube. Provides 100% estimated needs. 2840 kcal, 119 gm pro. 1896 mL 0   SUPREP BOWEL PREP KIT 17.5-3.13-1.6 GM/177ML SOLN Take 1 kit by mouth as directed. For colonoscopy prep 354 mL 0   No current facility-administered medications for this visit.     PHYSICAL EXAMINATION: ECOG PERFORMANCE STATUS: 0 - Asymptomatic  Vitals:   06/01/21 1019  BP: 135/84  Pulse: 81  Resp: 18  Temp: (!) 97.1 F (36.2 C)  SpO2: 100%   Filed Weights   06/01/21 1019  Weight: 146 lb 14.4 oz (66.6 kg)   Physical Exam Constitutional:      Appearance: Normal appearance. He is normal weight.  HENT:     Head: Normocephalic and atraumatic.  Eyes:     Extraocular Movements: Extraocular movements intact.   Cardiovascular:     Rate and Rhythm: Normal rate and regular rhythm.     Pulses: Normal pulses.  Pulmonary:     Effort: Pulmonary effort is normal.     Breath sounds: Normal breath sounds.  Abdominal:     General: Abdomen is flat. Bowel sounds are normal.  Musculoskeletal:        General: No swelling. Normal range of motion.     Cervical back: Normal range of motion and neck supple. No rigidity.  Lymphadenopathy:     Cervical: No cervical adenopathy.  Skin:    General: Skin is warm and dry.  Neurological:     General: No focal deficit present.     Mental Status: He is alert.  Psychiatric:        Mood and Affect: Mood normal.        Behavior: Behavior normal.    LABORATORY DATA:  I have reviewed the data as listed Lab Results  Component Value Date   WBC 3.8 (L) 06/01/2021   HGB 12.9 (L) 06/01/2021   HCT 38.7 (L) 06/01/2021   MCV 86.6 06/01/2021   PLT 215 06/01/2021     Chemistry      Component Value Date/Time   NA 140 06/01/2021 1057   K 4.1 06/01/2021 1057   CL 101 06/01/2021 1057   CO2 31 06/01/2021 1057   BUN 13 06/01/2021 1057   CREATININE 0.74 06/01/2021 1057      Component Value Date/Time   CALCIUM 10.3 06/01/2021 1057   ALKPHOS 73 11/09/2018 1126   AST 57 (H) 11/09/2018 1126   ALT 34 11/09/2018 1126   BILITOT 1.9 (H) 11/09/2018 1126     I have reviewed pertinent labs. Labs from today show improvement of white blood cell count, hemoglobin has been pretty stable, significantly improved compared to February 2022.  Normal platelet count.  BMP is normal. Mag normal.  RADIOGRAPHIC STUDIES: I have personally reviewed the radiological images as listed and agreed with the findings in the report.  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 ANESTHESIA/SEDATION: NONE. CONTRAST:  10 cc-administered into the gastric lumen. FLUOROSCOPY TIME:  Fluoroscopy Time: 0 minutes 12 seconds (2 mGy). COMPLICATIONS: None immediate. PROCEDURE: Informed written consent was obtained  from the patient after a thorough discussion of the procedural risks, benefits and alternatives. All questions were addressed. Maximal Sterile Barrier Technique was utilized including caps, mask, sterile gowns, sterile gloves, sterile drape, hand hygiene and skin antiseptic. A timeout was performed prior to the initiation of the procedure. Under sterile conditions, the existing left upper quadrant percutaneous tract was cannulated with a 5 [place] Kumpe catheter. Contrast injection confirms position in the stomach. Amplatz guidewire inserted followed by placement of an 53 French mic gastrostomy. Retention balloon inflated with 7 cc saline. Contrast injection confirms position. Images obtained for documentation. Feeding tube ready for use. IMPRESSION: Successful fluoroscopic replacement of the 81 French mic gastrostomy. Ready for use. Electronically Signed   By: Jerilynn Mages.  Shick M.D.   On: 05/29/2021 09:35    All questions were answered. The patient knows to call the clinic with any problems, questions or concerns. I spent 20 min in the care of this patient including H and P, review of records, counseling and coordination of care.     Benay Pike, MD 06/01/2021 12:41 PM

## 2021-06-01 NOTE — Progress Notes (Signed)
Nutrition Follow-up:  Patient has completed treatment for tongue cancer.   Noted patient seen in ED 7/31 due to accidentally dislodging G-tube after catching tube on the toilet seat. G-tube successfully replaced in IR.  Met with patient in clinic. He reports doing well, denies nutrition impact symptoms. Patient reports he is in need of tube feeding supplies, says IR used his replacement Mic-Key tube and does not have anymore at home. Patient reports tube is functioning normally. He continues to give 8 cartons Osmolite 1.5 via PEG with 60 ml free water flush before and after bolus feedings 4 times daily. He reports giving additional 240 ml water 3-4 times/day. Patient has tried pureed foods with SLP, he is not started eating orally at home.   8 cartons Osmolite 1.5+ free water flushes provides 2840 cal, 119 g protein, 2968 mL free water.   Anthropometrics: Patient weighed 146.6 lb in clinic today.   7/22 - 144 lb 7/18 - 145.6 lb 6/21 - 143.8 lb  Estimated Energy Needs  Kcals: 2500-2800 Protein: 100-120 Fluid: 2.8 L  NUTRITION DIAGNOSIS: Unintended weight loss stable    INTERVENTION:  Continue 8 cartons Osmolite 1.5 with free water flushes as above provides 100% estimated nutrition needs.  Continue NPO, diet advancement per SLP  Call placed to [employer] for tube feeding supplies   MONITORING, EVALUATION, GOAL: weight trends, tube feeding, oral intake per SLP recommendations   NEXT VISIT: To be scheduled

## 2021-06-02 LAB — IRON AND TIBC
Iron: 90 ug/dL (ref 42–163)
Saturation Ratios: 21 % (ref 20–55)
TIBC: 426 ug/dL — ABNORMAL HIGH (ref 202–409)
UIBC: 337 ug/dL (ref 117–376)

## 2021-06-02 LAB — FERRITIN: Ferritin: 46 ng/mL (ref 24–336)

## 2021-06-08 ENCOUNTER — Ambulatory Visit (INDEPENDENT_AMBULATORY_CARE_PROVIDER_SITE_OTHER): Payer: Medicaid Other | Admitting: Dentistry

## 2021-06-08 ENCOUNTER — Other Ambulatory Visit: Payer: Self-pay

## 2021-06-08 ENCOUNTER — Encounter (HOSPITAL_COMMUNITY): Payer: Self-pay | Admitting: Dentistry

## 2021-06-08 DIAGNOSIS — K036 Deposits [accretions] on teeth: Secondary | ICD-10-CM

## 2021-06-08 DIAGNOSIS — K0530 Chronic periodontitis, unspecified: Secondary | ICD-10-CM

## 2021-06-08 DIAGNOSIS — K08109 Complete loss of teeth, unspecified cause, unspecified class: Secondary | ICD-10-CM

## 2021-06-08 DIAGNOSIS — K117 Disturbances of salivary secretion: Secondary | ICD-10-CM

## 2021-06-08 DIAGNOSIS — C023 Malignant neoplasm of anterior two-thirds of tongue, part unspecified: Secondary | ICD-10-CM

## 2021-06-08 DIAGNOSIS — R252 Cramp and spasm: Secondary | ICD-10-CM | POA: Diagnosis not present

## 2021-06-08 DIAGNOSIS — Z0120 Encounter for dental examination and cleaning without abnormal findings: Secondary | ICD-10-CM

## 2021-06-08 DIAGNOSIS — K0889 Other specified disorders of teeth and supporting structures: Secondary | ICD-10-CM | POA: Diagnosis not present

## 2021-06-08 DIAGNOSIS — K029 Dental caries, unspecified: Secondary | ICD-10-CM

## 2021-06-08 DIAGNOSIS — Y842 Radiological procedure and radiotherapy as the cause of abnormal reaction of the patient, or of later complication, without mention of misadventure at the time of the procedure: Secondary | ICD-10-CM

## 2021-06-08 DIAGNOSIS — Z5189 Encounter for other specified aftercare: Secondary | ICD-10-CM

## 2021-06-08 NOTE — Progress Notes (Signed)
Department of Dental Medicine      NEW PATIENT EXAM  Service Date:   06/08/2021  Patient Name:   [name] Date of Birth:   [birth year] Medical Record Number: [id number]  Referring Provider:                   Eppie Gibson, M.D.         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       PROGRESS NOTE:   COVID-19 SCREENING:  The patient denies symptoms concerning for COVID-19 infection including fever, chills, cough, or newly developed shortness of breath.   HISTORY OF PRESENT ILLNESS: [name] is a very pleasant 45 y.o. 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   DENTAL HISTORY: The patient reports that he continues to do well following his radiotherapy and has no side effects like dry mouth or limited jaw opening that are bothering him.  He is managing the side effects he does have very well.  He currently denies any dental/orofacial pain or sensitivity. Patient is able to manage oral secretions.  Patient denies dysphagia, odynophagia, dysphonia, SOB and neck pain.  Patient denies fever, rigors and malaise.   CHIEF COMPLAINT: Here for a comprehensive dental exam.   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   Tongue carcinoma ([place])    Past Medical History:  Diagnosis Date   Cancer ([place])    tongue   Past Surgical History:  Procedure Laterality Date   DIRECT LARYNGOSCOPY N/A 09/05/2020   Procedure: DIRECT LARYNGOSCOPY;  Surgeon: Marcina Millard, MD;  Location: [place];  Service: ENT;  Laterality: N/A;   ESOPHAGOSCOPY N/A 09/05/2020   Procedure: ESOPHAGOSCOPY;  Surgeon: Marcina Millard, MD;  Location: [place];  Service: ENT;  Laterality: N/A;   FRACTURE SURGERY     IR REPLACE G-TUBE SIMPLE WO FLUORO  05/29/2021   TONGUE BIOPSY N/A 09/05/2020   Procedure: TONGUE BIOPSY;  Surgeon: Marcina Millard, MD;  Location: [place];  Service: ENT;  Laterality: N/A;   No Known Allergies Current Outpatient Medications  Medication Sig Dispense Refill   ferrous sulfate 325 (65 FE) MG EC tablet Take 1 tablet (325 mg total) by mouth 2 (two) times daily with a meal. 60 tablet 2   Nutritional Supplements (FEEDING SUPPLEMENT, OSMOLITE 1.5 CAL,) LIQD Increase Osmolite 1.5 to 8 cartons daily with 60 mL free water before and after each bolus feeding. Feed via low profile gastrostomy tube. Provides 100% estimated needs. 2840 kcal, 119 gm pro. 1896 mL 0   SUPREP BOWEL  PREP KIT 17.5-3.13-1.6 GM/177ML SOLN Take 1 kit by mouth as directed. For colonoscopy prep 354 mL 0   No current facility-administered medications for this visit.    LABS: Lab Results  Component Value Date   WBC 3.8 (L) 06/01/2021   HGB 12.9 (L) 06/01/2021   HCT 38.7 (L) 06/01/2021   MCV 86.6 06/01/2021   PLT 215 06/01/2021      Component Value Date/Time   NA 140 06/01/2021 1057   K 4.1 06/01/2021 1057   CL 101 06/01/2021 1057   CO2 31 06/01/2021 1057   GLUCOSE 98 06/01/2021 1057   BUN 13 06/01/2021 1057   CREATININE 0.74 06/01/2021 1057   CALCIUM 10.3  06/01/2021 1057   GFRNONAA >60 06/01/2021 1057   GFRAA >60 11/09/2018 1126   No results found for: INR, PROTIME No results found for: PTT  Social History   Socioeconomic History   Marital status: Single    Spouse name: Not on file   Number of children: Not on file   Years of education: Not on file   Highest education level: Not on file  Occupational History   Not on file  Tobacco Use   Smoking status: Former    Packs/day: 0.50    Types: Cigarettes   Smokeless tobacco: Never  Vaping Use   Vaping Use: Never used  Substance and Sexual Activity   Alcohol use: Not Currently   Drug use: Yes    Types: Marijuana   Sexual activity: Not Currently  Other Topics Concern   Not on file  Social History Narrative   Not on file   Social Determinants of Health   Financial Resource Strain: Not on file  Food Insecurity: Not on file  Transportation Needs: No Transportation Needs   Lack of Transportation (Medical): No   Lack of Transportation (Non-Medical): No  Physical Activity: Not on file  Stress: Not on file  Social Connections: Not on file  Intimate Partner Violence: Not on file   Family History  Problem Relation Age of Onset   Lung cancer Mother    Stroke Father    Lung cancer Sister    Lung cancer Maternal Grandmother     REVIEW OF SYSTEMS:  Reviewed with the patient as per HPI. Psych: Patient denies having dental phobia.   VITAL SIGNS: BP (!) 149/90 (BP Location: Right Arm, Patient Position: Sitting, Cuff Size: Normal)   Pulse 84   Temp 98.4 F (36.9 C) (Oral)    PHYSICAL EXAM:  Soft tissue exam completed and charted.  General:  Well-developed, comfortable and in no apparent distress. Neurological:  Alert and oriented to person, place and  time. Extraoral:  Head size, head shape, facial symmetry, skin, complexion, pigmentation, conjunctiva, oral labia, parotid gland, submandibular gland, thyroid, cervical and preauricular lymph nodes, submandibular and  submental lymph nodes, tonsillar and occipital lymph nodes and supraclavicular lymph nodes WNL. No swelling or lymphadenopathy.  TMJ asymptomatic without clicks or crepitations.  (+) Trismus. Maximum Interincisal Opening:  25 mm (measured at dental visit on 03/18/21). Intraoral:  Soft tissues appear well-perfused and mucous membranes moist.  FOM and vestibules soft and not raised. Oral cavity without mass or lesion. No signs of infection, parulis, sinus tract, edema or erythema evident upon exam.  (+) Saliva- thick, viscous.   DENTAL EXAM:  Hard tissue and periodontal exam completed and charted.  Overall impression:  Fair remaining dentition. Oral hygiene:  Poor    Missing:  Teeth numbers 1, 2, 3, 4, 13,  14, 15, 16, 17, 18, 19, 20, 24, 25, 29, 30, 31 and 32 Caries:  Restorable:  #7, #11, #28  Incipient lesions:  #50F(V)&I, #63F(V)&I, #33F(V)&I, #65F(V) Occlusion:  Unable to assess molar occlusion.  Other:  Rotation- #21 rotated mesial Other positive findings:  (+) Attrition- #6-#11 incisal    Periodontal:  Probing depths:  Range between 2-5 mm Plaque:  Moderate Calculus:  Heavy, generalized accumulation Calculus:  Class I- #21, #22, #23, #24    Class II- #5    Class III- #28 Periodontally compromised teeth:  #5, #21, #22, #23, #24 and #28 Gingival appearance:  Mildly inflamed and erythematous tissue; much better appearance than last visit, less sloughing upon cleaning/probing at this visit s/p FMD  Exam performed by [facility] B. Benson Norway, DMD with Leta Speller, DAII acting as scribe.   RADIOGRAPHIC EXAM:  PAN and Full Mouth Series exposed and interpreted on 05/18/21: PAN: Condyles seated bilaterally in fossas.  No evidence of abnormal pathology.  All visualized osseous structures appear WNL. Radiopaque regions at midline of mandible consistent with surgical history. Smaller areas of radiopaque bodies evident bilaterally superimposing on mandible- also consistent with the patient's  surgical history. Evidence of healing extraction sites in the posterior maxilla and mandible. FULL MOUTH SERIES: Generalized moderate to severe horizontal bone loss consistent with moderate to severe periodontitis.  Missing teeth. Caries- #79M. Evidence of wear/attrition on incisal surfaces of #6-#11.   ASSESSMENT:  1.  H/o head and neck radiotherapy 2.  Cancer of anterior two-thirds of tongue 3. New patient dental exam 3.  Accretions on teeth 4.  Missing teeth 5.  Caries 6.  Chronic periodontitis 7.  Loose teeth 8.  Attrition/wear 9.  Trismus 10.  Xerostomia   PROCEDURES: Adult prophylaxis.  Removed all supra- and subgingival calculus and plaque using Cavitron and hand instruments. Polished all teeth.  Flossed. Oral hygiene instruction discussed with the patient.  Encouraged better at-home oral hygiene regimen and the reiterated the importance of maintaining optimal oral health. Toothbrush, toothpaste and floss given to the patient.   PLAN AND RECOMMENDATIONS: I discussed the risks, benefits, and complications of various scenarios with the patient in relationship to their medical and dental conditions.  I explained all significant findings of the dental consultation with the patient including a few smaller cavities and still inflamed and bleeding gums due to plaque build-up, and the recommended care including have the cavities filled to prevent them from getting bigger and returning every 4-6 mos for periodic exams and cleanings in order to optimize them from a dental standpoint.  The patient verbalized understanding of all findings, discussion, and recommendations. We then discussed various treatment options to include no treatment, multiple extractions with alveoloplasty, pre-prosthetic surgery as indicated, periodontal therapy, dental restorations, root canal therapy, crown and bridge therapy, implant therapy, and replacement of missing teeth as indicated.   The patient verbalized  understanding of all options, and currently wishes to proceed with the treatment plan below:  Restorative Recall interval: Prophy- 4-6 mos, Radiographs-  1 year  Plan to discuss all findings and recommendations with medical team and coordinate future care as needed.  Rx:  Chlorhexidine gluconate 2-3x daily for 14 days Next visit:  Restorative per treatment plan   All questions and concerns were invited and addressed.  The patient tolerated today's visit well and departed in stable condition.  I spent in excess of 120 minutes during the conduct of this consultation and >50% of this time involved direct face-to-face encounter for counseling  and/or coordination of the patient's care. [place] Benson Norway, D.M.D.

## 2021-06-10 ENCOUNTER — Ambulatory Visit: Payer: Medicaid Other

## 2021-06-13 MED ORDER — CHLORHEXIDINE GLUCONATE 0.12 % MT SOLN: 15.0000 mL | Freq: Two times a day (BID) | OROMUCOSAL | 0 refills | Status: AC

## 2021-06-14 ENCOUNTER — Ambulatory Visit: Payer: Medicaid Other

## 2021-06-14 ENCOUNTER — Other Ambulatory Visit: Payer: Self-pay

## 2021-06-14 DIAGNOSIS — R471 Dysarthria and anarthria: Secondary | ICD-10-CM | POA: Diagnosis present

## 2021-06-14 DIAGNOSIS — R1312 Dysphagia, oropharyngeal phase: Secondary | ICD-10-CM | POA: Diagnosis present

## 2021-06-14 NOTE — Patient Instructions (Signed)
   Perform oral care (brushing teeth, gums, tongue, roof, and rinsing with mouthwash stooped over the sink) less than 30 minutes before you have water or ice.  If you don't drink water within a half hour of doing this, do it again prior to having any ice or water. You are doing this to prevent any bacteria from entering your lungs if water were to go into your lungs.

## 2021-06-14 NOTE — Therapy (Signed)
[facility] [address] [place], [place], [address] Phone: [phone]   Fax:  [phone]  Speech Language Pathology Treatment  Patient Details  Name: [name] MRN: [id number] Date of Birth: 09-Feb-1976 Referring Provider (SLP): Alex Gibson, MD   Encounter Date: 06/14/2021   End of Session - 06/14/21 1425     Visit Number 9    Number of Visits 13    Date for SLP Re-Evaluation 07/06/21    SLP Start Time 1325   10 minutes late   SLP Stop Time  1400    SLP Time Calculation (min) 35 min    Activity Tolerance Patient tolerated treatment well             Past Medical History:  Diagnosis Date   Cancer ([place])    tongue    Past Surgical History:  Procedure Laterality Date   DIRECT LARYNGOSCOPY N/A 09/05/2020   Procedure: DIRECT LARYNGOSCOPY;  Surgeon: Marcina Millard, MD;  Location: [facility];  Service: ENT;  Laterality: N/A;   ESOPHAGOSCOPY N/A 09/05/2020   Procedure: ESOPHAGOSCOPY;  Surgeon: Marcina Millard, MD;  Location: [facility];  Service: ENT;  Laterality: N/A;   FRACTURE SURGERY     IR REPLACE G-TUBE SIMPLE WO FLUORO  05/29/2021   TONGUE BIOPSY N/A 09/05/2020   Procedure: TONGUE BIOPSY;  Surgeon: Marcina Millard, MD;  Location: [facility];  Service: ENT;  Laterality: N/A;    There were no vitals filed for this visit.   Subjective Assessment - 06/14/21 1422     Subjective Pt has not been doing oral care prior to his PO water at home    Currently in Pain? No/denies                   ADULT SLP TREATMENT - 06/14/21 0001       General Information   Behavior/Cognition Alert;Cooperative;Pleasant mood      Treatment Provided   Treatment provided Dysphagia      Dysphagia Treatment   Temperature Spikes Noted No    Respiratory Status Room air    Oral Cavity - Dentition Missing dentition    Treatment Methods Skilled observation;Compensation strategy training;Upgraded PO texture  trial;Patient/caregiver education    Patient observed directly with PO's Yes    Type of PO's observed Dysphagia 1 (puree)   runny puree   Oral Phase Signs & Symptoms Prolonged bolus formation    Pharyngeal Phase Signs & Symptoms Wet vocal quality;Immediate throat clear;Multiple swallows   suspected premature spillage into   Other treatment/comments Due to "S" statement, SLP again educated (re-educated) pt about necessity and rationale for oral care prior to POs. Pt again states he has done reps of HEP less than suggested/prescribed by SLP. SLP again re-educated pt about why at least 50 reps, with no less than 8 at a time (or more until pt feels fatigue has occurred), is necessary. Pt said he would follow these directions for his HEP. SLP also again explained rationale for this. SLP used syringe pt brought today to place 5-7.5 cc runny puree in posterior oral cavity on lingual surface (dorsum) x10, and pt, after consistent 2-3 swallows, without "wet" voice each time. Pt folowed precautions from [facility] at [facility]. SLP attempted more viscous trial but pt stated more difficult to swallow. Pt took syringe and perfomed x4 trials of runny puree successfully. SLP told pt "I think you have more strength in you if you would do the exercises then  you might be able to have more (e.g., 2 teaspoon bite sizes), and/or be able to swallow something else safely."      Assessment / Recommendations / Plan   Plan Continue with current plan of care   d/cnext sesison if pt has not performed HEP as directed by SLP     Progression Toward Goals   Progression toward goals Not progressing toward goals (comment)   cont to not complete HEP as directed, cont to not perform oral care as directed             SLP Education - 06/14/21 1510     Education Details see "skilled intervention"    Person(s) Educated Patient    Methods Explanation;Demonstration;Handout;Verbal cues    Comprehension Verbalized understanding;Returned  demonstration;Need further instruction;Verbal cues required              SLP Short Term Goals - 02/03/21 1647       SLP SHORT TERM GOAL #1   Title Pt will complete HEP with rare min A over two sessions    Baseline total A; 12-30-20    Period --   sessions, for all STGs   Status Achieved      SLP SHORT TERM GOAL #2   Title Pt will tell SLP 3 overt s/s aspiration PNA with modified independence    Baseline none given    Status Achieved      SLP SHORT TERM GOAL #3   Title Pt will tell SLP rationale for HEP completion    Baseline total A    Status Achieved              SLP Long Term Goals - 06/14/21 1624       SLP LONG TERM GOAL #1   Title Pt will complete HEP with modified independence over 2 visits    Baseline total A;  02/18/21    Period --   sessions, for all LTGs   Status Achieved      SLP LONG TERM GOAL #2   Title pt will perform safety precautions from FEES with PO ice chips with modified independence 2 sessions    Baseline total A    Status Not Met   pt using water - not ice chips     SLP LONG TERM GOAL #3   Title Pt will tell SLP when to decr frequency of HEP    Baseline not provided yet    Time 1    Status Deferred      SLP LONG TERM GOAL #4   Title pt will complete 4 sets of 5 effortful swallows in less than 25 seconds    Time 7    Period Weeks    Status On-going      SLP LONG TERM GOAL #5   Title pt wil perform swallow precautions of the MBSS on 04-25-21 with modified independence in 3 sessions    Time 6    Period Weeks    Status On-going              Plan - 06/14/21 1428     Clinical Impression Statement [name] presents today cont PEG tube dependent following significant oral and phayrngeal surgery in December requiring him to be NPO currently. [name] had follow up FEES at [facility] 04-25-21 - see report for details under Care Everywhere. Fortunately pt cont iwth no overt s/sx aspiration PNA to date. SLP again highly stressed pt's need to  perform oral care before and after  PO water at home as pt stated he was not doing so. See "skiled intervention" for more details of today's session. SLP considering d/c next session if pt does not follow SLP recommendations of number of reps/day, therefore skilled speech therapy MAYcont to be beneficial to the pt in order to regularly assess pt's safety with POs and/or need for instrumental swallow assessment, as well as to assess accurate completion of swallowing HEP.    Speech Therapy Frequency --   approx x2/month   Duration 8 weeks    Treatment/Interventions Aspiration precaution training;Pharyngeal strengthening exercises;Diet toleration management by SLP;Trials of upgraded texture/liquids;Patient/family education;Internal/external aids;SLP instruction and feedback;Compensatory strategies;Other (comment)    Potential to Achieve Goals Fair    Potential Considerations Severity of impairments;Previous level of function    SLP Home Exercise Plan pt rec'd at [facility] after FEES 11-29-20    Consulted and Agree with Plan of Care Patient             Patient will benefit from skilled therapeutic intervention in order to improve the following deficits and impairments:   Dysphagia, oropharyngeal phase  Severe dysarthria    Problem List 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[place]) 11/23/2020   HTN (hypertension) 10/15/2020   Influenza vaccine refused 09/21/2020   Hypertension 09/21/2020   Daily consumption of alcohol 09/13/2020   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

## 2021-06-21 ENCOUNTER — Other Ambulatory Visit: Payer: Self-pay

## 2021-06-22 ENCOUNTER — Ambulatory Visit (INDEPENDENT_AMBULATORY_CARE_PROVIDER_SITE_OTHER): Payer: Medicaid Other | Admitting: Dentistry

## 2021-06-22 ENCOUNTER — Encounter (HOSPITAL_COMMUNITY): Payer: Self-pay | Admitting: Dentistry

## 2021-06-22 DIAGNOSIS — K029 Dental caries, unspecified: Secondary | ICD-10-CM

## 2021-06-22 NOTE — Progress Notes (Signed)
Department of Dental Medicine      RESTORATIVE APPOINTMENT  Service Date:   06/22/2021  Patient Name:   [name] Date of Birth:   November 23, 1975 Medical Record Number: [id number]        TODAY'S VISIT:   Diagnosis:   #6, #7, #28 caries Procedures:   Restorative on teeth numbers 6, 7 and 28  Plan: Next visit:  Continue with restorative per treatment plan       PROGRESS NOTE:   COVID-19 SCREENING:  The patient denies symptoms concerning for COVID-19 infection including fever, chills, cough, or newly developed shortness of breath.   HISTORY OF PRESENT ILLNESS: [name] is a 45 y.o. male who presents today for restorative treatment on teeth numbers 6, 7 and 28 per treatment plan.   CHIEF COMPLAINT:  Here for a routine dental appointment.  Patient with no complaints.  He says that he has been using chlorhexidine rinses as prescribed since his last visit.   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   Tongue carcinoma ([place])    Past Medical History:  Diagnosis Date   Cancer ([place])    tongue   Past Surgical History:  Procedure Laterality Date   DIRECT LARYNGOSCOPY N/A 09/05/2020   Procedure: DIRECT LARYNGOSCOPY;  Surgeon: Marcina Millard, MD;  Location: [facility] OR;  Service: ENT;  Laterality: N/A;   ESOPHAGOSCOPY N/A 09/05/2020   Procedure: ESOPHAGOSCOPY;  Surgeon: Marcina Millard, MD;  Location: [facility];  Service: ENT;  Laterality: N/A;   FRACTURE SURGERY     IR REPLACE G-TUBE SIMPLE WO FLUORO  05/29/2021   TONGUE BIOPSY N/A 09/05/2020   Procedure: TONGUE BIOPSY;  Surgeon: Marcina Millard, MD;  Location: [facility] OR;  Service: ENT;  Laterality: N/A;   No Known  Allergies Current Outpatient Medications  Medication Sig Dispense Refill   amoxicillin-clavulanate (AUGMENTIN) 875-125 MG tablet Take 1 tablet by mouth 2 (two) times daily.     chlorhexidine (PERIDEX) 0.12 % solution 15 mLs by Mouth Rinse route 2 (two) times daily for 14 days. 420 mL 0   ferrous sulfate 325 (65 FE) MG EC tablet Take 1 tablet (325 mg total) by mouth 2 (two) times daily with a meal. 60 tablet 2   Nutritional Supplements (FEEDING SUPPLEMENT, OSMOLITE 1.5 CAL,) LIQD Increase Osmolite 1.5 to 8 cartons daily with 60 mL free water before and after each bolus feeding. Feed via low profile gastrostomy tube. Provides 100% estimated needs. 2840 kcal, 119 gm pro. 1896 mL 0   SUPREP BOWEL PREP KIT 17.5-3.13-1.6 GM/177ML SOLN Take 1 kit by mouth as directed. For colonoscopy prep 354 mL 0   No current facility-administered medications for this visit.    LABS: Lab Results  Component Value Date   WBC 3.8 (L) 06/01/2021   HGB 12.9 (L) 06/01/2021   HCT 38.7 (L) 06/01/2021   MCV 86.6 06/01/2021   PLT 215 06/01/2021      Component Value Date/Time   NA 140 06/01/2021 1057   K 4.1 06/01/2021 1057   CL 101 06/01/2021 1057   CO2 31 06/01/2021 1057   GLUCOSE 98 06/01/2021 1057   BUN 13 06/01/2021 1057   CREATININE 0.74 06/01/2021 1057  CALCIUM 10.3 06/01/2021 1057   GFRNONAA >60 06/01/2021 1057   GFRAA >60 11/09/2018 1126   No results found for: INR, PROTIME No results found for: PTT  Social History   Socioeconomic History   Marital status: Single    Spouse name: Not on file   Number of children: Not on file   Years of education: Not on file   Highest education level: Not on file  Occupational History   Not on file  Tobacco Use   Smoking status: Former    Packs/day: 0.50    Types: Cigarettes   Smokeless tobacco: Never  Vaping Use   Vaping Use: Never used  Substance and Sexual Activity   Alcohol use: Not Currently   Drug use: Yes    Types: Marijuana   Sexual activity:  Not Currently  Other Topics Concern   Not on file  Social History Narrative   Not on file   Social Determinants of Health   Financial Resource Strain: Not on file  Food Insecurity: Not on file  Transportation Needs: No Transportation Needs   Lack of Transportation (Medical): No   Lack of Transportation (Non-Medical): No  Physical Activity: Not on file  Stress: Not on file  Social Connections: Not on file  Intimate Partner Violence: Not on file   Family History  Problem Relation Age of Onset   Lung cancer Mother    Stroke Father    Lung cancer Sister    Lung cancer Maternal Grandmother      VITAL SIGNS: BP (!) 128/99 (BP Location: Right Arm, Patient Position: Sitting, Cuff Size: Normal)   Pulse 92   Temp 98.4 F (36.9 C) (Oral)    ASSESSMENT: Dental caries   PROCEDURES: Restorative treatment on teeth #108F(V), #344F(V) and #28B(V)  Anesthesia: Topical:  Benzocaine 20% applied Type of anesthesia used:  68 mg lidocaine, 0.036 mg epinephrine Location:  #6, #7, #28 infiltration Aspiration negative. Composite restorations: Cotton roll isolation. Excavated decay from teeth numbers 6, 7 and 28.  Of note: tooth #28 is now class 3 mobile and has a hopeless periodontal prognosis. Teeth #108F(V), #344F(V) and #28B(V) prepared for composite. The extent of caries was into dentin. Etched enamel and dentin surfaces with 32% phosphoric acid for 15 seconds and rinsed thoroughly. Removed excess water with a brief burst of air. Optibond bonding agent placed and air dried until no movement of bonding agent was seen and then light cured for 10 seconds. Omnichroma flowable composite material placed in increments and light-cured. Removed excess material with carbide finishing bur. Margins and contacts were verified and adjusted as needed.  Restorations finished and polished. The patient was advised of possible normal sensitivity to hot and cold for the next few days/weeks.  Also advised patient of  #28 prognosis.  He reports no pain  or sensitivity associated with the tooth at this time so we will discuss a definitive treatment plan for tooth at next recall visit unless he becomes symptomatic in between visits.   PLAN:  Next visit:  Restorative per treatment plan  All questions and concerns were invited and addressed.  The patient tolerated today's visit well and departed in stable condition.  Filley Benson [place], D.M.D.

## 2021-06-24 NOTE — Addendum Note (Signed)
Addended by: Cardell Peach I on: 06/24/2021 03:54 PM   Modules accepted: Orders

## 2021-06-27 ENCOUNTER — Ambulatory Visit (HOSPITAL_COMMUNITY)
Admission: RE | Admit: 2021-06-27 | Discharge: 2021-06-27 | Disposition: A | Discharge status: Home / Self Care | Payer: Medicaid Other | Attending: Gastroenterology | Admitting: Gastroenterology

## 2021-06-27 ENCOUNTER — Encounter (HOSPITAL_COMMUNITY)
Admission: RE | Disposition: A | Discharge status: Home / Self Care | Payer: Self-pay | Source: Home / Self Care | Attending: Gastroenterology

## 2021-06-27 ENCOUNTER — Other Ambulatory Visit: Payer: Self-pay | Admitting: Gastroenterology

## 2021-06-27 ENCOUNTER — Encounter (HOSPITAL_COMMUNITY): Payer: Self-pay | Admitting: Gastroenterology

## 2021-06-27 ENCOUNTER — Ambulatory Visit (HOSPITAL_COMMUNITY): Payer: Medicaid Other | Admitting: Certified Registered Nurse Anesthetist

## 2021-06-27 ENCOUNTER — Other Ambulatory Visit: Payer: Self-pay

## 2021-06-27 DIAGNOSIS — D509 Iron deficiency anemia, unspecified: Secondary | ICD-10-CM

## 2021-06-27 DIAGNOSIS — K2980 Duodenitis without bleeding: Secondary | ICD-10-CM | POA: Diagnosis not present

## 2021-06-27 DIAGNOSIS — Z87891 Personal history of nicotine dependence: Secondary | ICD-10-CM | POA: Diagnosis not present

## 2021-06-27 DIAGNOSIS — Z85810 Personal history of malignant neoplasm of tongue: Secondary | ICD-10-CM | POA: Diagnosis not present

## 2021-06-27 DIAGNOSIS — Z931 Gastrostomy status: Secondary | ICD-10-CM | POA: Diagnosis not present

## 2021-06-27 DIAGNOSIS — K5730 Diverticulosis of large intestine without perforation or abscess without bleeding: Secondary | ICD-10-CM | POA: Diagnosis not present

## 2021-06-27 DIAGNOSIS — K449 Diaphragmatic hernia without obstruction or gangrene: Secondary | ICD-10-CM | POA: Diagnosis not present

## 2021-06-27 DIAGNOSIS — K3189 Other diseases of stomach and duodenum: Secondary | ICD-10-CM

## 2021-06-27 DIAGNOSIS — K641 Second degree hemorrhoids: Secondary | ICD-10-CM | POA: Diagnosis not present

## 2021-06-27 DIAGNOSIS — Z801 Family history of malignant neoplasm of trachea, bronchus and lung: Secondary | ICD-10-CM | POA: Diagnosis not present

## 2021-06-27 DIAGNOSIS — Z1211 Encounter for screening for malignant neoplasm of colon: Secondary | ICD-10-CM | POA: Diagnosis present

## 2021-06-27 DIAGNOSIS — K2289 Other specified disease of esophagus: Secondary | ICD-10-CM | POA: Insufficient documentation

## 2021-06-27 DIAGNOSIS — K644 Residual hemorrhoidal skin tags: Secondary | ICD-10-CM | POA: Insufficient documentation

## 2021-06-27 HISTORY — PX: ESOPHAGOGASTRODUODENOSCOPY (EGD) WITH PROPOFOL: SHX5813

## 2021-06-27 HISTORY — PX: BIOPSY: SHX5522

## 2021-06-27 HISTORY — PX: COLONOSCOPY WITH PROPOFOL: SHX5780

## 2021-06-27 SURGERY — COLONOSCOPY WITH PROPOFOL
Anesthesia: Monitor Anesthesia Care

## 2021-06-27 MED ORDER — PROPOFOL 500 MG/50ML IV EMUL
INTRAVENOUS | Status: DC | PRN
  Administered 2021-06-27: 100 ug/kg/min via INTRAVENOUS

## 2021-06-27 MED ORDER — LIDOCAINE 2% (20 MG/ML) 5 ML SYRINGE
INTRAMUSCULAR | Status: DC | PRN
  Administered 2021-06-27: 60 mg via INTRAVENOUS

## 2021-06-27 MED ORDER — LACTATED RINGERS IV SOLN: INTRAVENOUS | Status: DC | PRN

## 2021-06-27 MED ORDER — SODIUM CHLORIDE 0.9 % IV SOLN: INTRAVENOUS | Status: DC

## 2021-06-27 MED ORDER — PROPOFOL 10 MG/ML IV BOLUS
INTRAVENOUS | Status: DC | PRN
  Administered 2021-06-27 (×4): 10 mg via INTRAVENOUS

## 2021-06-27 MED ORDER — ONDANSETRON HCL 4 MG/2ML IJ SOLN
INTRAMUSCULAR | Status: DC | PRN
  Administered 2021-06-27: 4 mg via INTRAVENOUS

## 2021-06-27 MED ORDER — PROPOFOL 1000 MG/100ML IV EMUL
INTRAVENOUS | Status: AC
  Filled 2021-06-27: qty 100

## 2021-06-27 MED ORDER — LACTATED RINGERS IV SOLN: Freq: Once | INTRAVENOUS | Status: AC

## 2021-06-27 MED ORDER — OMEPRAZOLE 2 MG/ML ORAL SUSPENSION: 40.0000 mg | Freq: Every day | ORAL | 12 refills | Status: AC

## 2021-06-27 MED ORDER — PROPOFOL 500 MG/50ML IV EMUL
INTRAVENOUS | Status: AC
  Filled 2021-06-27: qty 50

## 2021-06-27 SURGICAL SUPPLY — 24 items

## 2021-06-27 NOTE — Discharge Instructions (Signed)
YOU HAD AN ENDOSCOPIC PROCEDURE TODAY: Refer to the procedure report and other information in the discharge instructions given to you for any specific questions about what was found during the examination. If this information does not answer your questions, please call [facility] office at [phone] to clarify.  ° °YOU SHOULD EXPECT: Some feelings of bloating in the abdomen. Passage of more gas than usual. Walking can help get rid of the air that was put into your GI tract during the procedure and reduce the bloating. If you had a lower endoscopy (such as a colonoscopy or flexible sigmoidoscopy) you may notice spotting of blood in your stool or on the toilet paper. Some abdominal soreness may be present for a day or two, also. ° °DIET: Your first meal following the procedure should be a light meal and then it is ok to progress to your normal diet. A half-sandwich or bowl of soup is an example of a good first meal. Heavy or fried foods are harder to digest and may make you feel nauseous or bloated. Drink plenty of fluids but you should avoid alcoholic beverages for 24 hours. If you had a esophageal dilation, please see attached instructions for diet.   ° °ACTIVITY: Your care partner should take you home directly after the procedure. You should plan to take it easy, moving slowly for the rest of the day. You can resume normal activity the day after the procedure however YOU SHOULD NOT DRIVE, use power tools, machinery or perform tasks that involve climbing or major physical exertion for 24 hours (because of the sedation medicines used during the test).  ° °SYMPTOMS TO REPORT IMMEDIATELY: °A gastroenterologist can be reached at any hour. Please call [phone]  for any of the following symptoms:  °Following lower endoscopy (colonoscopy, flexible sigmoidoscopy) °Excessive amounts of blood in the stool  °Significant tenderness, worsening of abdominal pains  °Swelling of the abdomen that is new, acute  °Fever of 100° or  higher  °Following upper endoscopy (EGD, EUS, ERCP, esophageal dilation) °Vomiting of blood or coffee ground material  °New, significant abdominal pain  °New, significant chest pain or pain under the shoulder blades  °Painful or persistently difficult swallowing  °New shortness of breath  °Black, tarry-looking or red, bloody stools ° °FOLLOW UP:  °If any biopsies were taken you will be contacted by phone or by letter within the next 1-3 weeks. Call [phone]  if you have not heard about the biopsies in 3 weeks.  °Please also call with any specific questions about appointments or follow up tests. ° °

## 2021-06-27 NOTE — Anesthesia Postprocedure Evaluation (Signed)
Anesthesia Post Note  Patient: [name]  Procedure(s) Performed: COLONOSCOPY WITH PROPOFOL ESOPHAGOGASTRODUODENOSCOPY (EGD) WITH PROPOFOL BIOPSY     Patient location during evaluation: PACU Anesthesia Type: MAC Level of consciousness: awake and alert Pain management: pain level controlled Vital Signs Assessment: post-procedure vital signs reviewed and stable Respiratory status: spontaneous breathing, nonlabored ventilation, respiratory function stable and patient connected to nasal cannula oxygen Cardiovascular status: stable and blood pressure returned to baseline Postop Assessment: no apparent nausea or vomiting Anesthetic complications: no   No notable events documented.  Last Vitals:  Vitals:   06/27/21 0900 06/27/21 0907  BP: (!) 176/93 (!) 174/89  Pulse: (!) 56 (!) 57  Resp: 15 12  Temp:    SpO2: 98% 100%    Last Pain:  Vitals:   06/27/21 0907  TempSrc:   PainSc: 0-No pain                 Keighan Amezcua S

## 2021-06-27 NOTE — Anesthesia Preprocedure Evaluation (Signed)
Anesthesia Evaluation  Patient identified by MRN, date of birth, ID band Patient awake    Reviewed: Allergy & Precautions, NPO status , Patient's Chart, lab work & pertinent test results  Airway Mallampati: III  TM Distance: >3 FB Neck ROM: Full   Comment: Cancer of anterior two-thirds of tongue  Dental no notable dental hx.    Pulmonary neg pulmonary ROS, former smoker,    Pulmonary exam normal breath sounds clear to auscultation       Cardiovascular hypertension, Normal cardiovascular exam Rhythm:Regular Rate:Normal     Neuro/Psych negative neurological ROS  negative psych ROS   GI/Hepatic negative GI ROS, Neg liver ROS,   Endo/Other  negative endocrine ROS  Renal/GU negative Renal ROS  negative genitourinary   Musculoskeletal negative musculoskeletal ROS (+)   Abdominal   Peds negative pediatric ROS (+)  Hematology negative hematology ROS (+)   Anesthesia Other Findings   Reproductive/Obstetrics negative OB ROS                             Anesthesia Physical Anesthesia Plan  ASA: 3  Anesthesia Plan: MAC   Post-op Pain Management:    Induction: Intravenous  PONV Risk Score and Plan: 1 and Propofol infusion and Treatment may vary due to age or medical condition  Airway Management Planned: Simple Face Mask  Additional Equipment:   Intra-op Plan:   Post-operative Plan:   Informed Consent: I have reviewed the patients History and Physical, chart, labs and discussed the procedure including the risks, benefits and alternatives for the proposed anesthesia with the patient or authorized representative who has indicated his/her understanding and acceptance.     Dental advisory given  Plan Discussed with: CRNA and Surgeon  Anesthesia Plan Comments:         Anesthesia Quick Evaluation

## 2021-06-27 NOTE — Op Note (Addendum)
[facility] Patient Name: [name] Procedure Date: 06/27/2021 MRN: [id number] Attending MD: Justice Britain , MD Date of Birth: 02-20-1976 CSN: [id number] Age: 45 Admit Type: Outpatient Procedure:                Upper GI endoscopy Indications:              Iron deficiency anemia Providers:                Justice Britain, MD, Kary Kos RN, RN, Elspeth Cho Tech., Technician, Virgia Land, CRNA Referring MD:             Willia Craze Medicines:                Monitored Anesthesia Care Complications:            No immediate complications. Estimated Blood Loss:     Estimated blood loss was minimal. Procedure:                Pre-Anesthesia Assessment:                           - Prior to the procedure, a History and Physical                            was performed, and patient medications and                            allergies were reviewed. The patient's tolerance of                            previous anesthesia was also reviewed. The risks                            and benefits of the procedure and the sedation                            options and risks were discussed with the patient.                            All questions were answered, and informed consent                            was obtained. Prior Anticoagulants: The patient has                            taken no previous anticoagulant or antiplatelet                            agents. ASA Grade Assessment: III - A patient with                            severe systemic disease. After reviewing the risks  and benefits, the patient was deemed in                            satisfactory condition to undergo the procedure.                           After obtaining informed consent, the endoscope was                            passed under direct vision. Throughout the                            procedure, the patient's blood pressure,  pulse, and                            oxygen saturations were monitored continuously. The                            [id number]-[id number] Olympus endoscope was introduced                            through the mouth, and advanced to the second part                            of duodenum. The upper GI endoscopy was                            accomplished without difficulty. The patient                            tolerated the procedure. Scope In: Scope Out: Findings:      No gross lesions were noted in the entire esophagus.      The Z-line was irregular and was found 40 cm from the incisors.      A 3 cm hiatal hernia was present.      There was evidence of an intact gastrostomy with a patent G-tube present       on the greater curvature of the stomach. This was characterized by       healthy appearing mucosa.      Diffuse severely erythematous mucosa without bleeding was found in the       entire examined stomach otherwise. Biopsies were taken with a cold       forceps for histology and Helicobacter pylori testing.      No gross lesions were noted in the duodenal bulb, in the first portion       of the duodenum and in the second portion of the duodenum. Biopsies for       histology were taken with a cold forceps for evaluation of celiac       disease. Impression:               - No gross lesions in esophagus. Z-line irregular,                            40 cm from the incisors.                           -  3 cm hiatal hernia.                           - Intact gastrostomy with a patent G-tube present                            characterized by healthy appearing mucosa.                           - Erythematous mucosa in the entire stomach.                            Biopsied.                           - No gross lesions in the duodenal bulb, in the                            first portion of the duodenum and in the second                            portion of the duodenum.  Biopsied. Moderate Sedation:      Not Applicable - Patient had care per Anesthesia. Recommendation:           - Proceed to scheduled colonoscopy.                           - Observe patient's clinical course.                           - Await pathology results.                           - Initiate PPI 40 mg daily. Will send Rx as                            Omeprazole liquid to use in G-tube.                           - No overt etiology for dysphagia based on the                            anatomy noted - patient felt he was improving                            slowly - so no empiric dilation or biopsies                            obtained today (though could consider in future if                            necessary).                           - The findings and recommendations were discussed  with the patient. Procedure Code(s):        --- Professional ---                           [phone], Esophagogastroduodenoscopy, flexible,                            transoral; with biopsy, single or multiple Diagnosis Code(s):        --- Professional ---                           K22.8, Other specified diseases of esophagus                           K44.9, Diaphragmatic hernia without obstruction or                            gangrene                           Z93.1, Gastrostomy status                           K31.89, Other diseases of stomach and duodenum                           D50.9, Iron deficiency anemia, unspecified CPT copyright 2019 [employer]. All rights reserved. The codes documented in this report are preliminary and upon coder review may  be revised to meet current compliance requirements. Justice Britain, MD 06/27/2021 8:44:13 AM Number of Addenda: 0

## 2021-06-27 NOTE — Anesthesia Procedure Notes (Signed)
Procedure Name: MAC Date/Time: 06/27/2021 8:01 AM Performed by: Maxwell Caul, CRNA Pre-anesthesia Checklist: Patient identified, Suction available, Emergency Drugs available, Patient being monitored and Timeout performed Patient Re-evaluated:Patient Re-evaluated prior to induction Oxygen Delivery Method: Simple face mask

## 2021-06-27 NOTE — Op Note (Signed)
[facility] Patient Name: [name] Procedure Date: 06/27/2021 MRN: [id number] Attending MD: Justice Britain , MD Date of Birth: 08-08-1976 CSN: [id number] Age: 45 Admit Type: Outpatient Procedure:                Colonoscopy Indications:              Screening for colorectal malignant neoplasm, This                            is the patient's first colonoscopy, Incidental -                            Iron deficiency anemia Providers:                Justice Britain, MD, Kary Kos RN, RN, Elspeth Cho Tech., Technician, Virgia Land, CRNA Referring MD:             Willia Craze Medicines:                Monitored Anesthesia Care Complications:            No immediate complications. Estimated Blood Loss:     Estimated blood loss was minimal. Estimated blood                            loss: none. Procedure:                Pre-Anesthesia Assessment:                           - Prior to the procedure, a History and Physical                            was performed, and patient medications and                            allergies were reviewed. The patient's tolerance of                            previous anesthesia was also reviewed. The risks                            and benefits of the procedure and the sedation                            options and risks were discussed with the patient.                            All questions were answered, and informed consent                            was obtained. Prior Anticoagulants: The patient has  taken no previous anticoagulant or antiplatelet                            agents. ASA Grade Assessment: III - A patient with                            severe systemic disease. After reviewing the risks                            and benefits, the patient was deemed in                            satisfactory condition to undergo the procedure.                            After obtaining informed consent, the colonoscope                            was passed under direct vision. Throughout the                            procedure, the patient's blood pressure, pulse, and                            oxygen saturations were monitored continuously. The                            [id number]-HQ190L ([id number] Olympus colonoscope was                            introduced through the anus and advanced to the the                            cecum, identified by appendiceal orifice and                            ileocecal valve. The colonoscopy was performed                            without difficulty. The patient tolerated the                            procedure. The quality of the bowel preparation was                            adequate. The ileocecal valve, appendiceal orifice,                            and rectum were photographed. Due to short rectum,                            unable to retroflex scope. Scope In: 8:22:50 AM Scope Out: 8:36:02 AM Scope Withdrawal Time: 0 hours 9 minutes 38 seconds  Total Procedure Duration: 0  hours 13 minutes 12 seconds  Findings:      The digital rectal exam findings include hemorrhoids. Pertinent       negatives include no palpable rectal lesions.      Multiple small-mouthed diverticula were found in the entire colon.      Normal mucosa was found in the entire colon otherwise.      Non-bleeding non-thrombosed external and internal hemorrhoids were found       during perianal exam, during digital exam and during endoscopy. The       hemorrhoids were Grade II (internal hemorrhoids that prolapse but reduce       spontaneously). Impression:               - Hemorrhoids found on digital rectal exam.                           - Diverticulosis in the entire examined colon.                           - Normal mucosa in the entire examined colon                            otherwise.                           - Non-bleeding  non-thrombosed external and internal                            hemorrhoids. Moderate Sedation:      Not Applicable - Patient had care per Anesthesia. Recommendation:           - The patient will be observed post-procedure,                            until all discharge criteria are met.                           - Discharge patient to home.                           - Patient has a contact number available for                            emergencies. The signs and symptoms of potential                            delayed complications were discussed with the                            patient. Return to normal activities tomorrow.                            Written discharge instructions were provided to the                            patient.                           -  High fiber diet.                           - Continue present medications.                           - Repeat colonoscopy in 10 years for screening                            purposes.                           - If Iron Deficiency Anemia persists (after                            gastritis is allowed to heal) would consider role                            of VCE though his Hgb has improved when last                            checked. Would keep current dosing of iron at this                            time.                           - The findings and recommendations were discussed                            with the patient. Procedure Code(s):        --- Professional ---                           [phone], Colonoscopy, flexible; diagnostic, including                            collection of specimen(s) by brushing or washing,                            when performed (separate procedure) Diagnosis Code(s):        --- Professional ---                           Z12.11, Encounter for screening for malignant                            neoplasm of colon                           K64.1, Second degree hemorrhoids                            K57.30, Diverticulosis of large intestine without                            perforation or  abscess without bleeding CPT copyright 2019 American [employer] Association. All rights reserved. The codes documented in this report are preliminary and upon coder review may  be revised to meet current compliance requirements. Justice Britain, MD 06/27/2021 8:48:40 AM Number of Addenda: 0

## 2021-06-27 NOTE — Transfer of Care (Signed)
Immediate Anesthesia Transfer of Care Note  Patient: [name]  Procedure(s) Performed: COLONOSCOPY WITH PROPOFOL ESOPHAGOGASTRODUODENOSCOPY (EGD) WITH PROPOFOL BIOPSY  Patient Location: PACU and Endoscopy Unit  Anesthesia Type:MAC  Level of Consciousness: awake, alert  and oriented  Airway & Oxygen Therapy: Patient Spontanous Breathing and Patient connected to face mask oxygen  Post-op Assessment: Report given to RN and Post -op Vital signs reviewed and stable  Post vital signs: Reviewed and stable  Last Vitals:  Vitals Value Taken Time  BP    Temp    Pulse    Resp 22 06/27/21 0842  SpO2    Vitals shown include unvalidated device data.  Last Pain:  Vitals:   06/27/21 0730  TempSrc: Axillary         Complications: No notable events documented.

## 2021-06-27 NOTE — H&P (Signed)
GASTROENTEROLOGY PROCEDURE H&P NOTE   Primary Care Physician: Pcp, No  HPI: [name] is a 45 y.o. male who presents for EGD/Colonoscopy for history of IDA and Colon Cancer Screening.  Past Medical History:  Diagnosis Date   Cancer [facility])    tongue   Past Surgical History:  Procedure Laterality Date   DIRECT LARYNGOSCOPY N/A 09/05/2020   Procedure: DIRECT LARYNGOSCOPY;  Surgeon: Marcina Millard, MD;  Location: [facility] OR;  Service: ENT;  Laterality: N/A;   ESOPHAGOSCOPY N/A 09/05/2020   Procedure: ESOPHAGOSCOPY;  Surgeon: Marcina Millard, MD;  Location: [facility] OR;  Service: ENT;  Laterality: N/A;   FRACTURE SURGERY     IR REPLACE G-TUBE SIMPLE WO FLUORO  05/29/2021   TONGUE BIOPSY N/A 09/05/2020   Procedure: TONGUE BIOPSY;  Surgeon: Marcina Millard, MD;  Location: [place];  Service: ENT;  Laterality: N/A;   Current Facility-Administered Medications  Medication Dose Route Frequency Provider Last Rate Last Admin   0.9 %  sodium chloride infusion   Intravenous Continuous Willia Craze, NP        Current Facility-Administered Medications:    0.9 %  sodium chloride infusion, , Intravenous, Continuous, Chester Holstein, Newell Coral, NP No Known Allergies Family History  Problem Relation Age of Onset   Lung cancer Mother    Stroke Father    Lung cancer Sister    Lung cancer Maternal Grandmother    Social History   Socioeconomic History   Marital status: Single    Spouse name: Not on file   Number of children: Not on file   Years of education: Not on file   Highest education level: Not on file  Occupational History   Not on file  Tobacco Use   Smoking status: Former    Packs/day: 0.50    Types: Cigarettes    Quit date: 07/29/2020    Years since quitting: 0.9   Smokeless tobacco: Never  Vaping Use   Vaping Use: Never used  Substance and Sexual Activity   Alcohol use: Not Currently   Drug use: Not Currently    Types: Marijuana   Sexual activity: Not Currently  Other  Topics Concern   Not on file  Social History Narrative   Not on file   Social Determinants of Health   Financial Resource Strain: Not on file  Food Insecurity: Not on file  Transportation Needs: No Transportation Needs   Lack of Transportation (Medical): No   Lack of Transportation (Non-Medical): No  Physical Activity: Not on file  Stress: Not on file  Social Connections: Not on file  Intimate Partner Violence: Not on file    Physical Exam: Vital signs in last 24 hours: Temp:  [97.9 F (36.6 C)] 97.9 F (36.6 C) (08/29 0730) Pulse Rate:  [75] 75 (08/29 0730) Resp:  [10] 10 (08/29 0730) BP: (162)/(98) 162/98 (08/29 0730) SpO2:  [100 %] 100 % (08/29 0730) Weight:  [65.8 kg] 65.8 kg (08/29 0730)   GEN: NAD EYE: Sclerae anicteric ENT: MMM CV: Non-tachycardic GI: Soft, NT/ND NEURO:  Alert & Oriented x 3  Lab Results: No results for input(s): WBC, HGB, HCT, PLT in the last 72 hours. BMET No results for input(s): NA, K, CL, CO2, GLUCOSE, BUN, CREATININE, CALCIUM in the last 72 hours. LFT No results for input(s): PROT, ALBUMIN, AST, ALT, ALKPHOS, BILITOT, BILIDIR, IBILI in the last 72 hours. PT/INR No results for input(s): LABPROT, INR in the last 72 hours.   Impression /  Plan: This is a 45 y.o.male who presents for EGD/Colonoscopy for history of IDA and Colon Cancer Screening.  The risks and benefits of endoscopic evaluation/treatment were discussed with the patient and/or family; these include but are not limited to the risk of perforation, infection, bleeding, missed lesions, lack of diagnosis, severe illness requiring hospitalization, as well as anesthesia and sedation related illnesses.  The patient's history has been reviewed, patient examined, no change in status, and deemed stable for procedure.  The patient and/or family is agreeable to proceed.    Justice Britain, MD [facility] # [id number]

## 2021-06-28 LAB — SURGICAL PATHOLOGY

## 2021-06-29 ENCOUNTER — Encounter: Payer: Self-pay | Admitting: Gastroenterology

## 2021-07-06 ENCOUNTER — Ambulatory Visit: Payer: Medicaid Other

## 2021-07-06 ENCOUNTER — Encounter (HOSPITAL_COMMUNITY): Payer: Medicaid Other | Admitting: Dentistry

## 2021-07-13 ENCOUNTER — Ambulatory Visit: Payer: Medicaid Other | Attending: Radiation Oncology

## 2021-07-13 ENCOUNTER — Other Ambulatory Visit: Payer: Self-pay

## 2021-07-13 DIAGNOSIS — R1312 Dysphagia, oropharyngeal phase: Secondary | ICD-10-CM | POA: Insufficient documentation

## 2021-07-13 DIAGNOSIS — R471 Dysarthria and anarthria: Secondary | ICD-10-CM | POA: Diagnosis present

## 2021-07-14 NOTE — Therapy (Signed)
[facility] [address] [place], [place], [address] Phone: [phone]   Fax:  [phone]  Speech Language Pathology Treatment  Patient Details  Name: [name] MRN: [id number] Date of Birth: 17-Apr-1976 Referring Provider (SLP): Eppie Gibson, MD   Encounter Date: 07/13/2021   End of Session - 07/14/21 0012     Visit Number 10    Number of Visits 13    Date for SLP Re-Evaluation 10/12/21    Activity Tolerance Patient tolerated treatment well             Past Medical History:  Diagnosis Date   Cancer [facility])    tongue    Past Surgical History:  Procedure Laterality Date   BIOPSY  06/27/2021   Procedure: BIOPSY;  Surgeon: Irving Copas., MD;  Location: [facility];  Service: Gastroenterology;;   COLONOSCOPY WITH PROPOFOL N/A 06/27/2021   Procedure: COLONOSCOPY WITH PROPOFOL;  Surgeon: Irving Copas., MD;  Location: [facility];  Service: Gastroenterology;  Laterality: N/A;   DIRECT LARYNGOSCOPY N/A 09/05/2020   Procedure: DIRECT LARYNGOSCOPY;  Surgeon: Marcina Millard, MD;  Location: [facility];  Service: ENT;  Laterality: N/A;   ESOPHAGOGASTRODUODENOSCOPY (EGD) WITH PROPOFOL N/A 06/27/2021   Procedure: ESOPHAGOGASTRODUODENOSCOPY (EGD) WITH PROPOFOL;  Surgeon: Irving Copas., MD;  Location: [facility];  Service: Gastroenterology;  Laterality: N/A;   ESOPHAGOSCOPY N/A 09/05/2020   Procedure: ESOPHAGOSCOPY;  Surgeon: Marcina Millard, MD;  Location: [facility];  Service: ENT;  Laterality: N/A;   FRACTURE SURGERY     IR REPLACE G-TUBE SIMPLE WO FLUORO  05/29/2021   TONGUE BIOPSY N/A 09/05/2020   Procedure: TONGUE BIOPSY;  Surgeon: Marcina Millard, MD;  Location: [facility];  Service: ENT;  Laterality: N/A;    There were no vitals filed for this visit.   Subjective Assessment - 07/13/21 1455     Subjective Pt arrives today and tells SLP he has had ginger ale. SLP reiterated water as  PO at home is safest liquid, after meticulous oral care.    Currently in Pain? No/denies                   ADULT SLP TREATMENT - 07/14/21 0001       Dysphagia Treatment   Temperature Spikes Noted No    Respiratory Status Room air    Oral Cavity - Dentition Missing dentition    Treatment Methods Skilled observation;Compensation strategy training;Upgraded PO texture trial;Patient/caregiver education    Patient observed directly with PO's Yes    Type of PO's observed --   wet puree   Oral Phase Signs & Symptoms Prolonged bolus formation    Pharyngeal Phase Signs & Symptoms Multiple swallows;Wet vocal quality;Immediate throat clear;Other (comment)   these are precautions for pt as well - pt followed these during POs   Other treatment/comments Pt tells SLP he has done about 30 reps of hard swallow each day, SLP reminded pt about 50 reps/day recommended and no less than 8 at a time. It took pt 45 seconds for 10 swallows. SLP used skilled observation to ascertain "wet" voice after half of these, with a imediate throat clear after 7 of these swallows. SLP encouraged pt that his recommended 50 reps should not take more than 5 minutes during the day. SLP told pt if 5 sets 10 get fairly easy pt should incr reps to 12, 5 times a day. With POs, as grid above explains. Pt took 5-7.5 ml of wet  puree with a syringe x12, following his precautions. Pt has been having water at home, 6-10 sips day reported - not after meticulous oral care. SLP told pt to perfrom meticulous oral care and then take 4 oz. H2O x2/day and x3 if possible, explaining rationale. Pt voiced understanding.      Assessment / Recommendations / Plan   Plan Continue with current plan of care      Progression Toward Goals   Progression toward goals Not progressing toward goals (comment)   cont suboptimal completion of program               SLP Short Term Goals - 02/03/21 1647       SLP SHORT TERM GOAL #1   Title Pt will  complete HEP with rare min A over two sessions    Baseline total A; 12-30-20    Period --   sessions, for all STGs   Status Achieved      SLP SHORT TERM GOAL #2   Title Pt will tell SLP 3 overt s/s aspiration PNA with modified independence    Baseline none given    Status Achieved      SLP SHORT TERM GOAL #3   Title Pt will tell SLP rationale for HEP completion    Baseline total A    Status Achieved              SLP Long Term Goals - 07/14/21 0014       SLP LONG TERM GOAL #1   Title Pt will complete HEP with modified independence over 2 visits    Baseline total A;  02/18/21    Period --   sessions, for all LTGs   Status Achieved      SLP LONG TERM GOAL #2   Title pt will perform safety precautions from FEES with PO ice chips with modified independence 2 sessions    Baseline total A    Status Not Met   pt using water - not ice chips     SLP LONG TERM GOAL #3   Title Pt will tell SLP when to decr frequency of HEP    Baseline not provided yet    Time 1    Status Deferred      SLP LONG TERM GOAL #4   Title pt will complete 4 sets of 5 effortful swallows in less than 25 seconds    Time 7    Period Weeks    Status On-going      SLP LONG TERM GOAL #5   Title pt wil perform swallow precautions of the MBSS on 04-25-21 with modified independence in 3 sessions    Time 6    Period Weeks    Status On-going              Plan - 07/14/21 0013     Clinical Impression Statement [name] presents today cont PEG tube dependent following significant oral and phayrngeal surgery in December requiring him to be NPO currently. [name] had follow up FEES at [facility] 04-25-21 - see report for details under Care Everywhere. Fortunately pt cont iwth no overt s/sx aspiration PNA to date. SLP again highly stressed pt's need to perform oral care before and after PO water at home as pt stated he was not doing so. See "skiled intervention" for more details of today's session. SLP considering d/c  next session if pt does not follow SLP recommendations of number of reps/day, therefore skilled speech therapy MAYcont  to be beneficial to the pt in order to regularly assess pt's safety with POs and/or need for instrumental swallow assessment, as well as to assess accurate completion of swallowing HEP.    Speech Therapy Frequency --   approx x2/month   Duration 8 weeks    Treatment/Interventions Aspiration precaution training;Pharyngeal strengthening exercises;Diet toleration management by SLP;Trials of upgraded texture/liquids;Patient/family education;Internal/external aids;SLP instruction and feedback;Compensatory strategies;Other (comment)    Potential to Achieve Goals Fair    Potential Considerations Severity of impairments;Previous level of function    SLP Home Exercise Plan pt rec'd at [facility] after FEES 11-29-20    Consulted and Agree with Plan of Care Patient             Patient will benefit from skilled therapeutic intervention in order to improve the following deficits and impairments:   Dysphagia, oropharyngeal phase  Severe dysarthria    Problem List 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

## 2021-07-26 ENCOUNTER — Ambulatory Visit: Payer: Medicaid Other

## 2021-07-27 ENCOUNTER — Ambulatory Visit: Payer: Medicaid Other

## 2021-07-28 ENCOUNTER — Ambulatory Visit: Payer: Medicaid Other

## 2021-08-02 ENCOUNTER — Ambulatory Visit: Payer: Medicaid Other | Attending: Radiation Oncology

## 2021-08-02 DIAGNOSIS — R471 Dysarthria and anarthria: Secondary | ICD-10-CM | POA: Insufficient documentation

## 2021-08-02 DIAGNOSIS — R1312 Dysphagia, oropharyngeal phase: Secondary | ICD-10-CM | POA: Insufficient documentation

## 2021-08-05 ENCOUNTER — Ambulatory Visit: Payer: Medicaid Other

## 2021-08-05 ENCOUNTER — Other Ambulatory Visit: Payer: Self-pay

## 2021-08-05 DIAGNOSIS — R1312 Dysphagia, oropharyngeal phase: Secondary | ICD-10-CM

## 2021-08-05 DIAGNOSIS — R471 Dysarthria and anarthria: Secondary | ICD-10-CM | POA: Diagnosis present

## 2021-08-05 NOTE — Therapy (Signed)
[facility] [address] [place], [place], [address] Phone: [phone]   Fax:  [phone]  Speech Language Pathology Treatment  Patient Details  Name: [name] MRN: [id number] Date of Birth: Jan 18, 1976 Referring Provider (SLP): Eppie Gibson, MD   Encounter Date: 08/05/2021   End of Session - 08/05/21 1406     Visit Number 11    Number of Visits 13    Date for SLP Re-Evaluation 10/12/21    SLP Start Time 28    SLP Stop Time  1353    SLP Time Calculation (min) 34 min    Activity Tolerance Patient tolerated treatment well             Past Medical History:  Diagnosis Date   Cancer ([place])    tongue    Past Surgical History:  Procedure Laterality Date   BIOPSY  06/27/2021   Procedure: BIOPSY;  Surgeon: Irving Copas., MD;  Location: [facility];  Service: Gastroenterology;;   COLONOSCOPY WITH PROPOFOL N/A 06/27/2021   Procedure: COLONOSCOPY WITH PROPOFOL;  Surgeon: Irving Copas., MD;  Location: [facility];  Service: Gastroenterology;  Laterality: N/A;   DIRECT LARYNGOSCOPY N/A 09/05/2020   Procedure: DIRECT LARYNGOSCOPY;  Surgeon: Marcina Millard, MD;  Location: [facility];  Service: ENT;  Laterality: N/A;   ESOPHAGOGASTRODUODENOSCOPY (EGD) WITH PROPOFOL N/A 06/27/2021   Procedure: ESOPHAGOGASTRODUODENOSCOPY (EGD) WITH PROPOFOL;  Surgeon: Irving Copas., MD;  [facility];  Service: Gastroenterology;  Laterality: N/A;   ESOPHAGOSCOPY N/A 09/05/2020   Procedure: ESOPHAGOSCOPY;  Surgeon: Marcina Millard, MD;  Location: [facility];  Service: ENT;  Laterality: N/A;   FRACTURE SURGERY     IR REPLACE G-TUBE SIMPLE WO FLUORO  05/29/2021   TONGUE BIOPSY N/A 09/05/2020   Procedure: TONGUE BIOPSY;  Surgeon: Marcina Millard, MD;  Location: [facility];  Service: ENT;  Laterality: N/A;    There were no vitals filed for this visit.   Subjective Assessment - 08/05/21 1327      Subjective Pt had follow up at [facility] with sx - Dr. May. No concerns, per pt. Next visit to sx will include ST.    Currently in Pain? No/denies                   ADULT SLP TREATMENT - 08/05/21 1328       General Information   Behavior/Cognition Alert;Cooperative;Pleasant mood      Dysphagia Treatment   Temperature Spikes Noted No    Respiratory Status Room air    Oral Cavity - Dentition Missing dentition    Treatment Methods Skilled observation;Patient/caregiver education;Compensation strategy training    Oral Phase Signs & Symptoms Prolonged bolus formation   pt slurps oral residue for additional swallows; uses posterior left placement of bolus   Pharyngeal Phase Signs & Symptoms Multiple swallows;Immediate throat clear   wet vocal quality not noted today, after throat clear and reswallow with wet puree   Other treatment/comments Pt reports he does approx 15 hard swallows each day, and is drinking about 6-7 bottles of water daily. No overt s/s  aspiration PNA today. Today SLP worked with pt with precautions and runny puree. Pt followed precuations with initial occasional min-mod cues faded to independent. After 7 boluses pt was indepndent. SLP told pt to do runny puree at home 10-12 bites, daily, using precautions. SLP told pt he was likely not going to progress much further if he cont to choose to do less  than prescribed rep number of HEP. Told pt at LEAST 10 reps 5 times a day were required, and the more he does the more chance he gives himsel for more success. Pt agreed with SLP about this. SLP was aware he needed to clean out his mouth before POs and after POs; He was stopping by bathroom on his way out today to rinse oral cavity, which was best he could do today without toothbrush and other oral care items.      Assessment / Recommendations / Plan   Plan Continue with current plan of care;Goals updated   goal for completing 50 reps effortful swallow/day             SLP  Education - 08/05/21 1406     Education Details see "silled intervention" and "pt instructions"    Person(s) Educated Patient    Methods Explanation;Demonstration;Verbal cues    Comprehension Verbalized understanding;Returned demonstration;Verbal cues required              SLP Short Term Goals - 02/03/21 1647       SLP SHORT TERM GOAL #1   Title Pt will complete HEP with rare min A over two sessions    Baseline total A; 12-30-20    Period --   sessions, for all STGs   Status Achieved      SLP SHORT TERM GOAL #2   Title Pt will tell SLP 3 overt s/s aspiration PNA with modified independence    Baseline none given    Status Achieved      SLP SHORT TERM GOAL #3   Title Pt will tell SLP rationale for HEP completion    Baseline total A    Status Achieved              SLP Long Term Goals - 08/05/21 1421       SLP LONG TERM GOAL #1   Title Pt will complete HEP with modified independence over 2 visits    Baseline total A;  02/18/21    Period --   sessions, for all LTGs   Status Achieved      SLP LONG TERM GOAL #2   Title pt will perform safety precautions from FEES with PO ice chips with modified independence 2 sessions    Baseline total A    Status Not Met   pt using water - not ice chips     SLP LONG TERM GOAL #3   Title Pt will tell SLP when to decr frequency of HEP    Baseline not provided yet    Time 1    Status Deferred      SLP LONG TERM GOAL #4   Title pt will complete 4 sets of 5 effortful swallows in less than 25 seconds    Baseline ~2 sets    Time 6    Period Weeks    Status On-going      SLP LONG TERM GOAL #5   Title pt wil perform swallow precautions of the MBSS on 04-25-21 with modified independence in 3 sessions    Baseline occasional min-mod A; 08-05-21    Time 5    Period Weeks    Status On-going      Additional Long Term Goals   Additional Long Term Goals Yes      SLP LONG TERM GOAL #6   Title pt will perform at least 50 reps of  effortful swallow each day between 2 sessions  Baseline none    Time 5    Period Weeks    Status New              Plan - 08/05/21 1414     Clinical Impression Statement Ole presents today cont PEG tube dependent following significant oral and phayrngeal surgery in December requiring him to be NPO currently. Tobin had follow up FEES at [facility] 04-25-21 - see report for details under Care Everywhere. Fortunately today pt cont iwth no overt s/sx aspiration PNA to date - today he reports drinking 6-7 bottles of water daily. SLP again highly stressed pt's need to perform oral care before and after PO water (and now runny puree) at home. See "skiled intervention" for more details of today's session. Pt is not following SLP recommendations of number of reps/day for his HEP (at [place] 50), and is doing "about 15" - SLP will cont to follow pt primarily to [place] for overt s/sx aspiration PNA and adherence to swallow precautions with POs. Skilled ST cont to be beneficial to the pt in order to regularly assess pt's safety with POs and/or need for instrumental swallow assessment, as well as to assess accurate completion of swallowing HEP. If pt looks safe with runny puree/nectar next 2 sessions, and is indpendent with effortful swallow, SLP to d/c and have pt cont to see SLP at [place] Frequency --   approx x2/month   Duration 8 weeks    Treatment/Interventions Aspiration precaution training;Pharyngeal strengthening exercises;Diet toleration management by SLP;Trials of upgraded texture/liquids;Patient/family education;Internal/external aids;SLP instruction and feedback;Compensatory strategies;Other (comment)    Potential to Achieve Goals Fair    Potential Considerations Severity of impairments;Previous level of function    SLP Home Exercise Plan pt rec'd at [facility] after FEES 11-29-20    Consulted and Agree with Plan of Care Patient             Patient will benefit from  skilled therapeutic intervention in order to improve the following deficits and impairments:   Dysphagia, oropharyngeal phase  Severe dysarthria    Problem List 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[place]) 11/23/2020   HTN (hypertension) 10/15/2020   Influenza vaccine refused 09/21/2020   Hypertension 09/21/2020   Daily consumption of alcohol 09/13/2020   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

## 2021-08-05 NOTE — Patient Instructions (Signed)
  Clean out your mouth, and then do 10-12 bites of runny puree each day. Clean your mouth before and after. Follow all the special precautions you were doing today with me: small bites, 3-4 HARD swallows, hard throat clear and swallow, and then "hock" and spit when you are all done. Then clean out your mouth!  Do at LEAST 50 reps of hard swallow every day. I am not sure you will have a chance to eat more normal food safely if you don't do this.  Keep doing the water too, following those special precautions I said above.

## 2021-08-16 ENCOUNTER — Ambulatory Visit: Payer: Medicaid Other

## 2021-08-22 ENCOUNTER — Other Ambulatory Visit: Payer: Self-pay

## 2021-08-22 ENCOUNTER — Ambulatory Visit: Payer: Medicaid Other

## 2021-08-22 DIAGNOSIS — R1312 Dysphagia, oropharyngeal phase: Secondary | ICD-10-CM

## 2021-08-22 DIAGNOSIS — R471 Dysarthria and anarthria: Secondary | ICD-10-CM

## 2021-08-22 NOTE — Therapy (Signed)
[facility] [address] [place], [place], [address] Phone: [phone]   Fax:  [phone]  Speech Language Pathology Treatment/Discharge summary  Patient Details  Name: [name] MRN: [id number] Date of Birth: Feb 20, 1976 Referring Provider (SLP): Eppie Gibson, MD   Encounter Date: 08/22/2021   End of Session - 08/22/21 1446     Visit Number 12    Number of Visits 13    Date for SLP Re-Evaluation 10/12/21    SLP Start Time 57    SLP Stop Time  1429    SLP Time Calculation (min) 22 min    Activity Tolerance Patient tolerated treatment well             Past Medical History:  Diagnosis Date   Cancer ([place])    tongue    Past Surgical History:  Procedure Laterality Date   BIOPSY  06/27/2021   Procedure: BIOPSY;  Surgeon: Irving Copas., MD;  Location: [facility];  Service: Gastroenterology;;   COLONOSCOPY WITH PROPOFOL N/A 06/27/2021   Procedure: COLONOSCOPY WITH PROPOFOL;  Surgeon: Irving Copas., MD;  Location: [facility];  Service: Gastroenterology;  Laterality: N/A;   DIRECT LARYNGOSCOPY N/A 09/05/2020   Procedure: DIRECT LARYNGOSCOPY;  Surgeon: Marcina Millard, MD;  Location: [facility];  Service: ENT;  Laterality: N/A;   ESOPHAGOGASTRODUODENOSCOPY (EGD) WITH PROPOFOL N/A 06/27/2021   Procedure: ESOPHAGOGASTRODUODENOSCOPY (EGD) WITH PROPOFOL;  Surgeon: Irving Copas., MD;  Location: [facility];  Service: Gastroenterology;  Laterality: N/A;   ESOPHAGOSCOPY N/A 09/05/2020   Procedure: ESOPHAGOSCOPY;  Surgeon: Marcina Millard, MD;  Location: [facility];  Service: ENT;  Laterality: N/A;   FRACTURE SURGERY     IR REPLACE G-TUBE SIMPLE WO FLUORO  05/29/2021   TONGUE BIOPSY N/A 09/05/2020   Procedure: TONGUE BIOPSY;  Surgeon: Marcina Millard, MD;  Location: [place];  Service: ENT;  Laterality: N/A;    There were no vitals filed for this visit.  SPEECH THERAPY DISCHARGE  SUMMARY  Visits from Start of Care: 12  Current functional level related to goals / functional outcomes: See below.   Remaining deficits: Oral and pharyngeal dysphagia, dysarthria   Education / Equipment: HEP, aspiration precautions, overt s/sx aspiration pneumonia, speech and swallowing compensations.   Patient agrees to discharge. Patient goals were partially met. Patient is being discharged due to maximized rehab potential. .      Subjective Assessment - 08/22/21 1406     Subjective "I just deal with it at home. It is what it is. I've been doing a little bit at a time."                   ADULT SLP TREATMENT - 08/22/21 1407       General Information   Behavior/Cognition Alert;Cooperative;Pleasant mood      Treatment Provided   Treatment provided Dysphagia      Dysphagia Treatment   Temperature Spikes Noted No    Respiratory Status Room air    Oral Cavity - Dentition Missing dentition    Treatment Methods Skilled observation;Patient/caregiver education;Compensation strategy training    Oral Phase Signs & Symptoms Prolonged bolus formation    Pharyngeal Phase Signs & Symptoms Multiple swallows;Immediate throat clear   with small (1/3) teaspoons   Other treatment/comments Pt did not perform oral care prior to session today but he has not done this prior to previous PO sessions at home. Pt is not hungry today due to having  tube feed just prior to being picked up for ST today. He gave OK for POs despite not having done oral care prior to ST. He had two spoons of wet puree baby food via bottle and two squirts of wet puree baby food from a pouch. Pt had more throat clearing with teaspoons baby food than squirts. However pt, after completing multiple swallows had clear voice with every bolus. He had to be asked to "hock" and expectorate even though he told SLP he needed to do following POs. When he did so, scant amount of bolus was seen expectorated. SLP reminded pt to hock  and expectorate when he has completed POs at home. SLP suspects pt mihgt not be doing this as he has forgotten to do so in other sessions. [name] stated he is doing POs throughout the day, 3-4 boluses at a time. No overt s/sx aspiration PNA reported nor observed today. Pt told SLP he is still performing approx 15 hard swallows/day - has not improved or incr'd in # reps since last visit.      Assessment / Recommendations / Plan   Plan Discharge SLP treatment due to (comment)   pt stable currently - pt to cont with POs at home and SLP to reassess after next ENT appointment in January. Pt agrees with this plan.     Dysphagia Recommendations   Diet recommendations --   as described from last swallow assessment at [facility]- nectar and runny puree with precautions; however SLP believes pt might not be performing all precautions at home   Medication Administration --   as described after latest swallow assessment at [facility]   Postural Changes and/or Swallow Maneuvers --   as desribed on latest swallow assessment at [facility]     Progression Toward Goals   Progression toward goals Progressing toward goals              SLP Education - 08/22/21 1445     Education Details see "skilled intervention"    Person(s) Educated Patient    Methods Explanation    Comprehension Verbalized understanding              SLP Short Term Goals - 02/03/21 1647       SLP SHORT TERM GOAL #1   Title Pt will complete HEP with rare min A over two sessions    Baseline total A; 12-30-20    Period --   sessions, for all STGs   Status Achieved      SLP SHORT TERM GOAL #2   Title Pt will tell SLP 3 overt s/s aspiration PNA with modified independence    Baseline none given    Status Achieved      SLP SHORT TERM GOAL #3   Title Pt will tell SLP rationale for HEP completion    Baseline total A    Status Achieved              SLP Long Term Goals - 08/22/21 1624       SLP LONG TERM GOAL #1   Title Pt will  complete HEP with modified independence over 2 visits    Baseline total A;  02/18/21    Period --   sessions, for all LTGs   Status Achieved      SLP LONG TERM GOAL #2   Title pt will perform safety precautions from FEES with PO ice chips with modified independence 2 sessions    Baseline total A    Status  Not Met   pt using water - not ice chips     SLP LONG TERM GOAL #3   Title Pt will tell SLP when to decr frequency of HEP    Baseline not provided yet    Time 1    Status Deferred      SLP LONG TERM GOAL #4   Title pt will complete 4 sets of 5 effortful swallows in less than 25 seconds    Baseline ~2 sets    Time 6    Period Weeks    Status Not Met      SLP LONG TERM GOAL #5   Title pt wil perform swallow precautions of the MBSS on 04-25-21 with modified independence in 3 sessions    Baseline occasional min-mod A; 08-05-21    Status Partially Met      SLP LONG TERM GOAL #6   Title pt will perform at least 50 reps of effortful swallow each day between 2 sessions    Baseline none    Status Not Met              Plan - 08/22/21 1615     Clinical Impression Statement [name] presents today cont PEG tube dependent following significant oral and phayrngeal surgery in December requiring him to be NPO currently. [name] had follow up FEES at [facility] 04-25-21 - see report for details under Care Everywhere. Fortunately today pt cont without overt s/sx aspiration PNA to date - today he reports having 3-4 boluses of food multiple times per day and performing oral care prior. Pt did not perform oral care prior to ST appointment today however gives authorization to have POs. He had to be cued to hock and expectorate after POs. He cont to perform 15 effortful swallows/day - considerably less than prescribed. See "skiled intervention" for more details of today's session. Pt is not following SLP recommendations of number of reps/day for his HEP (at [place] [facility]), and continues indpendent with  effortful swallow, and remains safe (no overt s/sx aspiration PNA) with current POs, SLP will d/c and have pt cont to see SLP at [facility]. He wil be d/c'd at this time, and If he should desire he can return to this clinic after his next ENT appointment at [facility] in January for ST follow up. A presecription will be necessary.    Treatment/Interventions Aspiration precaution training;Pharyngeal strengthening exercises;Diet toleration management by SLP;Trials of upgraded texture/liquids;Patient/family education;Internal/external aids;SLP instruction and feedback;Compensatory strategies;Other (comment)    Potential to Achieve Goals Fair    Potential Considerations Severity of impairments;Previous level of function    SLP Home Exercise Plan pt rec'd at [facility] after FEES 11-29-20    Consulted and Agree with Plan of Care Patient             Patient will benefit from skilled therapeutic intervention in order to improve the following deficits and impairments:   Dysphagia, oropharyngeal phase  Severe dysarthria    Problem List 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, [place], [place], [address] Phone:  [phone]   Fax:  [phone]   Name: [name] MRN: [id number] Date of Birth: 04/20/1976

## 2021-08-24 ENCOUNTER — Encounter (HOSPITAL_COMMUNITY): Payer: Medicaid Other | Admitting: Dentistry

## 2021-09-01 ENCOUNTER — Encounter: Payer: Self-pay | Admitting: Nurse Practitioner

## 2021-09-01 ENCOUNTER — Other Ambulatory Visit (HOSPITAL_COMMUNITY)
Admission: RE | Admit: 2021-09-01 | Discharge: 2021-09-01 | Disposition: A | Discharge status: Home / Self Care | Payer: Medicaid Other | Source: Ambulatory Visit | Attending: Nurse Practitioner | Admitting: Nurse Practitioner

## 2021-09-01 ENCOUNTER — Other Ambulatory Visit: Payer: Self-pay

## 2021-09-01 ENCOUNTER — Ambulatory Visit (INDEPENDENT_AMBULATORY_CARE_PROVIDER_SITE_OTHER): Payer: Medicaid Other | Admitting: Nurse Practitioner

## 2021-09-01 VITALS — BP 150/99 | Resp 18

## 2021-09-01 DIAGNOSIS — Z113 Encounter for screening for infections with a predominantly sexual mode of transmission: Secondary | ICD-10-CM | POA: Insufficient documentation

## 2021-09-01 NOTE — Patient Instructions (Signed)
STD screening:  Will order STD screening  Follow up:  Follow up if needed  Preventing Sexually Transmitted Infections, Adult Sexually transmitted infections (STIs) are diseases that are spread from person to person (are contagious). They are spread, or transmitted, through bodily fluids exchanged during sex or sexual contact. These bodily fluids include saliva, semen, blood, vaginal mucus, and urine. STIs are very common among people of all ages. Some common STIs include: Herpes. Hepatitis B. Chlamydia. Gonorrhea. Syphilis. HPV (human papillomavirus). HIV, also called the human immunodeficiency virus. This is the virus that can cause AIDS (acquired immunodeficiency syndrome). Often, people who have these STIs do not have symptoms. Even without symptoms, these infections can be spread from person to person and require treatment. How can these conditions affect me? STIs can be treated, and many STIs can be cured. However, some STIs cannot be cured and will affect you for the rest of your life. Certain STIs may: Require you to take medicine for the rest of your life. Affect your ability to have children (your fertility). Increase your risk for developing another STI or certain serious health conditions. These may include: Cervical cancer. Head and neck cancer. Pelvic inflammatory disease (PID), in women. Organ damage or damage to other parts of your body, if the infection spreads. Cause problems during pregnancy and may be transmitted to the baby during the pregnancy or childbirth. What can increase my risk? You may have an increased risk for developing an STI if: You have unprotected sex. Sex includes oral, vaginal, or anal sex. You have more than one sex partner. You have a sex partner who has multiple sex partners. You have sex with someone who has an STI. You have an STI or you had an STI before. You inject drugs or have a sex partner or partners who inject drugs. What actions  can I take to prevent STIs? The only way to completely prevent STIs is not to have sex of any kind. This is called practicing abstinence. If you are sexually active, you can protect yourself and others by taking these actions to lower your risk of getting an STI: Lifestyle Avoid mixing alcohol, drugs, and sex. Alcohol and drug use can affect your ability to make good decisions and can lead to risky sexual behaviors. Medicines Ask your health care provider about taking pre-exposure prophylaxis (PrEP) to prevent HIV infection. General information  Stay up to date on vaccinations. Certain vaccines can lower your risk of getting certain STIs, such as: Hepatitis A and hepatitis B vaccines. You may have been vaccinated as a young child, but you will likely need a booster shot as a teen or young adult. HPV (human papillomavirus) vaccine. Have only one sex partner (be monogamous) or limit the number of sex partners you have. Use methods that prevent the exchange of body fluids between partners (barrier protection) correctly every time you have sex. Barrier protection can be used during oral, vaginal, or anal sex. Commonly used barrier methods include: Male condom. Male condom. Dental dam. Use a new condom for every sex act from start to finish. Get tested for STIs. Have your partners get tested, too. If you test positive for an STI, follow recommendations from your health care provider about treatment and make sure your sex partners are tested and treated. Birth control pills, injections, implants, and intrauterine devices (IUDs) do not protect against STIs. To prevent both STIs and pregnancy, always use a condom with another form of birth control. Some STIs, such as herpes,  are spread through skin-to-skin contact. A condom may not protect you from getting such STIs. Avoid all sexual contact if you or your partners have herpes and there is an active flare with open sores. Where to find more  information Learn more about STIs from: Centers for Disease Control and Prevention: More information about specific STIs: https://price.info/ Places to get sexual health counseling and treatment for free or at a low cost: gettested.StoreMirror.com.cy U.S. Department of Health and Human Services: [place]s.tn Summary Sexually transmitted infections (STIs) can spread through exchanging bodily fluids during sexual contact. Fluids include saliva, semen, blood, vaginal mucus, and urine. You may have an increased risk for developing an STI if you have unprotected sex. Sex includes oral, vaginal, or anal sex. If you do have sex, limit your number of sex partners and use barrier protection every time you have sex. This information is not intended to replace advice given to you by your health care provider. Make sure you discuss any questions you have with your health care provider. Document Revised: 12/01/2019 Document Reviewed: 12/01/2019 Elsevier Patient Education  2022 [employer].

## 2021-09-01 NOTE — Assessment & Plan Note (Signed)
Will order STD screening  Follow up:  Follow up if needed

## 2021-09-01 NOTE — Progress Notes (Signed)
@  Patient ID: [name], male    DOB: 11/19/1975, 45 y.o.   MRN: [id number]  Chief Complaint  Patient presents with   std screening    Referring provider: Camillia Herter, NP   HPI  Patient presents today for STD testing.  He states that he has had no known exposure to any STD and is having no symptoms but would just like to have STD testing completed.  He would like for [place] to include HIV testing. Denies f/c/s, n/v/d, hemoptysis, PND, chest pain or edema.      No Known Allergies  Immunization History  Administered Date(s) Administered   Influenza,inj,Quad PF,6-35 Mos 10/24/2020   PFIZER Comirnaty(Gray Top)Covid-19 Tri-Sucrose Vaccine 12/14/2020   PFIZER(Purple Top)SARS-COV-2 Vaccination 11/23/2020    Past Medical History:  Diagnosis Date   Cancer ([place])    tongue    Tobacco History: Social History   Tobacco Use  Smoking Status Former   Packs/day: 0.50   Types: Cigarettes   Quit date: 07/29/2020   Years since quitting: 1.0  Smokeless Tobacco Never   Counseling given: Not Answered   Outpatient Encounter Medications as of 09/01/2021  Medication Sig   amoxicillin-clavulanate (AUGMENTIN) 875-125 MG tablet Take 1 tablet by mouth 2 (two) times daily.   ferrous sulfate 325 (65 FE) MG EC tablet Take 1 tablet (325 mg total) by mouth 2 (two) times daily with a meal.   Nutritional Supplements (FEEDING SUPPLEMENT, OSMOLITE 1.5 CAL,) LIQD Increase Osmolite 1.5 to 8 cartons daily with 60 mL free water before and after each bolus feeding. Feed via low profile gastrostomy tube. Provides 100% estimated needs. 2840 kcal, 119 gm pro.   omeprazole (FIRST-OMEPRAZOLE) 2 mg/mL SUSP oral suspension Take 20 mLs (40 mg total) by mouth daily.   No facility-administered encounter medications on file as of 09/01/2021.     Review of Systems  Review of Systems  Constitutional: Negative.   HENT: Negative.    Cardiovascular: Negative.   Gastrointestinal: Negative.    Allergic/Immunologic: Negative.   Neurological: Negative.   Psychiatric/Behavioral: Negative.        Physical Exam  BP (!) 150/99   Resp 18   SpO2 96%   Wt Readings from Last 5 Encounters:  06/27/21 145 lb (65.8 kg)  06/01/21 146 lb 14.4 oz (66.6 kg)  05/20/21 144 lb (65.3 kg)  05/16/21 145 lb 9.6 oz (66 kg)  04/19/21 143 lb 8 oz (65.1 kg)     Physical Exam Vitals and nursing note reviewed.  Constitutional:      General: He is not in acute distress.    Appearance: He is well-developed.  Cardiovascular:     Rate and Rhythm: Normal rate and regular rhythm.  Pulmonary:     Effort: Pulmonary effort is normal.     Breath sounds: Normal breath sounds.  Skin:    General: Skin is warm and dry.  Neurological:     Mental Status: He is alert and oriented to person, place, and time.  Psychiatric:        Mood and Affect: Mood normal.        Behavior: Behavior normal.      Assessment & Plan:   Screening for STD (sexually transmitted disease) Will order STD screening  Follow up:  Follow up if needed     Fenton Foy, NP 09/01/2021

## 2021-09-02 ENCOUNTER — Telehealth: Payer: Self-pay | Admitting: Family

## 2021-09-02 LAB — URINE CYTOLOGY ANCILLARY ONLY
Bacterial Vaginitis-Urine: NEGATIVE
Candida Urine: NEGATIVE
Chlamydia: NEGATIVE
Comment: NEGATIVE
Comment: NEGATIVE
Comment: NORMAL
Neisseria Gonorrhea: NEGATIVE
Trichomonas: NEGATIVE

## 2021-09-02 LAB — HIV ANTIBODY (ROUTINE TESTING W REFLEX): HIV Screen 4th Generation wRfx: NONREACTIVE

## 2021-09-02 NOTE — Telephone Encounter (Signed)
PT is asking to know about the results from 09/01/2021 (HIV testing) Please advise and thank you.

## 2021-10-04 NOTE — Progress Notes (Signed)
Patient ID: [name], male    DOB: 01-25-1976  MRN: [id number]  CC: G Tube  Subjective: [name] is a 45 y.o. male who presents for G tube.   His concerns today include:   Reports noticed last week drainage from G tube. Reports he is cleaning site about every 2 to 3 months. Reports tried to call his Gastroenterology doctor but office will not return his calls. Reports he is planning to report to the Emergency Department after leaving primary care today. Denies nausea, vomiting, stomach pain, and additional red flag symptoms.   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     ferrous sulfate 325 (65 FE) MG EC tablet Take 1 tablet (325 mg total) by mouth 2 (two) times daily with a meal. 60 tablet 2   Nutritional Supplements (FEEDING SUPPLEMENT, OSMOLITE 1.5 CAL,) LIQD Increase Osmolite 1.5 to 8 cartons daily with 60 mL free water before and after each bolus feeding. Feed via low profile gastrostomy tube. Provides 100% estimated needs. 2840 kcal, 119 gm pro. 1896 mL 0   omeprazole (FIRST-OMEPRAZOLE) 2 mg/mL SUSP oral suspension Take 20 mLs (40 mg total) by mouth daily. 300 mL 12   No current facility-administered medications on file prior to visit.    No Known Allergies  Social History   Socioeconomic History   Marital status: Single    Spouse name: Not on file   Number of children: Not on file    Years of education: Not on file   Highest education level: Not on file  Occupational History   Not on file  Tobacco Use   Smoking status: Former    Packs/day: 0.50    Types: Cigarettes    Quit date: 07/29/2020    Years since quitting: 1.1   Smokeless tobacco: Never  Vaping Use   Vaping Use: Never used  Substance and Sexual Activity   Alcohol use: Not Currently   Drug use: Not Currently    Types: Marijuana   Sexual activity: Not Currently  Other Topics Concern   Not on file  Social History Narrative   Not on file   Social Determinants of Health   Financial Resource Strain: Not on file  Food Insecurity: Not on file  Transportation Needs: No Transportation Needs   Lack of Transportation (Medical): No   Lack of Transportation (Non-Medical): No  Physical Activity: Not on file  Stress: Not on file  Social Connections: Not on file  Intimate Partner Violence: Not on file    Family History  Problem Relation Age of Onset   Lung cancer Mother    Stroke Father    Lung cancer Sister    Lung cancer Maternal Grandmother     Past Surgical History:  Procedure Laterality Date   BIOPSY  06/27/2021   Procedure: BIOPSY;  Surgeon: Rush Landmark, Telford Nab., MD;  Location: [facility];  Service: Gastroenterology;;   COLONOSCOPY WITH PROPOFOL  N/A 06/27/2021   Procedure: COLONOSCOPY WITH PROPOFOL;  Surgeon: Rush Landmark Telford Nab., MD;  Location: [facility];  Service: Gastroenterology;  Laterality: N/A;   DIRECT LARYNGOSCOPY N/A 09/05/2020   Procedure: DIRECT LARYNGOSCOPY;  Surgeon: Marcina Millard, MD;  Location: [place];  Service: ENT;  Laterality: N/A;   ESOPHAGOGASTRODUODENOSCOPY (EGD) WITH PROPOFOL N/A 06/27/2021   Procedure: ESOPHAGOGASTRODUODENOSCOPY (EGD) WITH PROPOFOL;  Surgeon: Irving Copas., MD;  Location: [facility];  Service: Gastroenterology;  Laterality: N/A;   ESOPHAGOSCOPY N/A 09/05/2020   Procedure: ESOPHAGOSCOPY;  Surgeon: Marcina Millard, MD;  Location:  [place];  Service: ENT;  Laterality: N/A;   FRACTURE SURGERY     IR REPLACE G-TUBE SIMPLE WO FLUORO  05/29/2021   TONGUE BIOPSY N/A 09/05/2020   Procedure: TONGUE BIOPSY;  Surgeon: Marcina Millard, MD;  Location: [place];  Service: ENT;  Laterality: N/A;    ROS: Review of Systems Negative except as stated above  PHYSICAL EXAM: BP 131/78 (BP Location: Left Arm, Patient Position: Sitting, Cuff Size: Large)   Pulse (!) 105   Temp 98.3 F (36.8 C)   Resp 18   Wt 154 lb 9.6 oz (70.1 kg)   SpO2 98%   BMI 22.50 kg/m   Physical Exam HENT:     Head: Normocephalic and atraumatic.  Eyes:     Extraocular Movements: Extraocular movements intact.     Conjunctiva/sclera: Conjunctivae normal.     Pupils: Pupils are equal, round, and reactive to light.  Cardiovascular:     Rate and Rhythm: Normal rate and regular rhythm.     Pulses: Normal pulses.     Heart sounds: Normal heart sounds.  Pulmonary:     Effort: Pulmonary effort is normal.     Breath sounds: Normal breath sounds.  Abdominal:     Comments: G tube in place. Evidence of erythema and drainage surrounding insertion site.   Musculoskeletal:     Cervical back: Normal range of motion and neck supple.  Neurological:     General: No focal deficit present.     Mental Status: He is alert and oriented to person, place, and time.  Psychiatric:        Mood and Affect: Mood normal.        Behavior: Behavior normal.    ASSESSMENT AND PLAN: 1. Gastrostomy tube in place [facility]): 2. Skin infection at gastrostomy tube site [facility]): - Amoxicillin-Clavulanate as prescribed.  - Referral to Gastroenterology for further evaluation and management.  - Follow-up with primary provider as scheduled.  - amoxicillin-clavulanate (AUGMENTIN) 400-57 MG/5ML suspension; Place 5 mLs (400 mg total) into feeding tube 2 (two) times daily for 7 days.  Dispense: 70 mL; Refill: 0 - Ambulatory referral to Gastroenterology   Patient was given the opportunity to  ask questions.  Patient verbalized understanding of the plan and was able to repeat key elements of the plan. Patient was given clear instructions to go to Emergency Department or return to medical center if symptoms don't improve, worsen, or new problems develop.The patient verbalized understanding.   Orders Placed This Encounter  Procedures   Ambulatory referral to Gastroenterology    Requested Prescriptions   Signed Prescriptions Disp Refills   amoxicillin-clavulanate (AUGMENTIN) 400-57 MG/5ML suspension 70 mL 0    Sig: Place 5 mLs (400 mg total) into feeding tube 2 (two) times daily for 7 days.    Follow-up with primary provider as scheduled.  Camillia Herter, NP

## 2021-10-06 ENCOUNTER — Emergency Department (HOSPITAL_COMMUNITY)
Admission: EM | Admit: 2021-10-06 | Discharge: 2021-10-06 | Disposition: A | Discharge status: Home / Self Care | Payer: Medicaid Other | Attending: Emergency Medicine | Admitting: Emergency Medicine

## 2021-10-06 ENCOUNTER — Ambulatory Visit (INDEPENDENT_AMBULATORY_CARE_PROVIDER_SITE_OTHER): Payer: Medicaid Other | Admitting: Family

## 2021-10-06 ENCOUNTER — Encounter: Payer: Self-pay | Admitting: Family

## 2021-10-06 ENCOUNTER — Encounter (HOSPITAL_COMMUNITY): Payer: Self-pay

## 2021-10-06 ENCOUNTER — Other Ambulatory Visit: Payer: Self-pay

## 2021-10-06 VITALS — BP 131/78 | HR 105 | Temp 98.3°F | Resp 18 | Wt 154.6 lb

## 2021-10-06 DIAGNOSIS — Z79899 Other long term (current) drug therapy: Secondary | ICD-10-CM | POA: Diagnosis not present

## 2021-10-06 DIAGNOSIS — L089 Local infection of the skin and subcutaneous tissue, unspecified: Secondary | ICD-10-CM

## 2021-10-06 DIAGNOSIS — K9422 Gastrostomy infection: Secondary | ICD-10-CM | POA: Diagnosis not present

## 2021-10-06 DIAGNOSIS — Z931 Gastrostomy status: Secondary | ICD-10-CM

## 2021-10-06 DIAGNOSIS — Z87891 Personal history of nicotine dependence: Secondary | ICD-10-CM | POA: Diagnosis not present

## 2021-10-06 DIAGNOSIS — I10 Essential (primary) hypertension: Secondary | ICD-10-CM | POA: Diagnosis not present

## 2021-10-06 DIAGNOSIS — Z431 Encounter for attention to gastrostomy: Secondary | ICD-10-CM | POA: Diagnosis present

## 2021-10-06 DIAGNOSIS — Z85810 Personal history of malignant neoplasm of tongue: Secondary | ICD-10-CM | POA: Diagnosis not present

## 2021-10-06 MED ORDER — AMOXICILLIN-POT CLAVULANATE 400-57 MG/5ML PO SUSR: 400.0000 mg | Freq: Two times a day (BID) | ORAL | 0 refills | Status: AC

## 2021-10-06 NOTE — ED Provider Notes (Signed)
[facility] DEPT Provider Note   CSN: [id number] Arrival date & time: 10/06/21  1148     History Chief Complaint  Patient presents with   Skin Issue    [name] is a 45 y.o. male.  HPI  45 year old male with a history of tongue cancer who presents to the emergency department today for evaluation of his G-tube site.  Patient states that he has had redness around the site for the last several weeks.  He denies any significant pain to the area but just states that it feels somewhat irritated.  He does not report any abdominal pain, fevers or vomiting.  He was seen by his PCP prior to arrival who diagnosed him with hypergranulation with concern for possible infection and was given a prescription for antibiotics.  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   Tongue carcinoma ([place])     Past Surgical History:  Procedure Laterality Date   BIOPSY  06/27/2021   Procedure: BIOPSY;  Surgeon: Irving Copas., MD;  Location: [facility];  Service: Gastroenterology;;   COLONOSCOPY WITH PROPOFOL N/A 06/27/2021   Procedure: COLONOSCOPY WITH PROPOFOL;  Surgeon: Irving Copas., MD;  Location: [facility];  Service: Gastroenterology;  Laterality: N/A;   DIRECT LARYNGOSCOPY N/A 09/05/2020   Procedure: DIRECT LARYNGOSCOPY;  Surgeon: Marcina Millard, MD;  Location: [place];  Service: ENT;  Laterality: N/A;   ESOPHAGOGASTRODUODENOSCOPY (EGD) WITH PROPOFOL N/A 06/27/2021   Procedure: ESOPHAGOGASTRODUODENOSCOPY (EGD) WITH  PROPOFOL;  Surgeon: Irving Copas., MD;  Location: [facility];  Service: Gastroenterology;  Laterality: N/A;   ESOPHAGOSCOPY N/A 09/05/2020   Procedure: ESOPHAGOSCOPY;  Surgeon: Marcina Millard, MD;  Location: [facility] OR;  Service: ENT;  Laterality: N/A;   FRACTURE SURGERY     IR REPLACE G-TUBE SIMPLE WO FLUORO  05/29/2021   TONGUE BIOPSY N/A 09/05/2020   Procedure: TONGUE BIOPSY;  Surgeon: Marcina Millard, MD;  Location: [facility] OR;  Service: ENT;  Laterality: N/A;       Family History  Problem Relation Age of Onset   Lung cancer Mother    Stroke Father    Lung cancer Sister    Lung cancer Maternal Grandmother     Social History   Tobacco Use   Smoking status: Former    Packs/day: 0.50    Types: Cigarettes    Quit date: 07/29/2020    Years since quitting: 1.1   Smokeless tobacco: Never  Vaping Use   Vaping Use: Never used  Substance Use Topics   Alcohol use: Not Currently   Drug use: Not Currently    Types: Marijuana    Home Medications Prior to Admission medications   Medication Sig Start Date End Date Taking? Authorizing Provider  amoxicillin-clavulanate (AUGMENTIN) 400-57 MG/5ML suspension Place 5 mLs (400 mg total) into feeding tube 2 (two) times daily for 7 days. 10/06/21 10/13/21  Camillia Herter, NP  amoxicillin-clavulanate (AUGMENTIN) 875-125 MG tablet Take 1 tablet by mouth 2 (two) times daily. 06/10/21   [provider]  ferrous sulfate 325 (  65 FE) MG EC tablet Take 1 tablet (325 mg total) by mouth 2 (two) times daily with a meal. 04/11/21   Benay Pike, MD  Nutritional Supplements (FEEDING SUPPLEMENT, OSMOLITE 1.5 CAL,) LIQD Increase Osmolite 1.5 to 8 cartons daily with 60 mL free water before and after each bolus feeding. Feed via low profile gastrostomy tube. Provides 100% estimated needs. 2840 kcal, 119 gm pro. 02/03/21   Eppie Gibson, MD  omeprazole (FIRST-OMEPRAZOLE) 2 mg/mL SUSP oral suspension Take 20 mLs (40 mg total) by mouth daily.  06/27/21   Mansouraty, Telford Nab., MD    Allergies    Patient has no known allergies.  Review of Systems   Review of Systems  Constitutional:  Negative for fever.  Respiratory:  Negative for shortness of breath.   Cardiovascular:  Negative for chest pain.  Gastrointestinal:  Negative for abdominal pain, constipation, diarrhea, nausea and vomiting.  Musculoskeletal:  Negative for back pain.  Skin:  Positive for color change and wound.  Neurological:  Negative for headaches.   Physical Exam Updated Vital Signs BP (!) 150/99 (BP Location: Left Arm)   Pulse (!) 104   Temp 98 F (36.7 C) (Oral)   Resp 18   Ht 5\' 9"  (1.753 m)   Wt 71.4 kg   SpO2 100%   BMI 23.24 kg/m   Physical Exam Constitutional:      General: He is not in acute distress.    Appearance: He is well-developed.  Eyes:     Conjunctiva/sclera: Conjunctivae normal.  Cardiovascular:     Rate and Rhythm: Normal rate.  Pulmonary:     Effort: Pulmonary effort is normal.  Abdominal:     General: Abdomen is flat. There is no distension.     Palpations: Abdomen is soft.     Tenderness: There is no abdominal tenderness. There is no guarding or rebound.     Comments: Hypergranulation noted around the g-tube site. Small amount of exudate present. No erythema, induration, warmth or fluctuance noted.  Skin:    General: Skin is warm and dry.  Neurological:     Mental Status: He is alert and oriented to person, place, and time.    ED Results / Procedures / Treatments   Labs (all labs ordered are listed, but only abnormal results are displayed) Labs Reviewed - No data to display  EKG None  Radiology No results found.  Procedures Procedures   Medications Ordered in ED Medications - No data to display  ED Course  I have reviewed the triage vital signs and the nursing notes.  Pertinent labs & imaging results that were available during my care of the patient were reviewed by me and considered in my medical  decision making (see chart for details).    MDM Rules/Calculators/A&P                          45 year old male presents to the emergency department today for evaluation of his G-tube site.  He is concerned for possible infection however on exam it appears that he has some chronic granulation tissue present.  I do not see any surrounding erythema warmth tenderness induration or fluctuance to suggest a significant cellulitis or underlying infection.   He was previously seen by his primary care provider prior to arrival who gave a prescription for antibiotics. The skin changes appear to be largely chronic in nature but I do feel that it is probably reasonable to trial a course  of antibiotics given perceived change in wound appearance per patient.  Provided reassurance to patient and advised that he contact his surgeon for reassessment and advised on return precautions.  He voices understanding of plan and reasons to return.  All questions answered peer patient stable for discharge   Final Clinical Impression(s) / ED Diagnoses Final diagnoses:  Attention to G-tube [facility])    Rx / DC Orders ED Discharge Orders     None        Rodney Booze, PA-C 10/06/21 1241    Fredia Sorrow, MD 10/13/21 [phone]

## 2021-10-06 NOTE — Discharge Instructions (Signed)
Please follow up with your primary care provider within 5-7 days for re-evaluation of your symptoms. If you do not have a primary care provider, information for a healthcare clinic has been provided for you to make arrangements for follow up care. Please return to the emergency department for any new or worsening symptoms. ° °

## 2021-10-06 NOTE — ED Triage Notes (Signed)
Pt arrived via POV, granulation tissue around g tube site

## 2021-10-06 NOTE — Progress Notes (Signed)
Pt presents for feeding tube issue, possible hypergranulation per pt states looks to be in infected

## 2021-10-06 NOTE — ED Notes (Signed)
G-tube site dressed w/ non-stick dressing and paper tape. DC papers provided. No questions at this time.

## 2021-10-12 ENCOUNTER — Encounter (HOSPITAL_COMMUNITY): Payer: Medicaid Other | Admitting: Dentistry

## 2021-10-25 ENCOUNTER — Emergency Department (HOSPITAL_COMMUNITY): Payer: Medicaid Other

## 2021-10-25 ENCOUNTER — Other Ambulatory Visit: Payer: Self-pay

## 2021-10-25 ENCOUNTER — Emergency Department (HOSPITAL_COMMUNITY)
Admission: EM | Admit: 2021-10-25 | Discharge: 2021-10-25 | Disposition: A | Discharge status: Home / Self Care | Payer: Medicaid Other | Attending: Emergency Medicine | Admitting: Emergency Medicine

## 2021-10-25 DIAGNOSIS — Z85810 Personal history of malignant neoplasm of tongue: Secondary | ICD-10-CM | POA: Diagnosis not present

## 2021-10-25 DIAGNOSIS — Z87891 Personal history of nicotine dependence: Secondary | ICD-10-CM | POA: Diagnosis not present

## 2021-10-25 DIAGNOSIS — I10 Essential (primary) hypertension: Secondary | ICD-10-CM | POA: Insufficient documentation

## 2021-10-25 DIAGNOSIS — R519 Headache, unspecified: Secondary | ICD-10-CM | POA: Diagnosis present

## 2021-10-25 LAB — COMPREHENSIVE METABOLIC PANEL
ALT: 34 U/L (ref 0–44)
AST: 27 U/L (ref 15–41)
Albumin: 4.2 g/dL (ref 3.5–5.0)
Alkaline Phosphatase: 74 U/L (ref 38–126)
Anion gap: 9 (ref 5–15)
BUN: 12 mg/dL (ref 6–20)
CO2: 27 mmol/L (ref 22–32)
Calcium: 10.1 mg/dL (ref 8.9–10.3)
Chloride: 99 mmol/L (ref 98–111)
Creatinine, Ser: 0.78 mg/dL (ref 0.61–1.24)
GFR, Estimated: >60 mL/min (ref >60)
Glucose, Bld: 123 mg/dL — ABNORMAL HIGH (ref 70–99)
Potassium: 4 mmol/L (ref 3.5–5.1)
Sodium: 135 mmol/L (ref 135–145)
Total Bilirubin: 0.5 mg/dL (ref 0.3–1.2)
Total Protein: 7.7 g/dL (ref 6.5–8.1)

## 2021-10-25 LAB — CBC WITH DIFFERENTIAL/PLATELET
Abs Immature Granulocytes: 0 10*3/uL (ref 0.00–0.07)
Basophils Absolute: 0 10*3/uL (ref 0.0–0.1)
Basophils Relative: 1 %
Eosinophils Absolute: 0 10*3/uL (ref 0.0–0.5)
Eosinophils Relative: 1 %
HCT: 41.9 % (ref 39.0–52.0)
Hemoglobin: 14.1 g/dL (ref 13.0–17.0)
Immature Granulocytes: 0 %
Lymphocytes Relative: 22 %
Lymphs Abs: 1.1 10*3/uL (ref 0.7–4.0)
MCH: 30.3 pg (ref 26.0–34.0)
MCHC: 33.7 g/dL (ref 30.0–36.0)
MCV: 89.9 fL (ref 80.0–100.0)
Monocytes Absolute: 0.5 10*3/uL (ref 0.1–1.0)
Monocytes Relative: 10 %
Neutro Abs: 3.4 10*3/uL (ref 1.7–7.7)
Neutrophils Relative %: 66 %
Platelets: 246 10*3/uL (ref 150–400)
RBC: 4.66 MIL/uL (ref 4.22–5.81)
RDW: 12.4 % (ref 11.5–15.5)
WBC: 5 10*3/uL (ref 4.0–10.5)
nRBC: 0 % (ref 0.0–0.2)

## 2021-10-25 LAB — URINALYSIS, ROUTINE W REFLEX MICROSCOPIC
Bilirubin Urine: NEGATIVE
Glucose, UA: NEGATIVE mg/dL
Hgb urine dipstick: NEGATIVE
Ketones, ur: NEGATIVE mg/dL
Leukocytes,Ua: NEGATIVE
Nitrite: NEGATIVE
Protein, ur: NEGATIVE mg/dL
Specific Gravity, Urine: 1.023 (ref 1.005–1.030)
pH: 7 (ref 5.0–8.0)

## 2021-10-25 LAB — SEDIMENTATION RATE: Sed Rate: 22 mm/hr — ABNORMAL HIGH (ref 0–16)

## 2021-10-25 IMAGING — CT CT HEAD W/O CM
4 series · 16 of 47 positions shown, 18 images · non-contrast
Comparison: None.

CLINICAL DATA: Headache.

EXAM:
CT HEAD WITHOUT CONTRAST
TECHNIQUE: Contiguous axial images were obtained from the base of the skull
through the vertex without intravenous contrast.

[Series 2: head wo · axial · 0.50mm/px · z∈[-158,-38]mm · 7 of 33 slices shown, 9 images]
[im 5/33  brain]
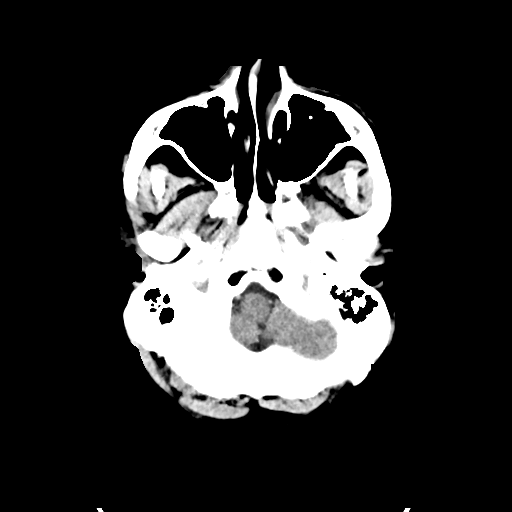
[im 5/33  bone]
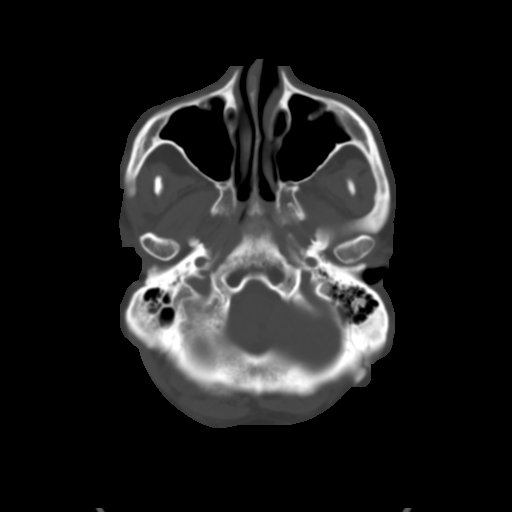
[im 9/33  brain]
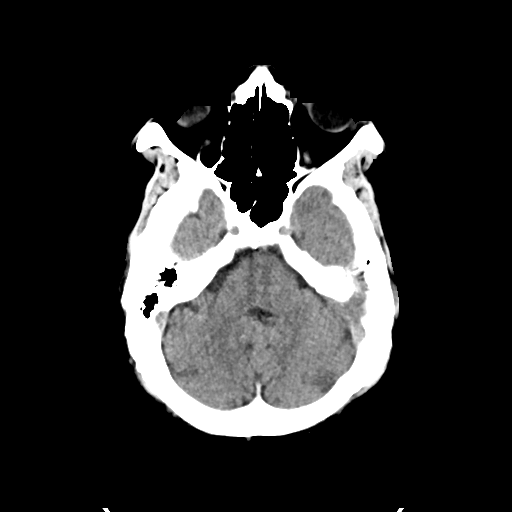
[im 13/33  brain]
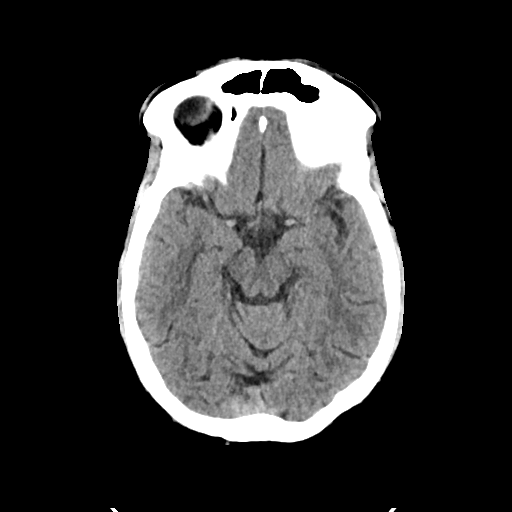
[im 17/33  brain]
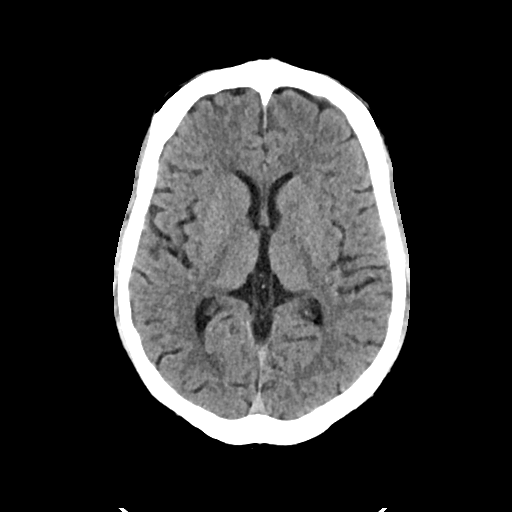
[im 21/33  brain]
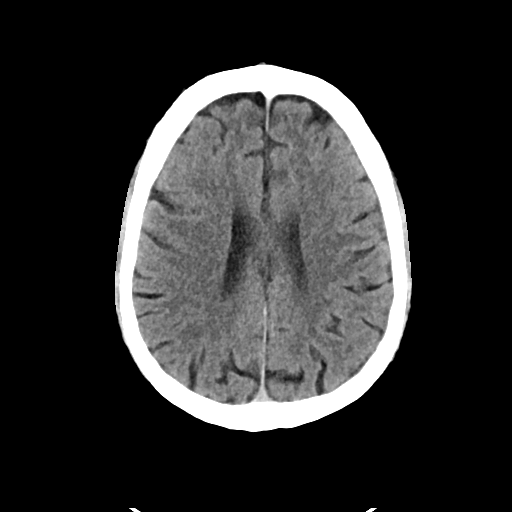
[im 21/33  bone]
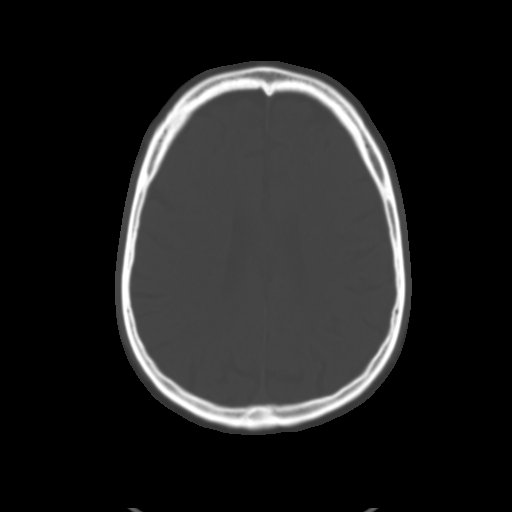
[im 25/33  brain]
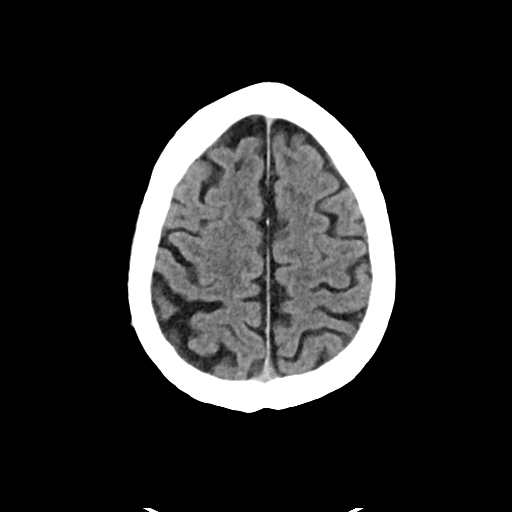
[im 29/33  brain]
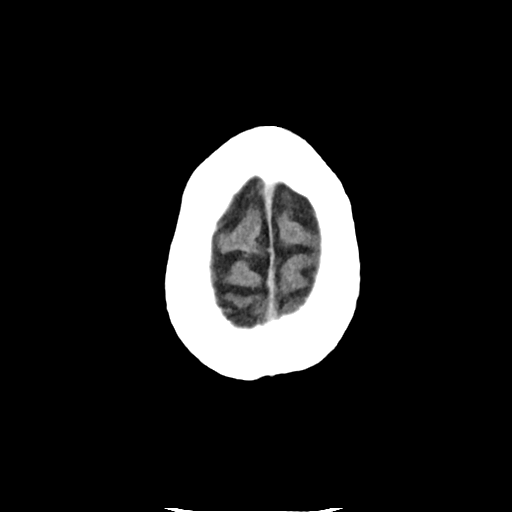

[Series 3: head bone · axial · 0.50mm/px · z∈[-162,-130]mm · 3 of 82 slices shown]
[im 9/82  bone]
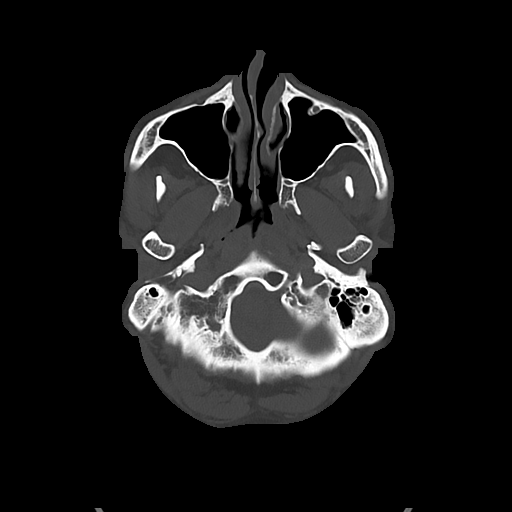
[im 17/82  bone]
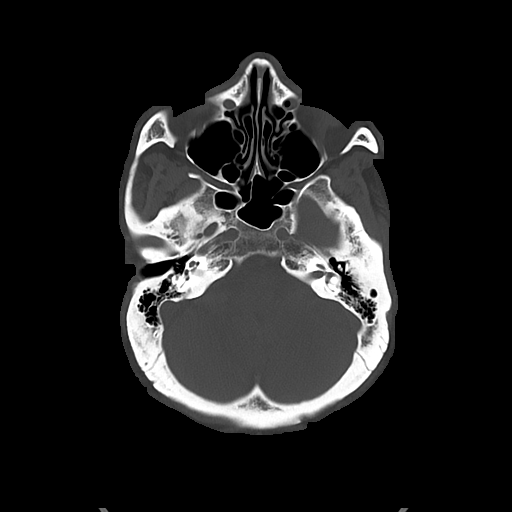
[im 25/82  bone]
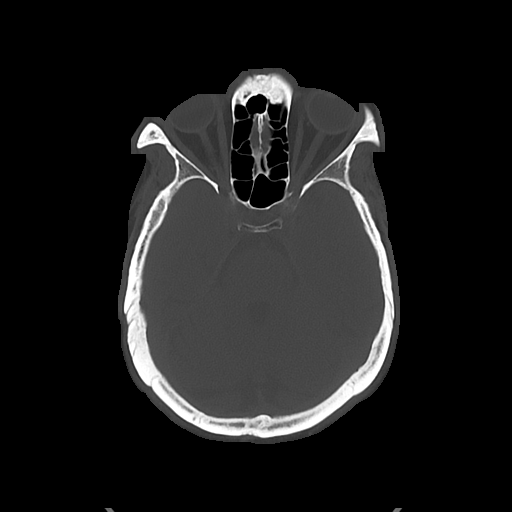

[Series 4: cor soft · coronal · 0.36mm/px · 3 of 79 slices shown]
[im 27/79  brain]
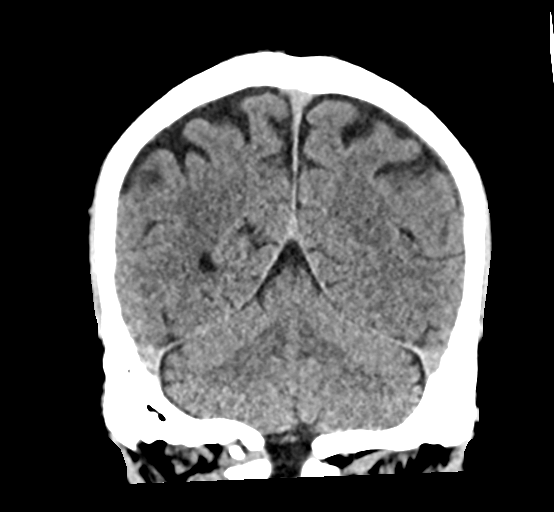
[im 35/79  brain]
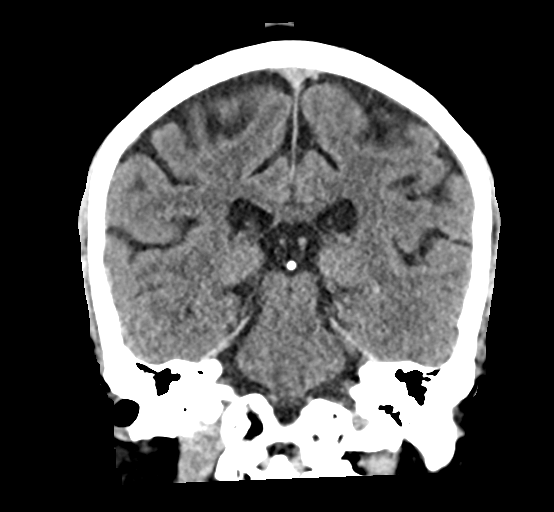
[im 44/79  brain]
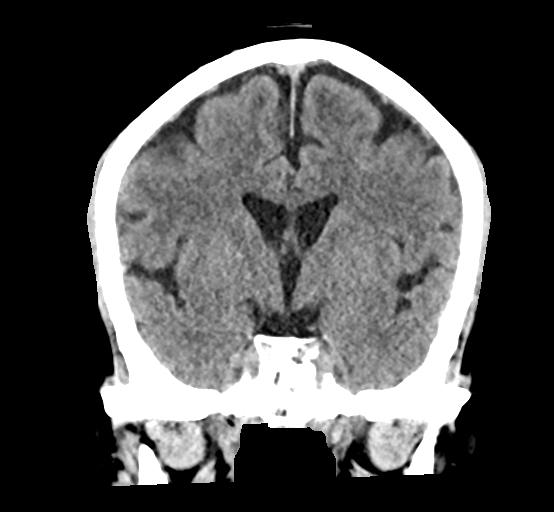

[Series 5: sag soft · sagittal · 0.32mm/px · 3 of 62 slices shown]
[im 21/62  brain]
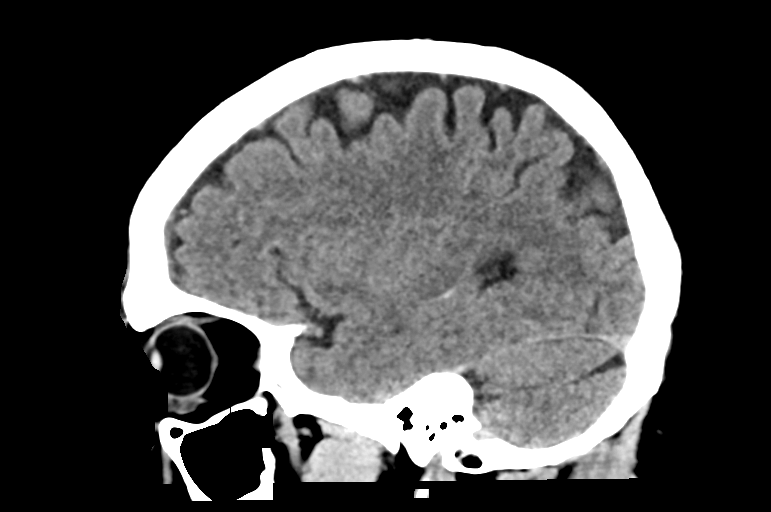
[im 31/62  brain]
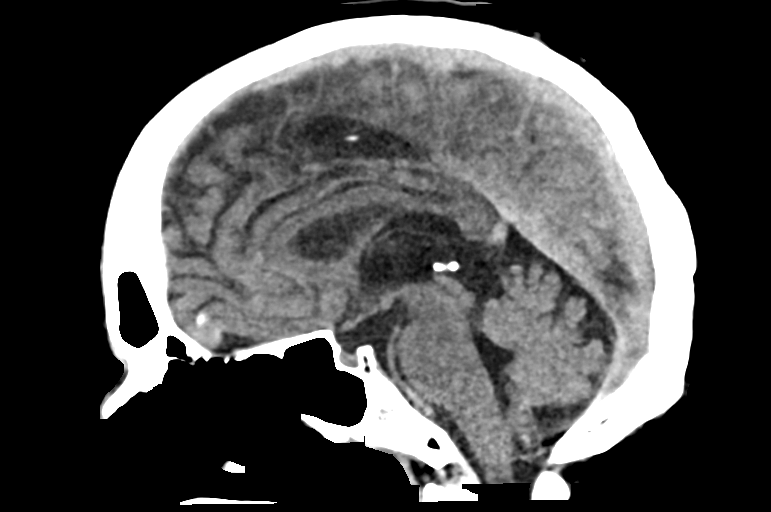
[im 41/62  brain]
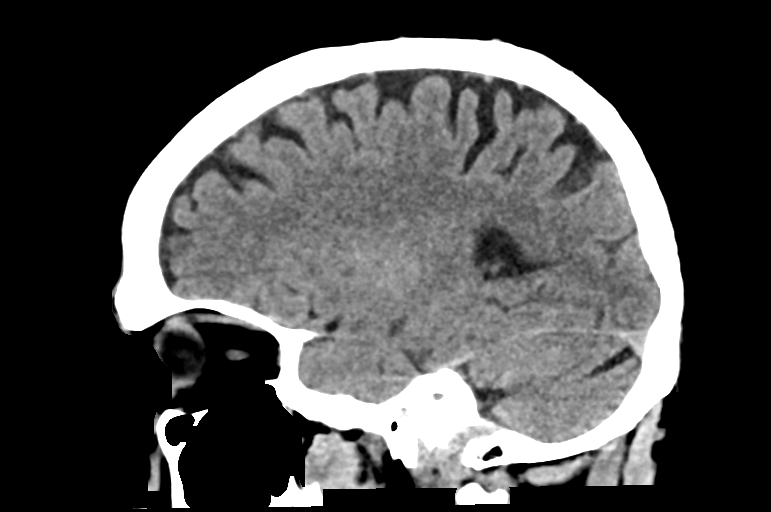

[16 of 47 positions shown; findings below may reference images not displayed]

FINDINGS: Brain: No evidence of acute infarction, hemorrhage, hydrocephalus,
extra-axial collection or mass lesion/mass effect. Mild generalized
cerebral atrophy.

Vascular: No hyperdense vessel or unexpected calcification.

Skull: Normal. Negative for fracture or focal lesion.

Sinuses/Orbits: No acute finding.

Other: None.
IMPRESSION: 1.  No acute intracranial abnormality.

## 2021-10-25 MED ORDER — BUTALBITAL-APAP-CAFFEINE 50-325-40 MG PO TABS: 1.0000 | ORAL_TABLET | Freq: Three times a day (TID) | ORAL | 0 refills | Status: AC | PRN

## 2021-10-25 NOTE — ED Provider Notes (Signed)
[place] EMERGENCY DEPARTMENT Provider Note   CSN: [id number] Arrival date & time: 10/25/21  3818     History Chief Complaint  Patient presents with   Headache    [name] is a 45 y.o. male.  HPI  45 year old male presents emergency department intermittent headache.  Patient states this has been ongoing for the past month.  He describes it as top of his head on the left side.  He describes it as a skin sensitivity feeling.  No previous rash or shingles findings in that area.  He states that the headache is intermittent, does fully resolved, sporadic and not daily.  Never interferes with sleep, is not worse in the mornings.  No associated neuro symptoms.  No history of migraines.  No recent fever, neck pain/stiffness.  Past Medical History:  Diagnosis Date   Cancer 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   Tongue carcinoma ([place])     Past Surgical History:  Procedure Laterality Date   BIOPSY  06/27/2021   Procedure: BIOPSY;  Surgeon: Irving Copas., MD;  Location: [facility];  Service: Gastroenterology;;   COLONOSCOPY WITH PROPOFOL N/A 06/27/2021   Procedure: COLONOSCOPY WITH PROPOFOL;  Surgeon: Irving Copas., MD;  Location: [facility];  Service: Gastroenterology;  Laterality: N/A;   DIRECT LARYNGOSCOPY N/A 09/05/2020   Procedure: DIRECT LARYNGOSCOPY;  Surgeon: Marcina Millard, MD;  Location: [facility];  Service: ENT;  Laterality: N/A;   ESOPHAGOGASTRODUODENOSCOPY (EGD) WITH PROPOFOL N/A 06/27/2021   Procedure: ESOPHAGOGASTRODUODENOSCOPY (EGD) WITH  PROPOFOL;  Surgeon: Irving Copas., MD;  Location: [facility];  Service: Gastroenterology;  Laterality: N/A;   ESOPHAGOSCOPY N/A 09/05/2020   Procedure: ESOPHAGOSCOPY;  Surgeon: Marcina Millard, MD;  Location[facility];  Service: ENT;  Laterality: N/A;   FRACTURE SURGERY     IR REPLACE G-TUBE SIMPLE WO FLUORO  05/29/2021   TONGUE BIOPSY N/A 09/05/2020   Procedure: TONGUE BIOPSY;  Surgeon: Marcina Millard, MD;  Location: [facility] OR;  Service: ENT;  Laterality: N/A;       Family History  Problem Relation Age of Onset   Lung cancer Mother    Stroke Father    Lung cancer Sister    Lung cancer Maternal Grandmother     Social History   Tobacco Use   Smoking status: Former    Packs/day: 0.50    Types: Cigarettes    Quit date: 07/29/2020    Years since quitting: 1.2   Smokeless tobacco: Never  Vaping Use   Vaping Use: Never used  Substance Use Topics   Alcohol use: Not Currently   Drug use: Not Currently    Types: Marijuana    Home Medications Prior to Admission medications   Medication Sig Start Date End Date Taking? Authorizing Provider  butalbital-acetaminophen-caffeine (FIORICET) 50-325-40 MG tablet Take 1 tablet by mouth every 8 (eight) hours as needed for headache. 10/25/21 10/25/22 Yes Zaide Kardell, Alvin Critchley, DO  amoxicillin-clavulanate (AUGMENTIN) 875-125 MG tablet Take 1 tablet by mouth 2 (two) times daily. 06/10/21   [provider]  ferrous sulfate 325 (65 FE) MG  EC tablet Take 1 tablet (325 mg total) by mouth 2 (two) times daily with a meal. 04/11/21   Benay Pike, MD  Nutritional Supplements (FEEDING SUPPLEMENT, OSMOLITE 1.5 CAL,) LIQD Increase Osmolite 1.5 to 8 cartons daily with 60 mL free water before and after each bolus feeding. Feed via low profile gastrostomy tube. Provides 100% estimated needs. 2840 kcal, 119 gm pro. 02/03/21   Eppie Gibson, MD  omeprazole (FIRST-OMEPRAZOLE) 2 mg/mL SUSP oral suspension Take 20 mLs (40 mg total) by mouth daily.  06/27/21   Mansouraty, Telford Nab., MD    Allergies    Patient has no known allergies.  Review of Systems   Review of Systems  Constitutional:  Negative for fever.  HENT:  Negative for congestion.   Eyes:  Negative for visual disturbance.  Respiratory:  Negative for shortness of breath.   Cardiovascular:  Negative for chest pain.  Gastrointestinal:  Negative for abdominal pain, diarrhea and vomiting.  Musculoskeletal:  Negative for neck pain.  Skin:  Negative for rash.  Neurological:  Positive for headaches. Negative for dizziness, seizures, syncope, facial asymmetry, speech difficulty, weakness, light-headedness and numbness.  Psychiatric/Behavioral:  Negative for hallucinations.    Physical Exam Updated Vital Signs BP (!) 144/89 (BP Location: Right Arm)    Pulse 80    Temp 98.3 F (36.8 C) (Oral)    Resp 18    SpO2 99%   Physical Exam Vitals and nursing note reviewed.  Constitutional:      General: He is not in acute distress.    Appearance: Normal appearance. He is not ill-appearing.  HENT:     Head: Normocephalic.     Mouth/Throat:     Mouth: Mucous membranes are moist.  Cardiovascular:     Rate and Rhythm: Normal rate.  Pulmonary:     Effort: Pulmonary effort is normal. No respiratory distress.  Abdominal:     Palpations: Abdomen is soft.     Tenderness: There is no abdominal tenderness.  Skin:    General: Skin is warm.  Neurological:     Mental Status: He is alert and oriented to person, place, and time. Mental status is at baseline.     Cranial Nerves: No cranial nerve deficit or facial asymmetry.     Coordination: Romberg sign negative. Coordination normal.  Psychiatric:        Mood and Affect: Mood normal.    ED Results / Procedures / Treatments   Labs (all labs ordered are listed, but only abnormal results are displayed) Labs Reviewed  SEDIMENTATION RATE - Abnormal; Notable for the following components:      Result Value   Sed Rate 22 (*)    All other  components within normal limits  COMPREHENSIVE METABOLIC PANEL - Abnormal; Notable for the following components:   Glucose, Bld 123 (*)    All other components within normal limits  URINALYSIS, ROUTINE W REFLEX MICROSCOPIC - Abnormal; Notable for the following components:   APPearance CLOUDY (*)    All other components within normal limits  CBC WITH DIFFERENTIAL/PLATELET    EKG None  Radiology CT Head Wo Contrast  Result Date: 10/25/2021 CLINICAL DATA:  Headache. EXAM: CT HEAD WITHOUT CONTRAST TECHNIQUE: Contiguous axial images were obtained from the base of the skull through the vertex without intravenous contrast. COMPARISON:  None. FINDINGS: Brain: No evidence of acute infarction, hemorrhage, hydrocephalus, extra-axial collection or mass lesion/mass effect. Mild generalized cerebral atrophy. Vascular: No hyperdense vessel or unexpected calcification. Skull: Normal. Negative for fracture  or focal lesion. Sinuses/Orbits: No acute finding. Other: None. IMPRESSION: 1.  No acute intracranial abnormality. Electronically Signed   By: Titus Dubin M.D.   On: 10/25/2021 10:30    Procedures Procedures   Medications Ordered in ED Medications - No data to display  ED Course  I have reviewed the triage vital signs and the nursing notes.  Pertinent labs & imaging results that were available during my care of the patient were reviewed by me and considered in my medical decision making (see chart for details).    MDM Rules/Calculators/A&P                          45 year old male presents the emergency department with intermittent headache for the past month.  Currently the headache is resolved.  Vitals are stable, he is afebrile.  He is neurologically intact and baseline.  Describes the headache as left-sided, top of his head, sometimes worse with touching of the skin of his scalp.  No lesions or findings of shingles.  Work appears reassuring, head CT is unremarkable.  Low suspicion for  vascular abnormality.  This may be a component of migraines or other unilateral type headache.  Plan to trial outpatient medications with neurology follow-up.  No other acute or concerning findings to warrant further emergent neuroimaging or admission.  No signs of TIA/CVA.  Patient at this time appears safe and stable for discharge and will be treated as an outpatient.  Discharge plan and strict return to ED precautions discussed, patient verbalizes understanding and agreement.     Final Clinical Impression(s) / ED Diagnoses Final diagnoses:  Nonintractable headache, unspecified chronicity pattern, unspecified headache type    Rx / DC Orders ED Discharge Orders          Ordered    butalbital-acetaminophen-caffeine (FIORICET) 50-325-40 MG tablet  Every 8 hours PRN        10/25/21 1736             Lorelle Gibbs, DO 10/25/21 1911

## 2021-10-25 NOTE — ED Triage Notes (Signed)
Pt reports headache without injury x 1 month. Reports the top of his head is sore when he touches it. Taking tylenol without relief. Denies weakness or other neurological deficits.

## 2021-10-25 NOTE — Discharge Instructions (Signed)
You have been seen and discharged from the emergency department.  You are suffering from headaches.  Your CAT scan here was unremarkable.  However you will still need evaluation and diagnosis by neurology, hopefully for preventative measures.  You may try the new medication for headache as needed.  Do not mix this medication with alcohol or other sedating medications. Do not drive or do heavy physical activity until you know how this medication affects you.  It may cause drowsiness.  Follow-up with your primary provider for reevaluation and further care. Take home medications as prescribed. If you have any worsening symptoms or further concerns for your health please return to an emergency department for further evaluation.

## 2021-10-25 NOTE — ED Provider Notes (Signed)
Emergency Medicine Provider Triage Evaluation Note  [name] , a 45 y.o. male  was evaluated in triage.  Pt complains of intermittent headache for the past month.  Patient reports his scalp is tender to touch whenever he is brushing his hair.  He denies any trauma to the area.  He is been told that he has high blood pressure, but has not taken his medications in over a year.  Denies any blurry vision or weakness.  History of tongue carcinoma with removal..  Review of Systems  Positive: Headache  Negative: Blurry vision, neck pain  Physical Exam  BP (!) 135/97 (BP Location: Right Arm)    Pulse (!) 117    Temp (!) 97.5 F (36.4 C) (Oral)    Resp 14    SpO2 100%  Gen:   Awake, no distress   Resp:  Normal effort  MSK:   Moves extremities without difficulty  Other:  Cranial nerves II-XI intact. Unable to assess XII as the patient does not have a tongue. Superior scalp tender, no deformities or step offs noted. No tenderness over temples.   Medical Decision Making  Medically screening exam initiated at 9:49 AM.  Appropriate orders placed.  [name] was informed that the remainder of the evaluation will be completed by another provider, this initial triage assessment does not replace that evaluation, and the importance of remaining in the ED until their evaluation is complete.  Head CT and labs ordered   [name], Hershal Coria 10/25/21 1017    Truddie Hidden, MD 10/25/21 [phone]

## 2021-10-25 NOTE — ED Notes (Signed)
Pt stated he already used the bathroom

## 2021-10-26 ENCOUNTER — Encounter: Payer: Self-pay | Admitting: Neurology

## 2021-11-23 ENCOUNTER — Encounter (HOSPITAL_COMMUNITY): Payer: Medicaid Other | Admitting: Dentistry

## 2021-11-30 NOTE — Progress Notes (Signed)
NEUROLOGY CONSULTATION NOTE  [name] MRN: [id number] DOB: 13-Mar-1976  Referring provider: Lavenia Atlas, DO (ED referral) Primary care provider: Durene Fruits, NP  Reason for consult:  headache  Assessment/Plan:   Daily persistent headache - unclear etiology.  May be tension-type.  Does not appear to be cervicogenic and not consistent with migraine.  MRI of brain with and without contrast Start amitriptyline 12.5mg  at bedtime for one week, then increase to 25mg  at bedtime Limit use of pain relievers (Advil, Tylenol) to no more than 2 days out of week to prevent risk of rebound or medication-overuse headache. Keep headache diary Follow up in 6 months.   Subjective:  [name] is a 46 year old male with history of tongue cancer s/p resection with G-tube who presents for headache.  History supplemented by ED Note.  Scalp feels sore to touch left parietal/occipital 5/10 - few minutes until massage same- advil/tylenol daily -   He started getting headaches in November.  No preceding event such as trauma or illness.  He describes a persistent 5/10 pressure over the left parietal-occipital region.  Scalp is also sore to touch.  Massaging it makes it resolve after a few minutes but then headache returns.  No associated neck pain, scalp paresthesias, visual disturbance, nausea, vomiting, dizziness, photophobia, phonophobia, or weakness.  He was evaluated in the ED on 10/25/2021 where CT head personally reviewed was unremarkable.  He has been treating with Tylenol or Advil daily which helps.  No prior history of headache.       PAST MEDICAL HISTORY: Past Medical History:  Diagnosis Date   Cancer [facility])    tongue    PAST SURGICAL HISTORY: Past Surgical History:  Procedure Laterality Date   BIOPSY  06/27/2021   Procedure: BIOPSY;  Surgeon: Rush Landmark Alex Nab., MD;  Location: [facility];  Service: Gastroenterology;;   COLONOSCOPY WITH PROPOFOL N/A 06/27/2021    Procedure: COLONOSCOPY WITH PROPOFOL;  Surgeon: Irving Copas., MD;  Location: [facility];  Service: Gastroenterology;  Laterality: N/A;   DIRECT LARYNGOSCOPY N/A 09/05/2020   Procedure: DIRECT LARYNGOSCOPY;  Surgeon: Marcina Millard, MD;  Location: [facility];  Service: ENT;  Laterality: N/A;   ESOPHAGOGASTRODUODENOSCOPY (EGD) WITH PROPOFOL N/A 06/27/2021   Procedure: ESOPHAGOGASTRODUODENOSCOPY (EGD) WITH PROPOFOL;  Surgeon: Irving Copas., MD;  [facility]: [facility];  Service: Gastroenterology;  Laterality: N/A;   ESOPHAGOSCOPY N/A 09/05/2020   Procedure: ESOPHAGOSCOPY;  Surgeon: Marcina Millard, MD;  Location: [facility];  Service: ENT;  Laterality: N/A;   FRACTURE SURGERY     IR REPLACE G-TUBE SIMPLE WO FLUORO  05/29/2021   TONGUE BIOPSY N/A 09/05/2020   Procedure: TONGUE BIOPSY;  Surgeon: Marcina Millard, MD;  Location: [facility];  Service: ENT;  Laterality: N/A;    MEDICATIONS: Current Outpatient Medications on File Prior to Visit  Medication Sig Dispense Refill   amoxicillin-clavulanate (AUGMENTIN) 875-125 MG tablet Take 1 tablet by mouth 2 (two) times daily.     butalbital-acetaminophen-caffeine (FIORICET) 50-325-40 MG tablet Take 1 tablet by mouth every 8 (eight) hours as needed for headache. 10 tablet 0   ferrous sulfate 325 (65 FE) MG EC tablet Take 1 tablet (325 mg total) by mouth 2 (two) times daily with a meal. 60 tablet 2   Nutritional Supplements (FEEDING SUPPLEMENT, OSMOLITE 1.5 CAL,) LIQD Increase Osmolite 1.5 to 8 cartons daily with 60 mL free water before and after each bolus feeding. Feed via low profile gastrostomy tube. Provides 100% estimated needs.  2840 kcal, 119 gm pro. 1896 mL 0   omeprazole (FIRST-OMEPRAZOLE) 2 mg/mL SUSP oral suspension Take 20 mLs (40 mg total) by mouth daily. 300 mL 12   No current facility-administered medications on file prior to visit.    ALLERGIES: No Known Allergies  FAMILY HISTORY: Family History  Problem Relation Age  of Onset   Lung cancer Mother    Stroke Father    Lung cancer Sister    Lung cancer Maternal Grandmother     Objective:  Blood pressure (!) 135/91, pulse (!) 105, height 5\' 9"  (1.753 m), weight 155 lb (70.3 kg), SpO2 100 %. General: No acute distress.  Patient appears well-groomed.   Head:  Normocephalic/atraumatic Eyes:  fundi examined but not visualized Neck: supple, no paraspinal tenderness, full range of motion Back: No paraspinal tenderness Heart: regular rate and rhythm Lungs: Clear to auscultation bilaterally. Vascular: No carotid bruits. Neurological Exam: Mental status: alert and oriented to person, place, and time, recent and remote memory intact, fund of knowledge intact, attention and concentration intact, speech fluent and not dysarthric, language intact. Cranial nerves: CN I: not tested CN II: pupils equal, round and reactive to light, visual fields intact CN III, IV, VI:  full range of motion, no nystagmus, no ptosis CN V: facial sensation intact. CN VII: upper and lower face symmetric CN VIII: hearing intact CN IX, X: deferred as per patient CN XI: sternocleidomastoid and trapezius muscles intact CN XII: unable to assess Bulk & Tone: normal, no fasciculations. Motor:  muscle strength 5/5 throughout Sensation:  Pinprick, temperature and vibratory sensation intact. Deep Tendon Reflexes:  2+ throughout,  toes downgoing.   Finger to nose testing:  Without dysmetria.   Heel to shin:  Without dysmetria.   Gait:  Normal station and stride.  Romberg negative.    Thank you for allowing me to take part in the care of this patient.  Metta Clines, DO  CC:  Durene Fruits, NP

## 2021-12-01 ENCOUNTER — Encounter: Payer: Self-pay | Admitting: Neurology

## 2021-12-01 ENCOUNTER — Ambulatory Visit (INDEPENDENT_AMBULATORY_CARE_PROVIDER_SITE_OTHER): Payer: Medicaid Other | Admitting: Neurology

## 2021-12-01 ENCOUNTER — Other Ambulatory Visit: Payer: Self-pay

## 2021-12-01 VITALS — BP 135/91 | HR 105 | Ht 69.0 in | Wt 155.0 lb

## 2021-12-01 DIAGNOSIS — G4452 New daily persistent headache (NDPH): Secondary | ICD-10-CM

## 2021-12-01 MED ORDER — AMITRIPTYLINE HCL 25 MG PO TABS: ORAL_TABLET | ORAL | 5 refills | Status: DC

## 2021-12-01 NOTE — Patient Instructions (Addendum)
Start amitriptyline 25mg  - take 1/2 tablet at bedtime for one week, then increase to 1 tablet at bedtime.  We can increase dose in 2 months if needed Limit use of pain relievers to no more than 2 days out of week to prevent risk of rebound or medication-overuse headache. Keep headache diary Check MRI of brain with and without contrast.We have sent a referral to [facility] for your MRI and they will call you directly to schedule your appointment. They are located at [place]. If you need to contact them directly please call [phone].  Follow up 6 months.

## 2021-12-07 ENCOUNTER — Telehealth: Payer: Self-pay

## 2021-12-07 NOTE — Telephone Encounter (Signed)
Offer patient appointment to further discuss.

## 2021-12-07 NOTE — Telephone Encounter (Signed)
Patient called asking for a letter from Dr. Isidore Moos stating that he qualifies and would benefit from depression medication. Informed patient that since Dr. Isidore Moos is patient's radiation oncologist, the matter would more appropriate for his PCP to handle. Patient confirmed that he has PCP's office number and stated he would reach out to them directly. Advised patient to call me back should he have any other questions/concerns. No other needs identified at this time.

## 2021-12-08 NOTE — Progress Notes (Signed)
Virtual Visit via Telephone Note  I connected with [name], on 12/09/2021 at 1:18 PM by telephone due to the COVID-19 pandemic and verified that I am speaking with the correct person using two identifiers.  Due to current restrictions/limitations of in-office visits due to the COVID-19 pandemic, this scheduled clinical appointment was converted to a telehealth visit.   Consent: I discussed the limitations, risks, security and privacy concerns of performing an evaluation and management service by telephone and the availability of in person appointments. I also discussed with the patient that there may be a patient responsible charge related to this service. The patient expressed understanding and agreed to proceed.   Location of Patient: Home  Location of Provider: [facility] [place] participating in Telemedicine visit: [place], NP Elmon Else, [place]   History of Present Illness: Alex Lamoureaux. Sexton is a 46 year-old male who presents for anxiety depression.  ANXIETY/DEPRESSION: Comments: Has a chihuahua and would like a Soil scientist. Declines medication management. Anxious mood: yes  Excessive worrying: yes Irritability: no  Sweating: no Nausea: no Palpitations:no Hyperventilation: no Panic attacks: no Depressed mood: yes Insomnia: sometimes  Fatigue/loss of energy: no Feelings of worthlessness: no Feelings of guilt: no Suicidal ideations, homicidal ideations, self-harm: no  Crying spells: sometimes Recent Stressors/Life Changes: yes   Relationship problems: no   Family stress: no     Financial stress: no    Job stress: no    Recent death/loss: yes mother, uncle, sister    Depression screen [facility] 2/9 12/09/2021 10/06/2021 09/29/2020 09/21/2020  Decreased Interest 2 0 1 0  Down, Depressed, Hopeless 2 0 1 2  PHQ - 2 Score 4 0 2 2  Altered sleeping 1 - 1 0  Tired, decreased energy 1 - 0 0  Change in  appetite 0 - 0 0  Feeling bad or failure about yourself  1 - 0 1  Trouble concentrating 0 - 0 0  Moving slowly or fidgety/restless 0 - 0 1  Suicidal thoughts 0 - 0 0  PHQ-9 Score 7 - 3 4  Difficult doing work/chores Somewhat difficult - - -    Past Medical History:  Diagnosis Date   Cancer ([place])    tongue   No Known Allergies  Current Outpatient Medications on File Prior to Visit  Medication Sig Dispense Refill   amitriptyline (ELAVIL) 25 MG tablet Take 1/2 tablet at bedtime for one week, then increase to 1 tablet at bedtime 30 tablet 5   amoxicillin-clavulanate (AUGMENTIN) 875-125 MG tablet Take 1 tablet by mouth 2 (two) times daily.     butalbital-acetaminophen-caffeine (FIORICET) 50-325-40 MG tablet Take 1 tablet by mouth every 8 (eight) hours as needed for headache. 10 tablet 0   ferrous sulfate 325 (65 FE) MG EC tablet Take 1 tablet (325 mg total) by mouth 2 (two) times daily with a meal. 60 tablet 2   Nutritional Supplements (FEEDING SUPPLEMENT, OSMOLITE 1.5 CAL,) LIQD Increase Osmolite 1.5 to 8 cartons daily with 60 mL free water before and after each bolus feeding. Feed via low profile gastrostomy tube. Provides 100% estimated needs. 2840 kcal, 119 gm pro. 1896 mL 0   omeprazole (FIRST-OMEPRAZOLE) 2 mg/mL SUSP oral suspension Take 20 mLs (40 mg total) by mouth daily. 300 mL 12   No current facility-administered medications on file prior to visit.    Observations/Objective: Alert and oriented x 3. Not in acute distress. Physical examination not  completed as this is a telemedicine visit.  Assessment and Plan: 1. Anxiety and depression: - Newly diagnosed. - Patient denies thoughts of self-harm, suicidal ideations, homicidal ideations. - Patient declined pharmacological management.  - Referral to Psychiatry for further evaluation and management.  - Ambulatory referral to Psychiatry   Follow Up Instructions: Referral to Psychiatry. Follow-up with primary provider as  scheduled.   Patient was given clear instructions to go to Emergency Department or return to medical center if symptoms don't improve, worsen, or new problems develop.The patient verbalized understanding.  I discussed the assessment and treatment plan with the patient. The patient was provided an opportunity to ask questions and all were answered. The patient agreed with the plan and demonstrated an understanding of the instructions.   The patient was advised to call back or seek an in-person evaluation if the symptoms worsen or if the condition fails to improve as anticipated.    I provided 7 minutes total of non-face-to-face time during this encounter.   Camillia Herter, NP  [facility] [place], [place] 12/09/2021, 1:18 PM

## 2021-12-09 ENCOUNTER — Ambulatory Visit (INDEPENDENT_AMBULATORY_CARE_PROVIDER_SITE_OTHER): Payer: Medicaid Other | Admitting: Family

## 2021-12-09 ENCOUNTER — Other Ambulatory Visit: Payer: Self-pay

## 2021-12-09 DIAGNOSIS — F32A Depression, unspecified: Secondary | ICD-10-CM | POA: Diagnosis not present

## 2021-12-09 DIAGNOSIS — F419 Anxiety disorder, unspecified: Secondary | ICD-10-CM

## 2021-12-09 NOTE — Progress Notes (Signed)
Pt presents for telemedicine visit requesting letter that he needs service dog to help with depression no previous hx of depression

## 2021-12-20 ENCOUNTER — Other Ambulatory Visit: Payer: Self-pay

## 2021-12-20 ENCOUNTER — Ambulatory Visit
Admission: RE | Admit: 2021-12-20 | Discharge: 2021-12-20 | Disposition: A | Discharge status: Home / Self Care | Payer: Medicaid Other | Source: Ambulatory Visit | Attending: Radiation Oncology | Admitting: Radiation Oncology

## 2021-12-20 ENCOUNTER — Ambulatory Visit
Admission: RE | Admit: 2021-12-20 | Discharge: 2021-12-20 | Disposition: A | Discharge status: Home / Self Care | Payer: Self-pay | Source: Ambulatory Visit | Attending: Radiation Oncology | Admitting: Radiation Oncology

## 2021-12-20 VITALS — BP 127/97 | HR 101 | Temp 97.8°F | Resp 20 | Ht 69.5 in | Wt 154.0 lb

## 2021-12-20 DIAGNOSIS — C023 Malignant neoplasm of anterior two-thirds of tongue, part unspecified: Secondary | ICD-10-CM | POA: Insufficient documentation

## 2021-12-20 DIAGNOSIS — C029 Malignant neoplasm of tongue, unspecified: Secondary | ICD-10-CM

## 2021-12-20 DIAGNOSIS — Z79899 Other long term (current) drug therapy: Secondary | ICD-10-CM | POA: Insufficient documentation

## 2021-12-20 DIAGNOSIS — Z923 Personal history of irradiation: Secondary | ICD-10-CM | POA: Insufficient documentation

## 2021-12-20 DIAGNOSIS — R519 Headache, unspecified: Secondary | ICD-10-CM | POA: Diagnosis not present

## 2021-12-20 DIAGNOSIS — Z85810 Personal history of malignant neoplasm of tongue: Secondary | ICD-10-CM | POA: Diagnosis not present

## 2021-12-20 DIAGNOSIS — Z1329 Encounter for screening for other suspected endocrine disorder: Secondary | ICD-10-CM

## 2021-12-20 NOTE — Progress Notes (Signed)
Mr. [name] presents today for follow-up of radiation to his tongue completed on 01/19/2021  Pain issues, if any: Patient denies, but does report stiffness to the left side of his neck; States it does limit his range of motion/ability to turn his head side to side Using a feeding tube?: Not primarily any longer Weight changes, if any:  Wt Readings from Last 3 Encounters:  12/20/21 154 lb (69.9 kg)  12/01/21 155 lb (70.3 kg)  10/06/21 157 lb 6.4 oz (71.4 kg)   Swallowing issues, if any: Still only able to tolerate softer/pureed foods. Denies any difficulty swallowing these types of foods Smoking or chewing tobacco? None Using fluoride trays daily? N/A--denies any new dental concerns or mouth sores; last saw Dr. Sandi Mariscal on 06/22/2021 for restorative treatments to 3 teeth. States he missed a more recent appointment with her, and would like to get it rescheduled Last ENT visit was on:  CT Neck/Thyroid w/ Contrast  12/13/2021 --Impression Findings are compatible with metastatic disease with a large centrally necrotic lesion in the left neck measuring up to 4.3 cm which likely reflects a metastatic lymph node conglomerate with significant extranodal extension. This includes invasion of the sternocleidomastoid, encasement of the distal left common carotid artery, and occlusion of the left internal jugular vein.  There is a smaller centrally necrotic nodal versus soft tissue metastasis within the left sternocleidomastoid superiorly.  Multiple additional left cervical lymph nodes are slightly more prominent than on prior exam, though are nonspecific.  The larynx is poorly evaluated due to opposition of the vocal cords and indistinctness of the soft tissues. This limits evaluation for presence of a metachronous lesion in correlation with direct inspection is recommended.  --12/14/2021 (Dr. Meda Coffee May, Resident note)  I reviewed imaging with Dr. Conley Canal who unfortunately agrees that [name]  suspected recurrence is unresectable. We discussed Mr. Gal at tumor board Recommendations were as follows: Med onc and rad onc referrals Core needle bx for PDL-1 for possible tx in trials vs chemo immuno therapy PET  Other notable issues, if any: Denies any ear or jaw pain, or difficulty opening his mouth. Denies swelling or noticeable signs of lymphedema under his chin/down his neck. Reports trouble sleeping at night due to constant headaches (manages with OTC Tylenol).

## 2021-12-21 ENCOUNTER — Other Ambulatory Visit: Payer: Medicaid Other

## 2021-12-21 LAB — TSH: TSH: 1.364 u[IU]/mL (ref 0.320–4.118)

## 2021-12-22 ENCOUNTER — Ambulatory Visit
Admission: RE | Admit: 2021-12-22 | Discharge: 2021-12-22 | Disposition: A | Discharge status: Home / Self Care | Payer: Self-pay | Source: Ambulatory Visit | Attending: Radiation Oncology | Admitting: Radiation Oncology

## 2021-12-22 ENCOUNTER — Other Ambulatory Visit: Payer: Self-pay

## 2021-12-22 ENCOUNTER — Encounter: Payer: Self-pay | Admitting: Radiation Oncology

## 2021-12-22 DIAGNOSIS — C029 Malignant neoplasm of tongue, unspecified: Secondary | ICD-10-CM

## 2021-12-22 NOTE — Progress Notes (Signed)
Radiation Oncology         [phone] ________________________________  Name: [name] MRN: [id number]  Date: 02/02/2021  DOB: 1976/04/12  Follow-Up Visit Note  Outpatient  CC: Pcp, No  Fredricka Bonine, *  Diagnosis and Prior Radiotherapy:    ICD-10-CM   1. Cancer of anterior two-thirds of tongue ([place])  C02.3        Cancer Staging  Cancer of anterior two-thirds of tongue ([place]) Staging form: Oral Cavity, AJCC 8th Edition - Pathologic stage from 11/23/2020: Stage IVA (pT4a, pN0, cM0) - Signed by Eppie Gibson, MD on 11/23/2020 Stage prefix: Initial diagnosis  Radiation Treatment Dates: 12/09/2020 through 01/19/2021 Site Technique Total Dose (Gy) Dose per Fx (Gy) Completed Fx Beam Energies  Tongue: HN_tongue IMRT 60/60 2 30/30 6X    CHIEF COMPLAINT: Here for follow-up and surveillance of tongue cancer  Narrative:  [name] presents today for follow-up of radiation to his tongue completed on 01/19/2021  Pain issues, if any: Patient denies, but does report stiffness to the left side of his neck; States it does limit his range of motion/ability to turn his head side to side Using a feeding tube?: Not primarily any longer Weight changes, if any:  Wt Readings from Last 3 Encounters:  12/20/21 154 lb (69.9 kg)  12/01/21 155 lb (70.3 kg)  10/06/21 157 lb 6.4 oz (71.4 kg)   Swallowing issues, if any: Still only able to tolerate softer/pureed foods. Denies any difficulty swallowing these types of foods Smoking or chewing tobacco? None Using fluoride trays daily? N/A--denies any new dental concerns or mouth sores; last saw Dr. Sandi Mariscal on 06/22/2021 for restorative treatments to 3 teeth. States he missed a more recent appointment with her, and would like to get it rescheduled Last ENT visit was on:  CT Neck/Thyroid w/ Contrast  12/13/2021 --Impression Findings are compatible with metastatic disease with a large centrally necrotic lesion in the left neck measuring up  to 4.3 cm which likely reflects a metastatic lymph node conglomerate with significant extranodal extension. This includes invasion of the sternocleidomastoid, encasement of the distal left common carotid artery, and occlusion of the left internal jugular vein.  There is a smaller centrally necrotic nodal versus soft tissue metastasis within the left sternocleidomastoid superiorly.  Multiple additional left cervical lymph nodes are slightly more prominent than on prior exam, though are nonspecific.  The larynx is poorly evaluated due to opposition of the vocal cords and indistinctness of the soft tissues. This limits evaluation for presence of a metachronous lesion in correlation with direct inspection is recommended.  --12/14/2021 (Dr. Meda Coffee May, Resident note)  I reviewed imaging with Dr. Conley Canal who unfortunately agrees that [name] suspected recurrence is unresectable. We discussed [name] at tumor board Recommendations were as follows: Med onc and rad onc referrals Core needle bx for PDL-1 for possible tx in trials vs chemo immuno therapy PET  Other notable issues, if any: Denies any ear or jaw pain, or difficulty opening his mouth. Denies swelling or noticeable signs of lymphedema under his chin/down his neck. Reports trouble sleeping at night due to constant headaches (manages with OTC Tylenol).   ALLERGIES:  has no allergies on file.  Meds: Current Outpatient Medications  Medication Sig Dispense Refill   ferrous sulfate 325 (65 FE) MG EC tablet Take 1 tablet (325 mg total) by mouth 2 (two) times daily with a meal. 60 tablet 2   ibuprofen (ADVIL) 800 MG tablet Take 1 tablet (800  total) by mouth every 8 (eight) hours as needed. Take with food. 60 tablet 0  ° chlorthalidone (HYGROTON) 25 MG tablet Take 1 tablet (25 mg total) by mouth daily. (Patient not taking: No sig reported) 90 tablet 3  ° Cyanocobalamin (B-12) 1000 MCG SUBL Place 1,000 mcg under the tongue daily. (Patient not  taking: Reported on 02/02/2021) 30 tablet 2  ° ferrous sulfate 325 (65 FE) MG tablet TAKE 1 TABLET BY MOUTH 2 TIMES DAILY WITH A MEAL (Patient not taking: Reported on 02/02/2021) 60 tablet 2  ° lidocaine (XYLOCAINE) 2 % solution Patient: Mix 1part 2% viscous lidocaine, 1part H20. Swish & swallow 10mL of diluted mixture, 30min before meals and at bedtime, up to QID (Patient not taking: No sig reported) 200 mL 3  ° Nutritional Supplements (FEEDING SUPPLEMENT, OSMOLITE 1.5 CAL,) LIQD Increase Osmolite 1.5 to 8 cartons daily with 60 mL free water before and after each bolus feeding. Feed via low profile gastrostomy tube. Provides 100% estimated needs. 2840 kcal, 119 gm pro. 1896 mL 0  ° oxyCODONE (OXY IR/ROXICODONE) 5 MG immediate release tablet Take 5 mg by mouth 2 (two) times daily as needed. (Patient not taking: No sig reported)    ° °No current facility-administered medications for this encounter.  ° ° °Physical Findings: °The patient is in no acute distress. Patient is alert and oriented. °Today's Vitals  ° 12/20/21 1404  °BP: (!) 127/97  °Pulse: (!) 101  °Resp: 20  °Temp: 97.8 °F (36.6 °C)  °SpO2: 100%  °Weight: 154 lb (69.9 kg)  °Height: 5' 9.5" (1.765 m)  ° °Body mass index is 22.42 kg/m². ° °HEENT: No thrush.  EOMI. No visible tumor on reconstructed tongue or rest of oral cavity.  °Neck: anterior/submental/upper and mid neck edema; firmness in the left level 2/3 neck concerning for nodal recurrence. °Skin: skin intact over neck °Neuro: non focal °MSK: ambulatory, well nourished ° °ECOG = 1 ° °0 - Asymptomatic (Fully active, able to carry on all predisease activities without restriction) ° °1 - Symptomatic but completely ambulatory (Restricted in physically strenuous activity but ambulatory and able to carry out work of a light or sedentary nature. For example, light housework, office work) ° °2 - Symptomatic, <50% in bed during the day (Ambulatory and capable of all self care but unable to carry out any work  activities. Up and about more than 50% of waking hours) ° °3 - Symptomatic, >50% in bed, but not bedbound (Capable of only limited self-care, confined to bed or chair 50% or more of waking hours) ° °4 - Bedbound (Completely disabled. Cannot carry on any self-care. Totally confined to bed or chair) ° °5 - Death ° ° Oken MM, Creech RH, Tormey DC, et al. (1982). "Toxicity and response criteria of the Eastern Cooperative Oncology Group". Am. J. Clin. Oncol. 5 (6): 649-55 ° ° ° °Lab Findings: °Lab Results  °Component Value Date  ° WBC 4.8 01/12/2021  ° HGB 10.0 (L) 01/12/2021  ° HCT 32.2 (L) 01/12/2021  ° MCV 80.3 01/12/2021  ° PLT 286 01/12/2021  ° ° °Radiographic Findings: °We are working on getting the images as above from [facility] ° ° °Impression/Plan:  This is a wonderful man with history of pT4a, pN0, cM0 tongue cancer.  He received adjuvant radiation a year ago and now has evidence of left neck recurrence despite 54 lymph nodes being removed from the bilateral neck, and all were negative.  He completed RT to the oral cavity and b/l neck.    neck.  Much of the oral cavity received 60Gy in 30 fx and b/l neck received 54Gy in 30 fx. See snapshot from plan:      Thyroid function: Check annually - WNL today Lab Results  Component Value Date   TSH 1.364 12/20/2021    Nutrition :  He is using a feeding tube and eating Wt Readings from Last 3 Encounters:  12/20/21 154 lb (69.9 kg)  12/01/21 155 lb (70.3 kg)  10/06/21 157 lb 6.4 oz (71.4 kg)   Dysphagia: denies any current issues.  Applauded on not smoking or chewing tobacco.  Lost to f/u with dentistry. Will re-refer.  Patient will be reviewed at tumor board. Med onc and rad onc referrals have been made at [facility] for second opinions and I will refer back to Dr. Chryl Heck here to consider systemic options as well.  He is not a good surgical candidates per [facility]. .Core needle bx for PDL-1 for possible tx in trials vs chemo immuno therapy is pending at [facility]. PET pending  at [facility]. Patient desires treatment close to home.  PET and bx/PDL-1 results will help drive salvage options.  He may be a candidate for reirradiation with concurrent systemic therapy vs systemic therapy alone.  On date of service, in total, I spent 30 minutes on this encounter. Patient was seen in person.  _____________________________________   Eppie Gibson, MD

## 2021-12-23 ENCOUNTER — Other Ambulatory Visit: Payer: Self-pay | Admitting: Neurology

## 2021-12-28 ENCOUNTER — Telehealth: Payer: Self-pay | Admitting: Hematology and Oncology

## 2021-12-28 ENCOUNTER — Other Ambulatory Visit: Payer: Self-pay

## 2021-12-28 ENCOUNTER — Other Ambulatory Visit: Payer: Medicaid Other

## 2021-12-28 ENCOUNTER — Telehealth: Payer: Self-pay | Admitting: Family

## 2021-12-28 DIAGNOSIS — C029 Malignant neoplasm of tongue, unspecified: Secondary | ICD-10-CM

## 2021-12-28 NOTE — Telephone Encounter (Signed)
Scheduled appointment per provider request. Patient is aware. Transportation has been set up.   ?

## 2021-12-28 NOTE — Telephone Encounter (Signed)
Patient called re: medication but I was unclear on what the patient needed. Please call pt. ?

## 2021-12-29 ENCOUNTER — Other Ambulatory Visit: Payer: Self-pay

## 2021-12-29 ENCOUNTER — Encounter: Payer: Self-pay | Admitting: Hematology and Oncology

## 2021-12-29 ENCOUNTER — Inpatient Hospital Stay: Payer: Medicaid Other

## 2021-12-29 ENCOUNTER — Telehealth: Payer: Self-pay

## 2021-12-29 ENCOUNTER — Inpatient Hospital Stay: Payer: Medicaid Other | Attending: Hematology and Oncology | Admitting: Hematology and Oncology

## 2021-12-29 DIAGNOSIS — Z923 Personal history of irradiation: Secondary | ICD-10-CM | POA: Insufficient documentation

## 2021-12-29 DIAGNOSIS — D649 Anemia, unspecified: Secondary | ICD-10-CM | POA: Insufficient documentation

## 2021-12-29 DIAGNOSIS — Z801 Family history of malignant neoplasm of trachea, bronchus and lung: Secondary | ICD-10-CM | POA: Diagnosis not present

## 2021-12-29 DIAGNOSIS — R609 Edema, unspecified: Secondary | ICD-10-CM | POA: Insufficient documentation

## 2021-12-29 DIAGNOSIS — Z79899 Other long term (current) drug therapy: Secondary | ICD-10-CM | POA: Insufficient documentation

## 2021-12-29 DIAGNOSIS — C023 Malignant neoplasm of anterior two-thirds of tongue, part unspecified: Secondary | ICD-10-CM | POA: Insufficient documentation

## 2021-12-29 DIAGNOSIS — Z87891 Personal history of nicotine dependence: Secondary | ICD-10-CM | POA: Diagnosis not present

## 2021-12-29 DIAGNOSIS — Z823 Family history of stroke: Secondary | ICD-10-CM | POA: Diagnosis not present

## 2021-12-29 DIAGNOSIS — F1290 Cannabis use, unspecified, uncomplicated: Secondary | ICD-10-CM | POA: Diagnosis not present

## 2021-12-29 NOTE — Telephone Encounter (Signed)
Called pt needed Osmolite refilled, but pt stated he contacted [facility] for the request  ?

## 2021-12-29 NOTE — Progress Notes (Signed)
[place] NOTE  Patient Care Team: Camillia Herter, NP as PCP - General (Nurse Practitioner) Eppie Gibson, MD as Consulting Physician (Radiation Oncology) Malmfelt, Stephani Police, RN as Oncology Nurse Navigator Fredricka Bonine, MD as Referring Physician (Otolaryngology) Benay Pike, MD as Consulting Physician (Hematology and Oncology)  CHIEF COMPLAINTS/PURPOSE OF CONSULTATION:  Squamous cell carcinoma of oral cavity  ASSESSMENT & PLAN:  Cancer of anterior two-thirds of tongue ([place]) 1. [name] is a 46 y.o. 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 He is s/p total glossectomy via mandibular split approach, partial pharyngectomy, bilateral neck dissection, ALT free flap reconstruction on 10/15/20. Final pathology showed 5.4 cms tumor with DOI of 2.6 cms. No evidence of LVI or perineural invasion, margins free of tumor, closest approach is 0.3 cms from posterior soft tissue margin. 54 LN negative.  He completed adjuvant radiation. His most recent PET imaging is suggestive of recurrence with large left cervical node with ENE extending to LCA, IJV and he has been seeing team from [facility]. He has followed up with Medical Oncology and radiation oncology in [facility] and we are awaiting recommendations Patient mentions that they are considering concurrent chemoradiation but he has a follow up on Wednesday to follow up. I think concurrent chemotherapy is reasonable since he hasnt received it in the adjuvant setting, but he will have to discuss with rad onc about risks of reirradiation. We have discussed today about chemotherapy in the concurrent setting which would be weekly cisplatin, discussed mechanism of action, adverse effects of cisplatin which include but not limited to mechanism of action, adverse effects of chemotherapy including but not limited to  fatigue, cytopenias, neuropathy, ototoxicity and nephrotoxicity. 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  Normocytic normochromic anemia This has completely resolved since his last visit CBC today is unremarkable.  No orders of the defined types were placed in this encounter.   HISTORY OF PRESENTING ILLNESS:   [name] 46 y.o. male is here because of new diagnosis of left tongue cancer.   He arrived with his family member today to the appointment.  He tells me that he first noticed some pain in the left tongue but attributed this to back to scraping his tongue.  He went in to see his dentist and was found to have this left tongue mass which was biopsied back in November and demonstrated invasive squamous cell carcinoma.  He then underwent MRI imaging and surgery at [facility], had total glossectomy, partial pharyngectomy, bilateral neck dissection and is following up with medical oncology and radiation oncology for adjuvant recommendations.  Biopsies revealed:  09/05/2020 FINAL MICROSCOPIC DIAGNOSIS:  A. TONGUE, LEFT BASE, BIOPSY:  - Invasive squamous cell carcinoma.  COMMENT:  There is no perineural or lymphovascular invasion. The carcinoma appears well differentiated ADDENDUM:  P16 is only focally and weakly positive in the invasive tumor (<5%).  10/12/2020 IMPRESSION: Large mass within the tongue apparently restricted to the left side, without evidence of crossing of the tongue midline. Lesion in total measures about 7 cm from front to back. Lesion extends all the way from the superior surface of the anterior tongue back to the posterior tongue and base of the left. Tumor bulges into the sublingual region on the left. This  appears very similar to the previous CT, as best as the 2 techniques can be compared. Invasion of the left submandibular gland is not demonstrated. There is  edematous change of the adjacent soft tissues, questionably secondary to regional radiation. Mild edema and enhancement of level 2 nodes on the left likely related to radiation. No evidence of  cervical lymph node enlargement or necrosis.  12/17. Had total glossectomy via mandibular split approach, partial pharyngectomy, bilateral neck dissection, ALT free flap reconstruction on 10/15/20. Final pathology showed 5.4 cms tumor with DOI of 2.6 cms. No evidence of LVI or perineural invasion, margins free of tumor, closest approach is 0.3 cms from posterior soft tissue margin. 54 LN negative. He completed radiation 01/19/2021   PET 12/23/2021  1.  Findings compatible with metastatic disease as evidenced by a hypermetabolic large lesion in the left neck favored reflect a metastatic lymph node conglomerate as well as a smaller hypermetabolic left lymph node versus soft tissue deposit within the left sternocleidomastoid superiorly.  2.  Asymmetric hypermetabolic uptake of the right posterior thyroid cartilage is less clear clinical significance and may be posttreatment related. Recommend attention on follow-up imaging.  3.  No hypermetabolic avid disease consistent with distal metastatic disease.  4.  Additional CT findings as above.  Interval History  He is here for FU given findings of recurrent cancer. Since last visit, he has noticed increased nodularity of the left neck, had an imaging which suggested that the cancer is back. He met with Dr Belva Chimes and radiation oncology team at [facility], he is considered unresectable at this time. Patient tells me that he was likely going to be recommended concurrent chemoradiation but he has another appointment on Wednesday to confirm this. He doesn't have any difficulty from this tumor. No difficulty with breathing, no pain or sore throat. He uses his G tube mostly for feeding, can eat some food.  No hematochezia or melena.  Rest of the pertinent 10 point ROS reviewed  and negative.  MEDICAL HISTORY:  Past Medical History:  Diagnosis Date   Cancer [facility])    tongue    SURGICAL HISTORY: Past Surgical History:  Procedure Laterality Date   BIOPSY  06/27/2021   Procedure: BIOPSY;  Surgeon: Rush Landmark Telford Nab., MD;  Location: [facility];  Service: Gastroenterology;;   COLONOSCOPY WITH PROPOFOL N/A 06/27/2021   Procedure: COLONOSCOPY WITH PROPOFOL;  Surgeon: Irving Copas., MD;  Location: [facility];  Service: Gastroenterology;  Laterality: N/A;   DIRECT LARYNGOSCOPY N/A 09/05/2020   Procedure: DIRECT LARYNGOSCOPY;  Surgeon: Marcina Millard, MD;  Location: [place];  Service: ENT;  Laterality: N/A;   ESOPHAGOGASTRODUODENOSCOPY (EGD) WITH PROPOFOL N/A 06/27/2021   Procedure: ESOPHAGOGASTRODUODENOSCOPY (EGD) WITH PROPOFOL;  Surgeon: Irving Copas., MD;  Location: [facility];  Service: Gastroenterology;  Laterality: N/A;   ESOPHAGOSCOPY N/A 09/05/2020   Procedure: ESOPHAGOSCOPY;  Surgeon: Marcina Millard, MD;  [facility];  Service: ENT;  Laterality: N/A;   FRACTURE SURGERY     IR REPLACE G-TUBE SIMPLE WO FLUORO  05/29/2021   TONGUE BIOPSY N/A 09/05/2020   Procedure: TONGUE BIOPSY;  Surgeon: Marcina Millard, MD;  Location: [place];  Service: ENT;  Laterality: N/A;    SOCIAL HISTORY: Social History   Socioeconomic History   Marital status: Single    Spouse name: Not on file   Number of children: Not on file   Years of education: Not on file   Highest education level: Not on file  Occupational History  Not on file  Tobacco Use   Smoking status: Former Smoker    Packs/day: 1PPD for 25 yrs    Types: Cigarettes   Smokeless tobacco: Never Used  Scientific laboratory technician Use: Never used  Substance and Sexual Activity   Alcohol use: Not Currently   Drug use: Yes    Types: Marijuana   Sexual activity: Not Currently  Other Topics Concern   Not on file  Social History Narrative   Not on file   Social Determinants of  Health   Financial Resource Strain: Not on file  Food Insecurity: Not on file  Transportation Needs: Not on file  Physical Activity: Not on file  Stress: Not on file  Social Connections: Not on file  Intimate Partner Violence: Not on file    FAMILY HISTORY: Family History  Problem Relation Age of Onset   Lung cancer Mother    Stroke Father    Lung cancer Sister    Lung cancer Maternal Grandmother     ALLERGIES:  has No Known Allergies.  MEDICATIONS:  Current Outpatient Medications  Medication Sig Dispense Refill   amitriptyline (ELAVIL) 25 MG tablet TAKE 1/2 TABLET AT BEDTIME FOR ONE WEEK, THEN INCREASE TO 1 TABLET AT BEDTIME 90 tablet 0   amoxicillin-clavulanate (AUGMENTIN) 875-125 MG tablet Take 1 tablet by mouth 2 (two) times daily.     butalbital-acetaminophen-caffeine (FIORICET) 50-325-40 MG tablet Take 1 tablet by mouth every 8 (eight) hours as needed for headache. 10 tablet 0   ferrous sulfate 325 (65 FE) MG EC tablet Take 1 tablet (325 mg total) by mouth 2 (two) times daily with a meal. 60 tablet 2   Nutritional Supplements (FEEDING SUPPLEMENT, OSMOLITE 1.5 CAL,) LIQD Increase Osmolite 1.5 to 8 cartons daily with 60 mL free water before and after each bolus feeding. Feed via low profile gastrostomy tube. Provides 100% estimated needs. 2840 kcal, 119 gm pro. 1896 mL 0   omeprazole (FIRST-OMEPRAZOLE) 2 mg/mL SUSP oral suspension Take 20 mLs (40 mg total) by mouth daily. 300 mL 12   No current facility-administered medications for this visit.     PHYSICAL EXAMINATION: ECOG PERFORMANCE STATUS: 0 - Asymptomatic  Vitals:   12/29/21 1110  BP: 134/88  Pulse: 96  Resp: 16  Temp: (!) 97.5 F (36.4 C)  SpO2: 100%   Filed Weights   12/29/21 1110  Weight: 154 lb 9.6 oz (70.1 kg)   Physical Exam Constitutional:      Appearance: Normal appearance. He is normal weight.  HENT:     Head: Normocephalic and atraumatic.  Eyes:     Extraocular Movements: Extraocular  movements intact.  Cardiovascular:     Rate and Rhythm: Normal rate and regular rhythm.     Pulses: Normal pulses.  Pulmonary:     Effort: Pulmonary effort is normal.     Breath sounds: Normal breath sounds.  Abdominal:     General: Abdomen is flat. Bowel sounds are normal.  Musculoskeletal:        General: No swelling. Normal range of motion.     Cervical back: Normal range of motion and neck supple. No rigidity.  Lymphadenopathy:     Cervical: Cervical adenopathy (Left sided neck mass protruding through the skin with central ulceration, exact size hard to define on exam, skin tightening noted in the neck from prior surgery and radiation changes) present.  Skin:    General: Skin is warm and dry.  Neurological:     General: No  focal deficit present.     Mental Status: He is alert.  Psychiatric:        Mood and Affect: Mood normal.        Behavior: Behavior normal.    LABORATORY DATA:  I have reviewed the data as listed Lab Results  Component Value Date   WBC 5.0 10/25/2021   HGB 14.1 10/25/2021   HCT 41.9 10/25/2021   MCV 89.9 10/25/2021   PLT 246 10/25/2021     Chemistry      Component Value Date/Time   NA 135 10/25/2021 1018   K 4.0 10/25/2021 1018   CL 99 10/25/2021 1018   CO2 27 10/25/2021 1018   BUN 12 10/25/2021 1018   CREATININE 0.78 10/25/2021 1018   CREATININE 0.74 06/01/2021 1057      Component Value Date/Time   CALCIUM 10.1 10/25/2021 1018   ALKPHOS 74 10/25/2021 1018   AST 27 10/25/2021 1018   ALT 34 10/25/2021 1018   BILITOT 0.5 10/25/2021 1018     I have reviewed pertinent records from [facility] I have tried to coordinate with our rad onc team and our NN as well.  RADIOGRAPHIC STUDIES: I have personally reviewed the radiological images as listed and agreed with the findings in the report.  No results found.  All questions were answered. The patient knows to call the clinic with any problems, questions or concerns. I spent 40 min in the care of this  patient including H and P, review of records, counseling and coordination of care.     Benay Pike, MD 12/29/2021 5:07 PM

## 2021-12-29 NOTE — Assessment & Plan Note (Signed)
This has completely resolved since his last visit ?CBC today is unremarkable. ?

## 2021-12-29 NOTE — Assessment & Plan Note (Signed)
1. [name] is a 46 y.o. 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 He is s/p total glossectomy via mandibular split approach, partial pharyngectomy, bilateral neck dissection, ALT free flap reconstruction on 10/15/20. ?Final pathology showed 5.4 cms tumor with DOI of 2.6 cms. No evidence of LVI or perineural invasion, margins free of tumor, closest approach is 0.3 cms from posterior soft tissue margin. ?54 LN negative.  ?He completed adjuvant radiation. ?His most recent PET imaging is suggestive of recurrence with large left cervical node with ENE extending to LCA, IJV and he has been seeing team from [facility]. ?He has followed up with Medical Oncology and radiation oncology in [place] and we are awaiting recommendations ?Patient mentions that they are considering concurrent chemoradiation but he has a follow up on Wednesday to follow up. ?I think concurrent chemotherapy is reasonable since he hasnt received it in the adjuvant setting, but he will have to discuss with rad onc about risks of reirradiation. ?We have discussed today about chemotherapy in the concurrent setting which would be weekly cisplatin, discussed mechanism of action, adverse effects of cisplatin which include but not limited to mechanism of action, adverse effects of chemotherapy including but not limited to fatigue, cytopenias, neuropathy, ototoxicity and nephrotoxicity. 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 ?

## 2021-12-29 NOTE — Progress Notes (Signed)
Pt called regarding Osmolite. Katelin, RN is working on this for pt and has been in touch with him to resolve this.  ?

## 2021-12-29 NOTE — Telephone Encounter (Signed)
Patient called concerned that he has been waiting to receive  his Osmolite order for about 3 days. Informed him I would reach out to [employer] and call him back with an update.  ? ?Spoke with Thedore Mins at [facility]. He confirmed that Osmolite is still on backorder, and stated that [employer] is working on getting a substitute. He stated he would look into Mr. Langhorst status, and call the patient or our dieticians back with an update.  ? ?Returned patient's call and relayed above information. Patient verbalized understanding and appreciation of call. No other needs identified, but patient confirmed he will knows how to reach me directly should he have any additional questions/concerns ?

## 2022-01-03 ENCOUNTER — Telehealth: Payer: Self-pay

## 2022-01-03 NOTE — Telephone Encounter (Signed)
Spoke with Reggie at [facility] and confirmed that signed certificate of medical necessity for supplement had been received, and patient's orders updated. Copy of signed form will also be scanned into patient EMR later today per clerical team ?

## 2022-01-27 ENCOUNTER — Ambulatory Visit (HOSPITAL_BASED_OUTPATIENT_CLINIC_OR_DEPARTMENT_OTHER): Payer: Medicaid Other

## 2022-01-27 ENCOUNTER — Telehealth: Payer: Self-pay | Admitting: Gastroenterology

## 2022-01-27 NOTE — Telephone Encounter (Signed)
Spoke with Minerva NIA clinical review nurse and was informed the pt will need to have an US done before getting an MRI done first. Please advise if a peer to peer needs to be scheduled. ?

## 2022-01-27 NOTE — Telephone Encounter (Signed)
Hey Beth, yes I faxed everything over. The number I called is [phone] (press 8) enter tracking number [id number] press 2, press 3 and then press 1 this should allow you to speak to someone. (Ask to speak to a clinical review nurse) ?

## 2022-01-27 NOTE — Telephone Encounter (Signed)
Beth,  ?I'm not sure that they have reviewed Paula's note extensively enough. ?With the pancreatic lesion noted on outside imaging, MRI/MRCP would be next steps. ?If you talk with them and they say perform U/S first and if negative then MRI, then that is the way it will have to be. ?I don't think a PTP is needed if they have read her last note and my attestation to the note. ?Thanks. ?GM ?

## 2022-01-30 ENCOUNTER — Telehealth: Payer: Self-pay

## 2022-01-30 ENCOUNTER — Other Ambulatory Visit: Payer: Self-pay

## 2022-01-30 DIAGNOSIS — K869 Disease of pancreas, unspecified: Secondary | ICD-10-CM

## 2022-01-30 NOTE — Telephone Encounter (Signed)
Patient's aunt, Hebert Soho, returned your phone call.  Please call her back at [phone].  Thank you. ?

## 2022-01-30 NOTE — Telephone Encounter (Signed)
Called patient. No answer. Still rings then goes busy. ?Patient has viewed the appointment on his My Chart. ?Message routed to the patient using My Chart. ?

## 2022-01-30 NOTE — Telephone Encounter (Signed)
That is fine, if that is how the insurance wants to handle things. ?However, we are likely going to need, when the abdominal ultrasound cannot visualize the entire pancreas, to then perform a CT, and then that will likely lead to a MRICP.  Such is life with these insurances.  I think CT scan will likely be next if abdominal U/S does not report this finding, because we know that abdominal ultrasounds are not as sensitive.   ?Thanks. ?GM ?

## 2022-01-30 NOTE — Telephone Encounter (Signed)
Called the patient to discuss going for an abdominal u/s tomorrow 01/31/22 at 9:00 am [facility] location. ?Phone rings, then goes to a busy signal. No voicemail. ?Called his aunt B. Uno. She answers. States she cannot speak for him on if he can go to an appointment tomorrow morning and asks I call the patient. ?Called the patient again x 2. Phone rings, then goes to a busy signal. No voicemail. ? ? ?Presently scheduled for an abdominal ultrasound at the [facility] at [facility] location. He would be NPO after midnight and arrive at 8:45 am. ?

## 2022-01-30 NOTE — Telephone Encounter (Signed)
Called the patient to discuss. Line continues to ring and go to a busy signal. ?

## 2022-01-30 NOTE — Telephone Encounter (Signed)
Called patient. Phone rings and goes to busy signal. No voicemail. ?

## 2022-01-30 NOTE — Telephone Encounter (Signed)
Patient is communicating with me through My Chart. Closing this encounter. ?

## 2022-01-30 NOTE — Telephone Encounter (Signed)
Called the insurance. The case is in review with the physician. The CT from 2020 was not enough to change anything. I will get an abd u/s ordered. ?Is this okay? ?

## 2022-01-31 ENCOUNTER — Ambulatory Visit (HOSPITAL_BASED_OUTPATIENT_CLINIC_OR_DEPARTMENT_OTHER): Payer: Medicaid Other

## 2022-02-02 ENCOUNTER — Ambulatory Visit (HOSPITAL_BASED_OUTPATIENT_CLINIC_OR_DEPARTMENT_OTHER)
Admission: RE | Admit: 2022-02-02 | Discharge: 2022-02-02 | Disposition: A | Discharge status: Home / Self Care | Payer: Medicaid Other | Source: Ambulatory Visit | Attending: Gastroenterology | Admitting: Gastroenterology

## 2022-02-02 DIAGNOSIS — K869 Disease of pancreas, unspecified: Secondary | ICD-10-CM | POA: Diagnosis present

## 2022-02-02 IMAGING — US US ABDOMEN COMPLETE
1 series · 13 of 25 positions shown · non-contrast
Comparison: CT scan of the abdomen and pelvis [DATE]

CLINICAL DATA: Pancreatic mass identified on the CT scan from
[DATE]

EXAM:
ABDOMEN ULTRASOUND COMPLETE

[Series 1: us abdomen complete · 13 of 79 slices shown]
[im 1/79]
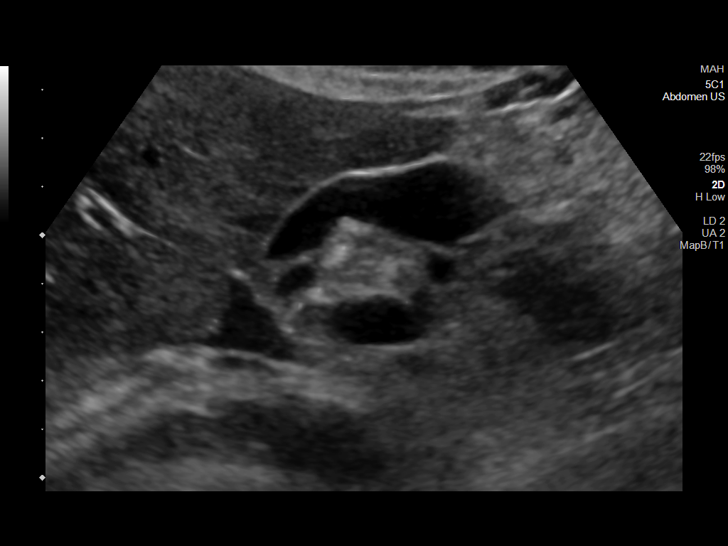
[im 7/79]
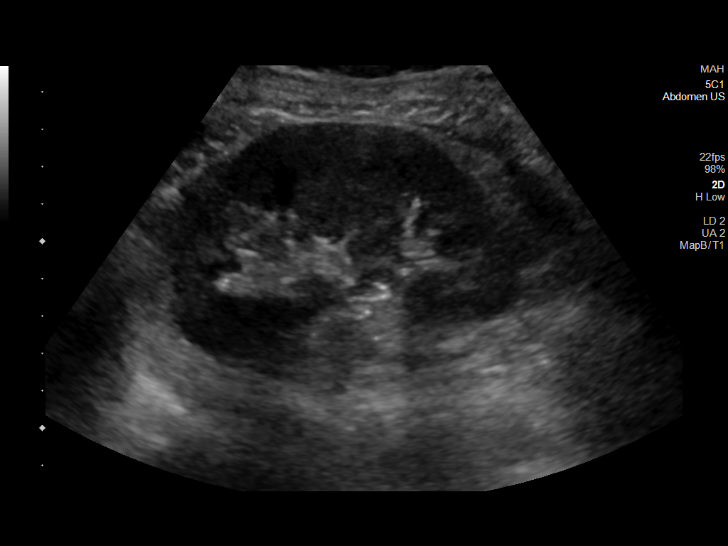
[im 14/79]
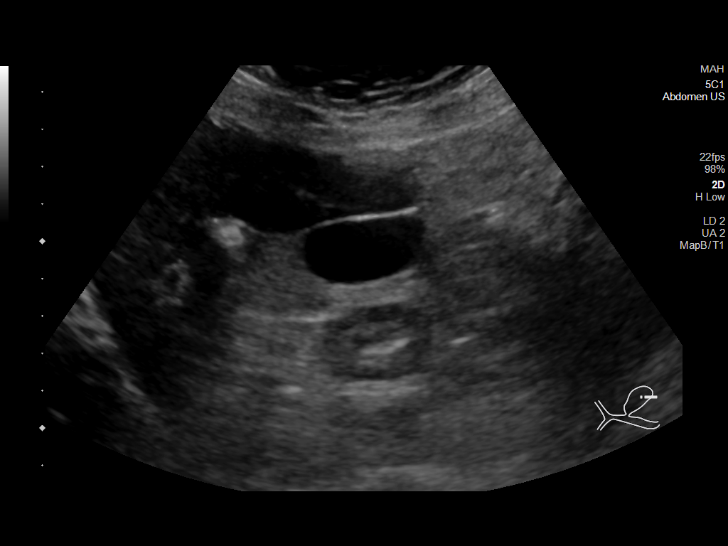
[im 20/79]
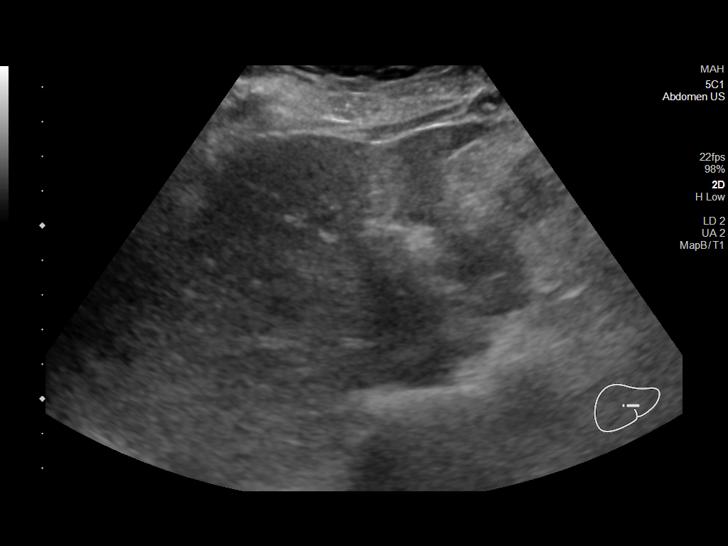
[im 27/79]
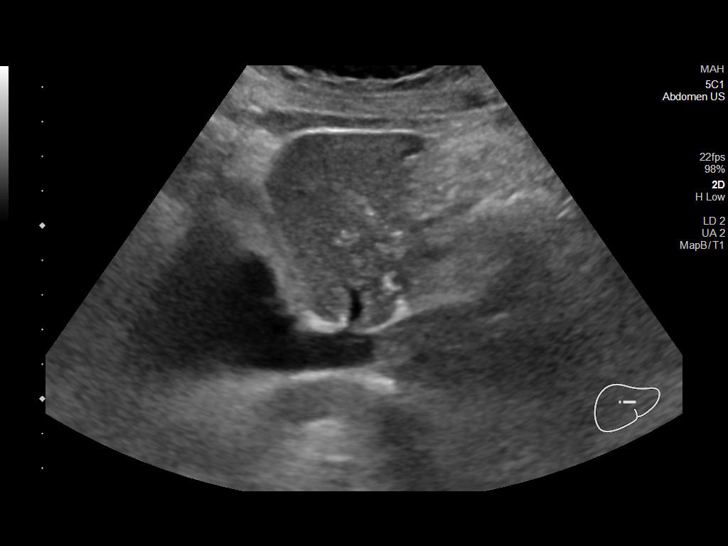
[im 33/79]
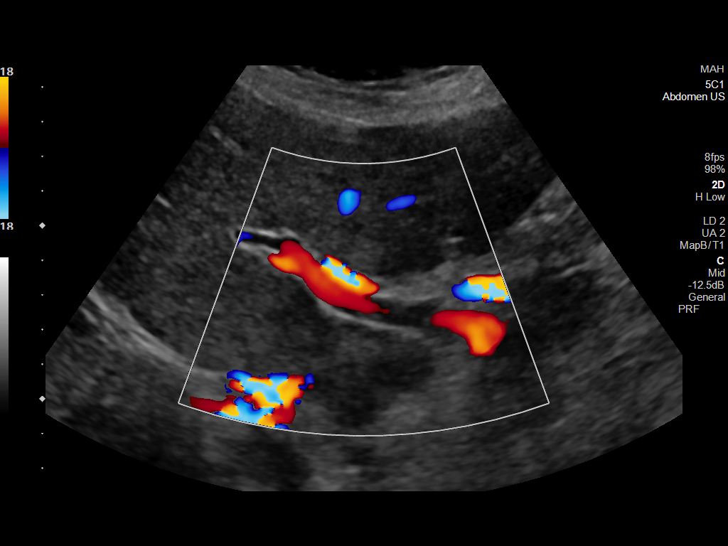
[im 40/79]
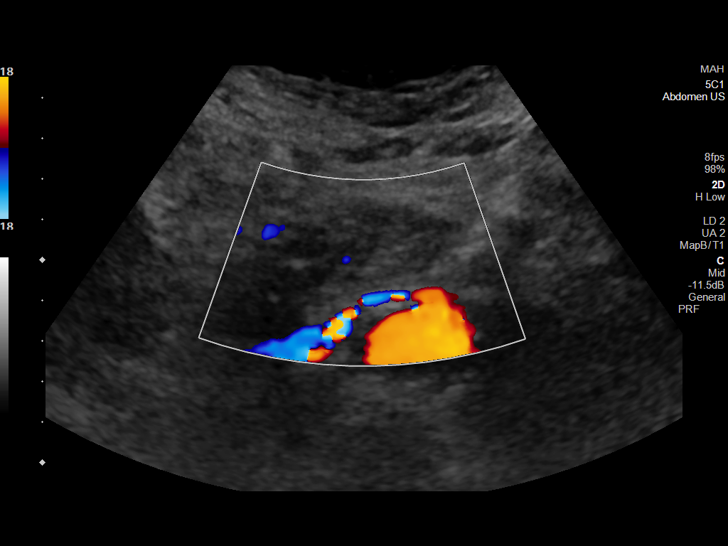
[im 46/79]
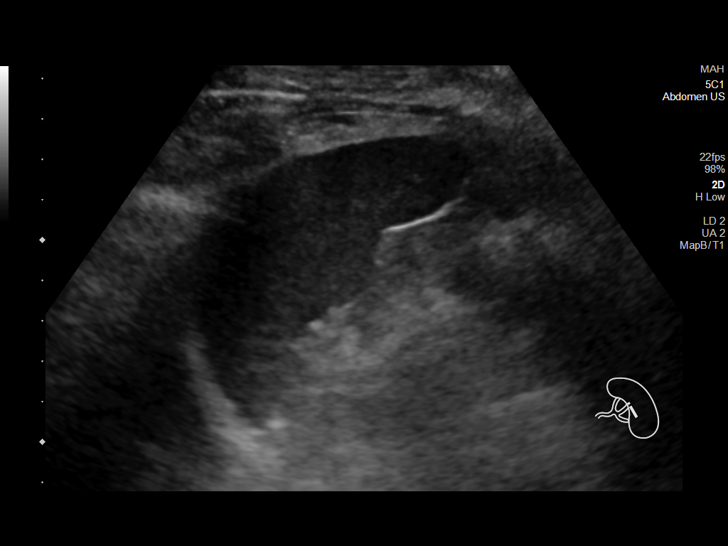
[im 53/79]
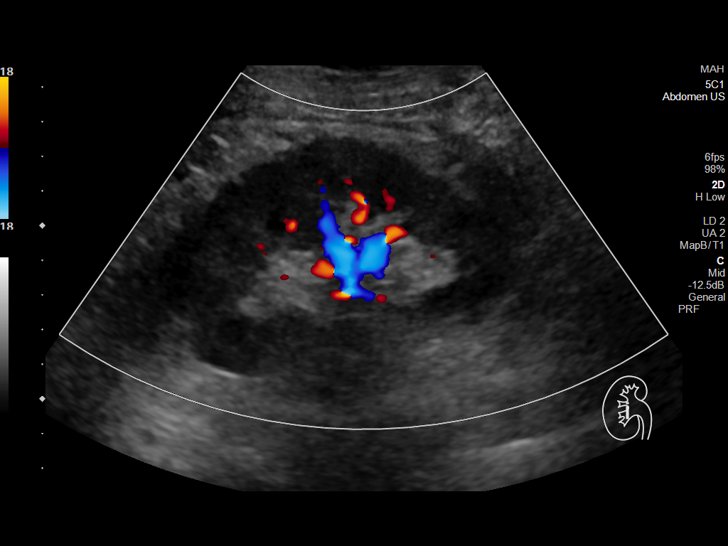
[im 59/79]
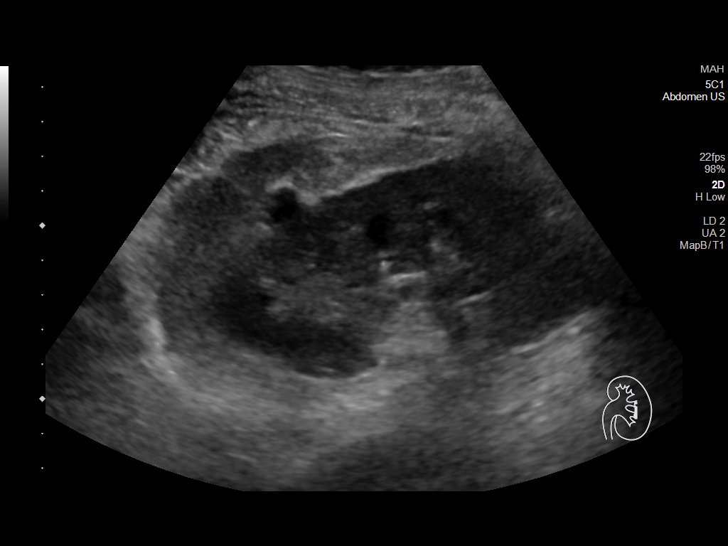
[im 66/79]
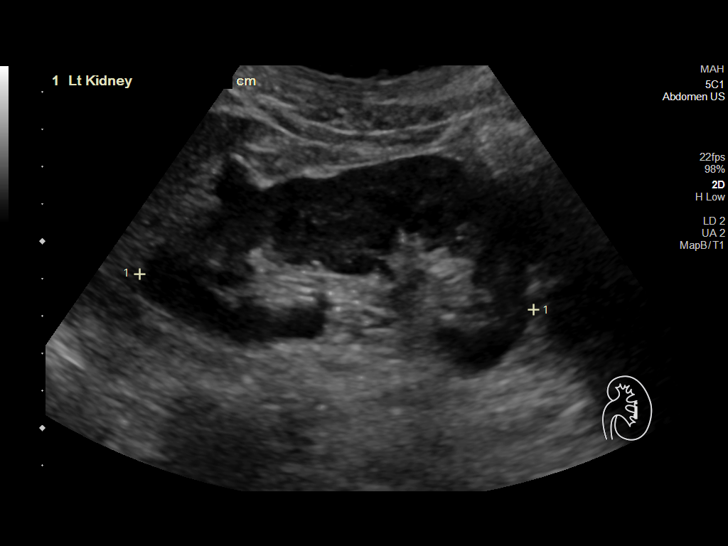
[im 72/79]
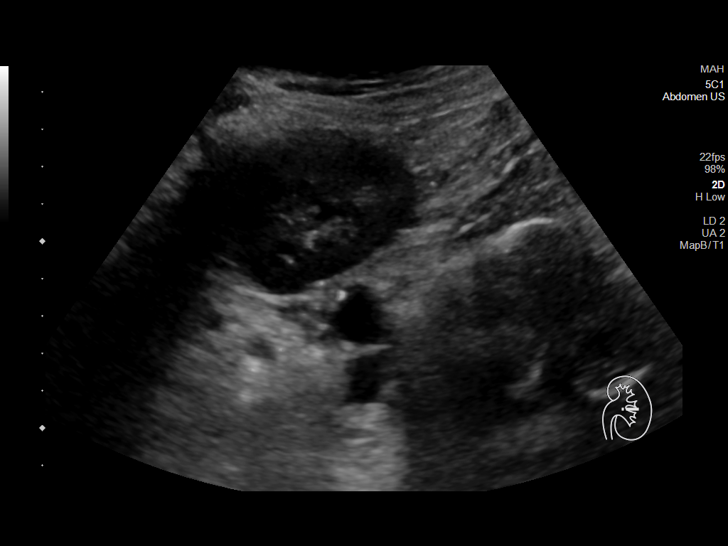
[im 79/79]
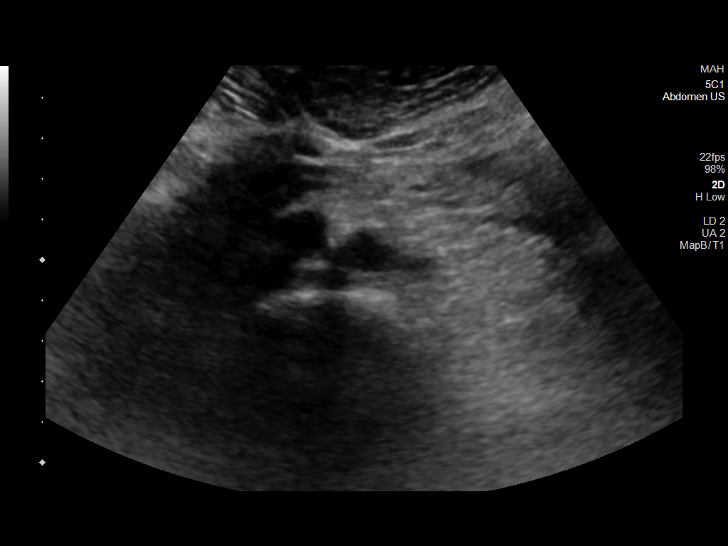

[13 of 25 positions shown; findings below may reference images not displayed]

FINDINGS: Gallbladder: No gallstones or wall thickening visualized. No
sonographic Murphy sign noted by sonographer.

Common bile duct: Diameter: 2.6 mm

Liver: Diffuse increased echogenicity throughout the liver. No focal
mass. Portal vein is patent on color Doppler imaging with normal
direction of blood flow towards the liver.

IVC: No abnormality visualized.

Pancreas: Possible mass in the pancreas correlating with previous
imaging measuring 6.3 x 1.9 x 4.2 cm today. This mass measured
approximately 3 cm previously.

Spleen: Size and appearance within normal limits.

Right Kidney: Length: 9.5 cm. Echogenicity within normal limits. No
mass or hydronephrosis visualized.

Left Kidney: Length: 10.6 cm. There is a hypoechoic mass off the
upper pole, possibly but not definitively a complicated cysts
measuring up to 1.3 cm.

Abdominal aorta: No aneurysm visualized.

Other findings: None.
IMPRESSION: 1. There is an apparent 6.3 x 1.9 x 4.2 cm mass in or adjacent to
the pancreas. This mass likely correlates with previous CT findings.
Recommend an MRI with and without contrast for more complete
characterization.
2. There is a 1.3 cm hypoechoic mass off the left kidney. This could
represent a complicated cyst or a solid mass. Recommend attention to
this region on the recommended MRI.
3. Probable hepatic steatosis.
4. No other abnormalities.

These results will be called to the ordering clinician or
representative by the Radiologist Assistant, and communication
documented in the PACS or [REDACTED].

## 2022-02-06 ENCOUNTER — Other Ambulatory Visit: Payer: Self-pay

## 2022-02-06 DIAGNOSIS — K869 Disease of pancreas, unspecified: Secondary | ICD-10-CM

## 2022-02-07 ENCOUNTER — Other Ambulatory Visit: Payer: Self-pay | Admitting: Gastroenterology

## 2022-02-07 DIAGNOSIS — K869 Disease of pancreas, unspecified: Secondary | ICD-10-CM

## 2022-02-08 ENCOUNTER — Encounter (HOSPITAL_COMMUNITY): Payer: Self-pay

## 2022-02-08 ENCOUNTER — Ambulatory Visit (HOSPITAL_COMMUNITY): Payer: Medicaid Other | Admitting: Licensed Clinical Social Worker

## 2022-02-08 ENCOUNTER — Ambulatory Visit (HOSPITAL_COMMUNITY)
Admission: RE | Admit: 2022-02-08 | Discharge: 2022-02-08 | Disposition: A | Discharge status: Home / Self Care | Payer: Medicaid Other | Source: Ambulatory Visit | Attending: Gastroenterology | Admitting: Gastroenterology

## 2022-02-08 DIAGNOSIS — K869 Disease of pancreas, unspecified: Secondary | ICD-10-CM | POA: Diagnosis present

## 2022-02-08 IMAGING — MR MR ABDOMEN WO/W CM MRCP
13 of 17 series · 35 of 48 positions shown · IV contrast (gadavist)
Comparison: Abdominal ultrasound [DATE]

CLINICAL DATA: Pancreatic lesion

EXAM:
MRI ABDOMEN WITHOUT AND WITH CONTRAST (INCLUDING MRCP)
TECHNIQUE: Multiplanar multisequence MR imaging of the abdomen was performed
both before and after the administration of intravenous contrast.
Heavily T2-weighted images of the biliary and pancreatic ducts were
obtained, and three-dimensional MRCP images were rendered by post
processing.
CONTRAST:  7mL GADAVIST GADOBUTROL 1 MMOL/ML IV SOLN

[Series 4: T2 fat-sat · axial · 6.0mm · 1.25mm/px · 1 of 36 slices shown]
[im 1/36]
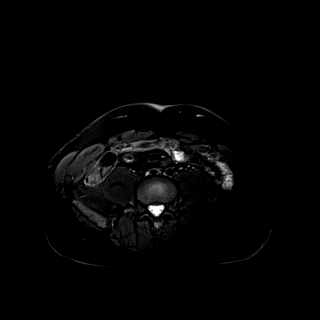

[Series 6: T2 · coronal · 6.0mm · 1.60mm/px · 1 of 30 slices shown (1 of 2)]
[im 1/30]
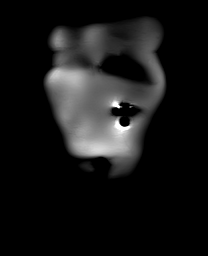

[Series 7: DWI · axial · 6.0mm · 1.49mm/px · z∈[-111,+141]mm · 3 of 72 slices shown (1 of 2)]
[im 1/72]
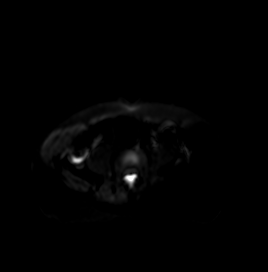
[im 36/72]
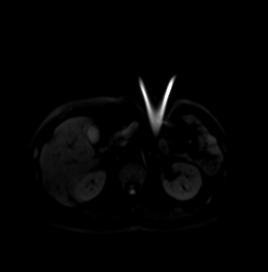
[im 72/72]
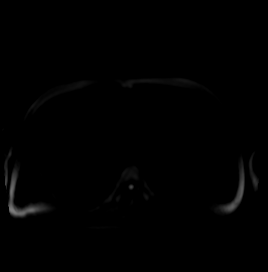

[Series 8: DWI · axial · 6.0mm · 1.49mm/px · 1 of 36 slices shown (2 of 2)]
[im 1/36]
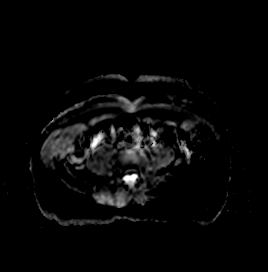

[Series 10: T1 · axial · 3.0mm · 1.25mm/px · z∈[-114,+99]mm · 4 of 72 slices shown (1 of 2)]
[im 1/72]
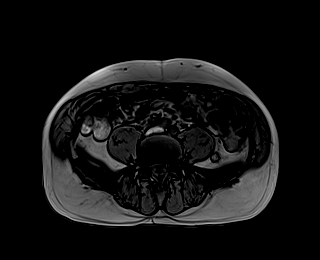
[im 24/72]
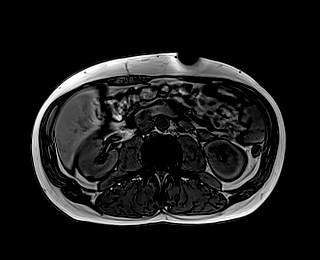
[im 48/72]
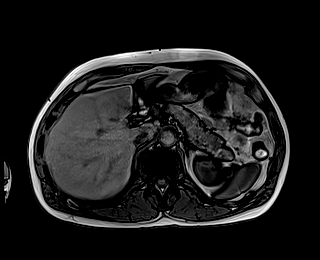
[im 72/72]
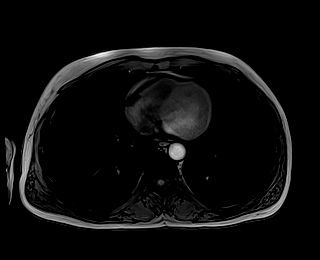

[Series 11: T1 · axial · 3.0mm · 1.25mm/px · z∈[-114,+99]mm · 4 of 72 slices shown (2 of 2)]
[im 1/72]
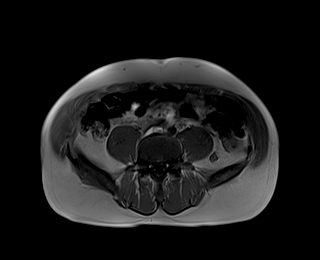
[im 24/72]
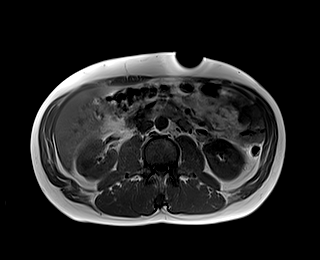
[im 48/72]
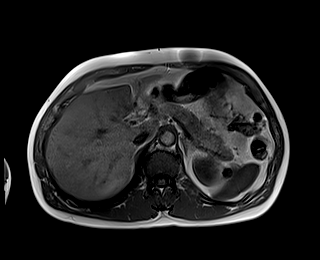
[im 72/72]
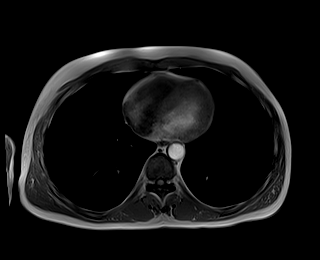

[Series 12: cor obl thk · sagittal · 50.0mm · 0.78mm/px · 1 of 9 slices shown]
[im 1/9]
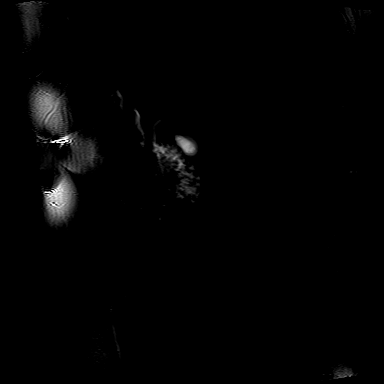

[Series 14: cor_3d_spc_trig · coronal · 1.0mm · 0.49mm/px · 4 of 72 slices shown]
[im 1/72]
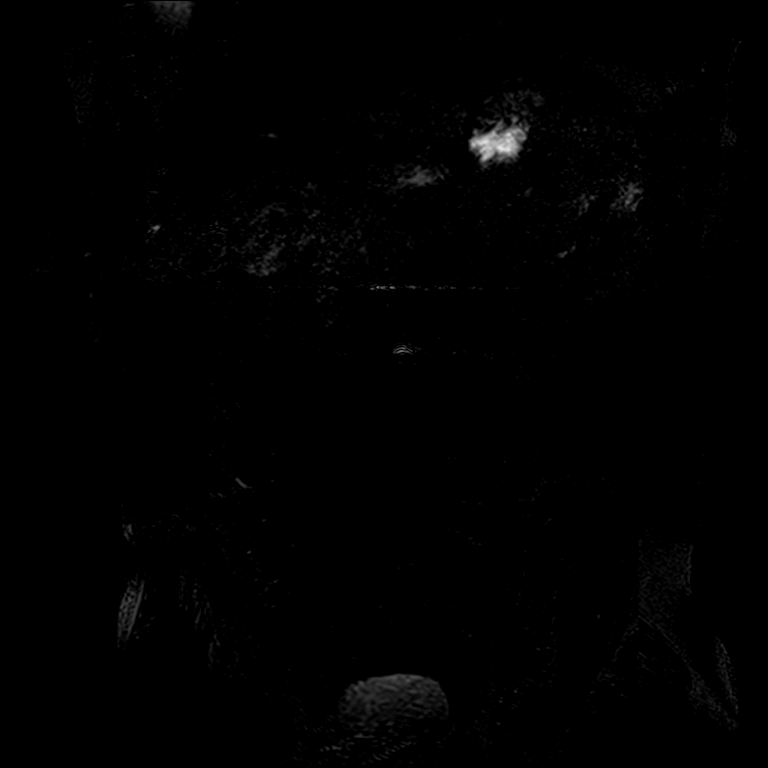
[im 24/72]
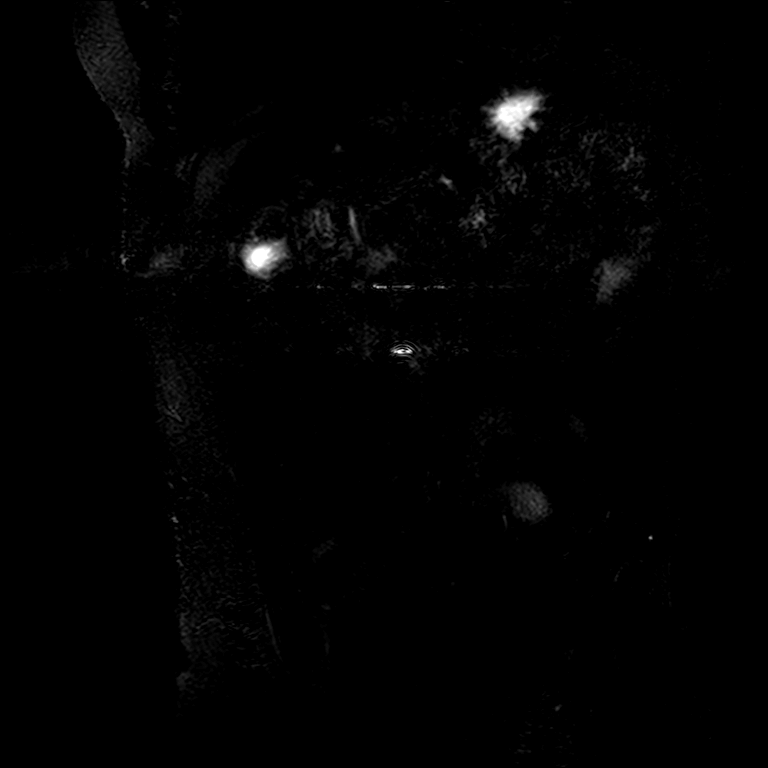
[im 48/72]
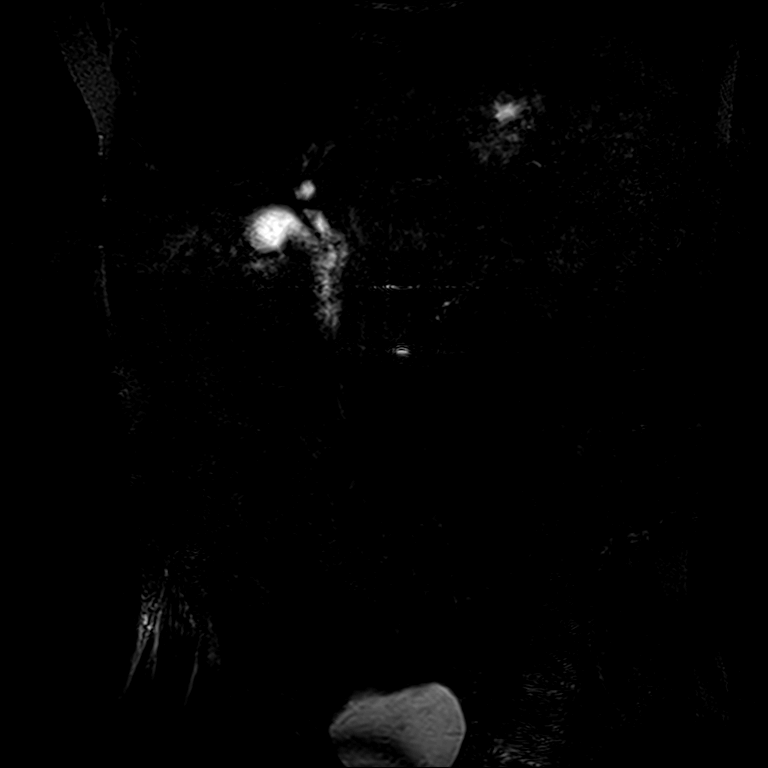
[im 72/72]
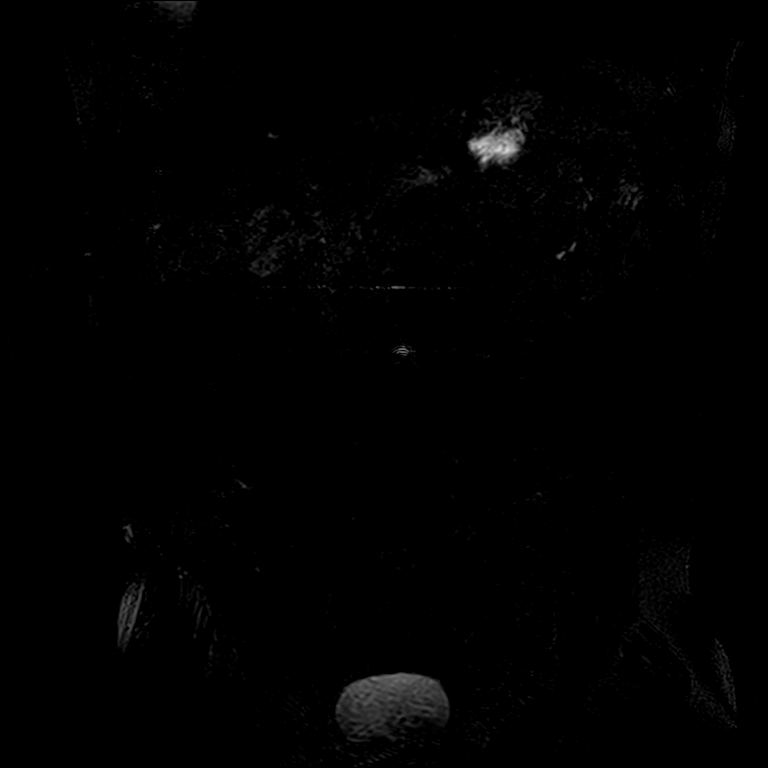

[Series 16: T2 · axial · 6.0mm · 1.56mm/px · z∈[-136,+94]mm · 2 of 33 slices shown (2 of 2)]
[im 1/33]
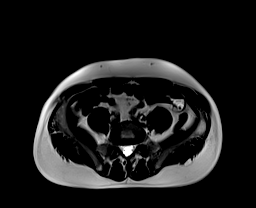
[im 33/33]
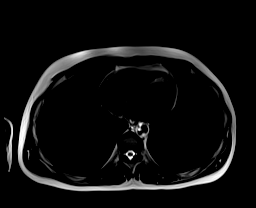

[Series 18: T1 dynamic · axial · 3.0mm · 1.25mm/px · z∈[-138,+99]mm · 4 of 80 slices shown (1 of 4)]
[im 1/80]
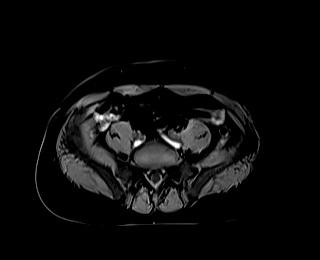
[im 27/80]
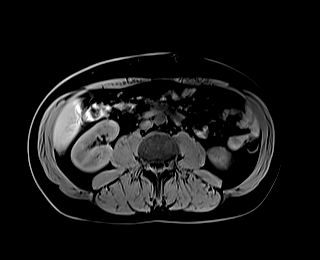
[im 53/80]
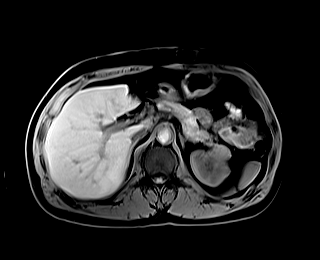
[im 80/80]
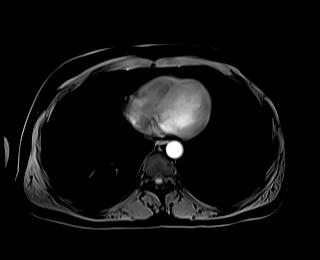

[Series 21: T1 dynamic · axial · 3.0mm · 1.25mm/px · z∈[-138,+99]mm · 4 of 80 slices shown (2 of 4)]
[im 1/80]
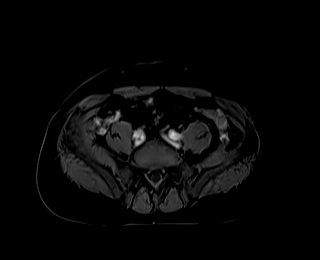
[im 27/80]
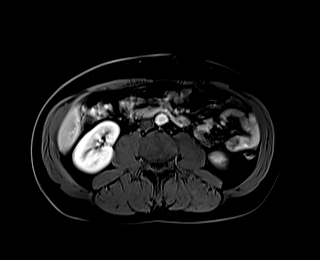
[im 53/80]
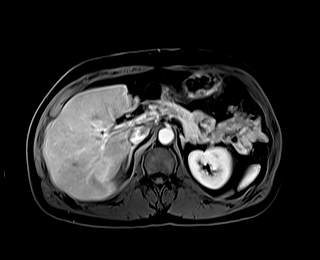
[im 80/80]
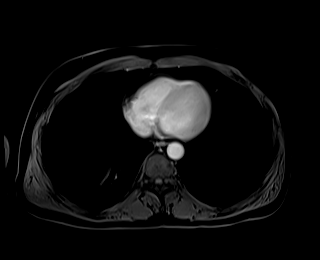

[Series 23: T1 dynamic · axial · 3.0mm · 1.25mm/px · z∈[-138,+99]mm · 4 of 80 slices shown (3 of 4)]
[im 1/80]
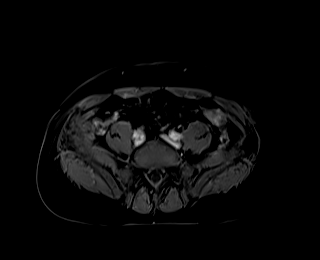
[im 27/80]
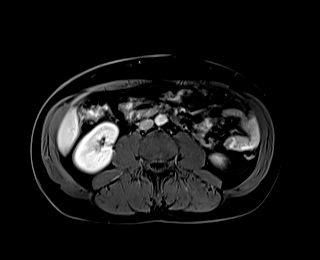
[im 53/80]
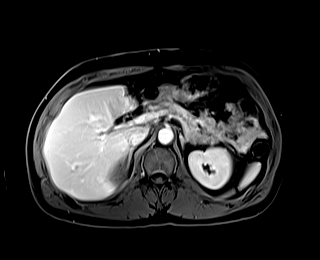
[im 80/80]
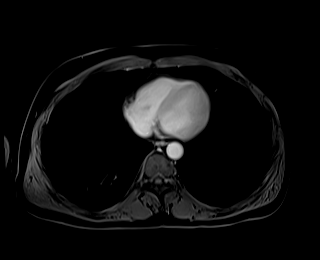

[Series 25: T1 dynamic · axial · 3.0mm · 1.25mm/px · z∈[-138,-60]mm · 2 of 80 slices shown (4 of 4)]
[im 1/80]
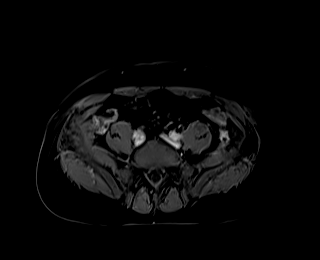
[im 27/80]
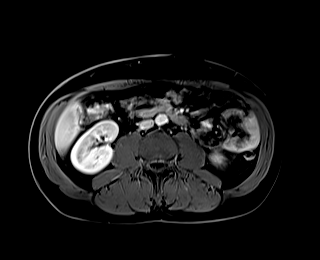

[35 of 48 positions shown; findings below may reference images not displayed]

FINDINGS: Lower chest: No acute findings.

Hepatobiliary: No mass or other parenchymal abnormality identified.

Pancreas: No mass, inflammatory changes, or other parenchymal
abnormality identified. Pancreatic duct normal caliber. Finding on
recent ultrasound likely related to the distal stomach anterior to
the pancreas.

Spleen:  Within normal limits in size and appearance.

Adrenals/Urinary Tract: 13 mm cyst in the upper pole left kidney. No
hydronephrosis or enhancing renal mass identified bilaterally.
Adrenal glands appear within normal limits.

Stomach/Bowel: PEG tube noted.  No evidence of bowel obstruction.

Vascular/Lymphatic: No pathologically enlarged lymph nodes
identified. No abdominal aortic aneurysm demonstrated.

Other:  No ascites.

Musculoskeletal: No suspicious bone lesions identified.
IMPRESSION: 1. No pancreatic mass identified.
2. Left renal cyst.

## 2022-02-08 MED ORDER — GADOBUTROL 1 MMOL/ML IV SOLN
7.0000 mL | Freq: Once | INTRAVENOUS | Status: AC | PRN
  Administered 2022-02-08: 7 mL via INTRAVENOUS

## 2022-04-02 ENCOUNTER — Other Ambulatory Visit: Payer: Self-pay | Admitting: Neurology

## 2022-04-19 ENCOUNTER — Emergency Department (HOSPITAL_COMMUNITY)
Admission: EM | Admit: 2022-04-19 | Discharge: 2022-04-19 | Disposition: A | Discharge status: Other Acute Inpatient Hospital | Payer: Medicaid Other | Attending: Emergency Medicine | Admitting: Emergency Medicine

## 2022-04-19 ENCOUNTER — Encounter: Payer: Self-pay | Admitting: Hematology and Oncology

## 2022-04-19 ENCOUNTER — Emergency Department (HOSPITAL_COMMUNITY): Payer: Medicaid Other

## 2022-04-19 ENCOUNTER — Encounter (HOSPITAL_COMMUNITY): Payer: Self-pay

## 2022-04-19 DIAGNOSIS — I720 Aneurysm of carotid artery: Secondary | ICD-10-CM

## 2022-04-19 DIAGNOSIS — Z931 Gastrostomy status: Secondary | ICD-10-CM | POA: Diagnosis not present

## 2022-04-19 DIAGNOSIS — R000 Tachycardia, unspecified: Secondary | ICD-10-CM | POA: Diagnosis not present

## 2022-04-19 DIAGNOSIS — S1190XA Unspecified open wound of unspecified part of neck, initial encounter: Secondary | ICD-10-CM | POA: Insufficient documentation

## 2022-04-19 DIAGNOSIS — C760 Malignant neoplasm of head, face and neck: Secondary | ICD-10-CM

## 2022-04-19 DIAGNOSIS — Z85828 Personal history of other malignant neoplasm of skin: Secondary | ICD-10-CM | POA: Diagnosis not present

## 2022-04-19 DIAGNOSIS — R58 Hemorrhage, not elsewhere classified: Secondary | ICD-10-CM

## 2022-04-19 DIAGNOSIS — X58XXXA Exposure to other specified factors, initial encounter: Secondary | ICD-10-CM | POA: Diagnosis not present

## 2022-04-19 LAB — CBC
HCT: 30.5 % — ABNORMAL LOW (ref 39.0–52.0)
Hemoglobin: 9.7 g/dL — ABNORMAL LOW (ref 13.0–17.0)
MCH: 26.3 pg (ref 26.0–34.0)
MCHC: 31.8 g/dL (ref 30.0–36.0)
MCV: 82.7 fL (ref 80.0–100.0)
Platelets: 421 10*3/uL — ABNORMAL HIGH (ref 150–400)
RBC: 3.69 MIL/uL — ABNORMAL LOW (ref 4.22–5.81)
RDW: 11.9 % (ref 11.5–15.5)
WBC: 8.7 10*3/uL (ref 4.0–10.5)
nRBC: 0 % (ref 0.0–0.2)

## 2022-04-19 LAB — I-STAT CHEM 8, ED
BUN: 17 mg/dL (ref 6–20)
Calcium, Ion: 1.07 mmol/L — ABNORMAL LOW (ref 1.15–1.40)
Chloride: 95 mmol/L — ABNORMAL LOW (ref 98–111)
Creatinine, Ser: 0.8 mg/dL (ref 0.61–1.24)
Glucose, Bld: 171 mg/dL — ABNORMAL HIGH (ref 70–99)
HCT: 29 % — ABNORMAL LOW (ref 39.0–52.0)
Hemoglobin: 9.9 g/dL — ABNORMAL LOW (ref 13.0–17.0)
Potassium: 3.9 mmol/L (ref 3.5–5.1)
Sodium: 132 mmol/L — ABNORMAL LOW (ref 135–145)
TCO2: 26 mmol/L (ref 22–32)

## 2022-04-19 LAB — COMPREHENSIVE METABOLIC PANEL
ALT: 55 U/L — ABNORMAL HIGH (ref 0–44)
AST: 44 U/L — ABNORMAL HIGH (ref 15–41)
Albumin: 2.7 g/dL — ABNORMAL LOW (ref 3.5–5.0)
Alkaline Phosphatase: 92 U/L (ref 38–126)
Anion gap: 12 (ref 5–15)
BUN: 16 mg/dL (ref 6–20)
CO2: 26 mmol/L (ref 22–32)
Calcium: 9.4 mg/dL (ref 8.9–10.3)
Chloride: 96 mmol/L — ABNORMAL LOW (ref 98–111)
Creatinine, Ser: 0.97 mg/dL (ref 0.61–1.24)
GFR, Estimated: >60 mL/min (ref >60)
Glucose, Bld: 176 mg/dL — ABNORMAL HIGH (ref 70–99)
Potassium: 4 mmol/L (ref 3.5–5.1)
Sodium: 134 mmol/L — ABNORMAL LOW (ref 135–145)
Total Bilirubin: 0.3 mg/dL (ref 0.3–1.2)
Total Protein: 7.5 g/dL (ref 6.5–8.1)

## 2022-04-19 LAB — SAMPLE TO BLOOD BANK

## 2022-04-19 LAB — PROTIME-INR
INR: 1.3 — ABNORMAL HIGH (ref 0.8–1.2)
Prothrombin Time: 16 seconds — ABNORMAL HIGH (ref 11.4–15.2)

## 2022-04-19 LAB — ABO/RH: ABO/RH(D): B POS

## 2022-04-19 LAB — PREPARE RBC (CROSSMATCH)

## 2022-04-19 LAB — ETHANOL: Alcohol, Ethyl (B): <10 mg/dL (ref <10)

## 2022-04-19 LAB — LACTIC ACID, PLASMA: Lactic Acid, Venous: 3.2 mmol/L (ref 0.5–1.9)

## 2022-04-19 IMAGING — CT CT ANGIO NECK
1 of 7 series · 7 of 33 positions shown · IV contrast (APPLIED)
Comparison: [DATE]

CLINICAL DATA: Blunt neck trauma

EXAM:
CT ANGIOGRAPHY NECK
TECHNIQUE: Multidetector CT imaging of the neck was performed using the
standard protocol during bolus administration of intravenous
contrast. Multiplanar CT image reconstructions and MIPs were
obtained to evaluate the vascular anatomy. Carotid stenosis
measurements (when applicable) are obtained utilizing NASCET
criteria, using the distal internal carotid diameter as the
denominator.
RADIATION DOSE REDUCTION: This exam was performed according to the
departmental dose-optimization program which includes automated
exposure control, adjustment of the mA and/or kV according to
patient size and/or use of iterative reconstruction technique.

[Series 7: ax thins · axial · 0.29mm/px · z∈[-434,-241]mm · 7 of 270 slices shown]
[im 34/270  soft-tissue]
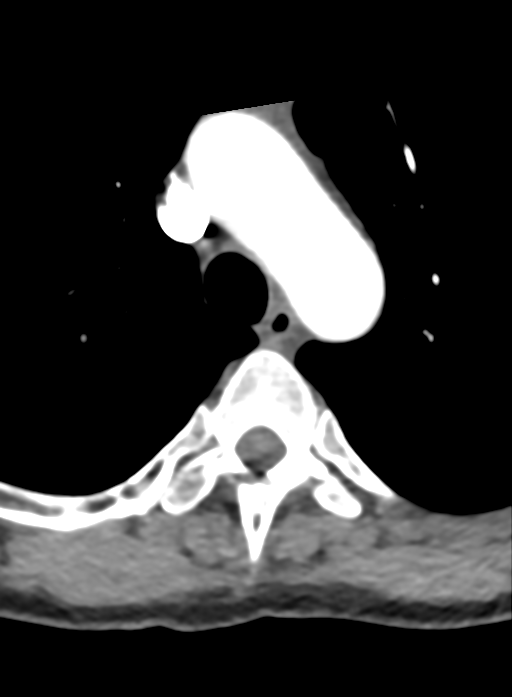
[im 68/270  bone]
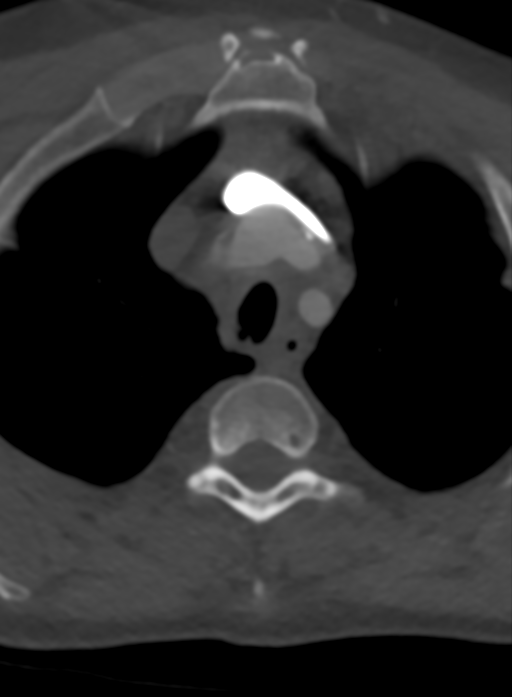
[im 101/270  soft-tissue]
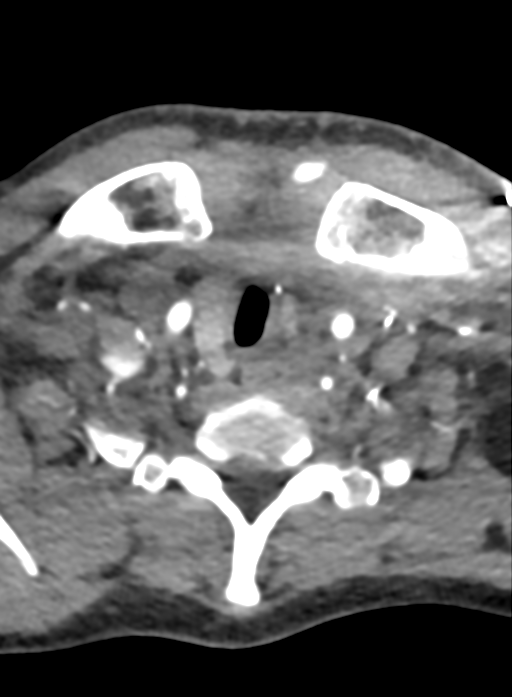
[im 135/270  bone]
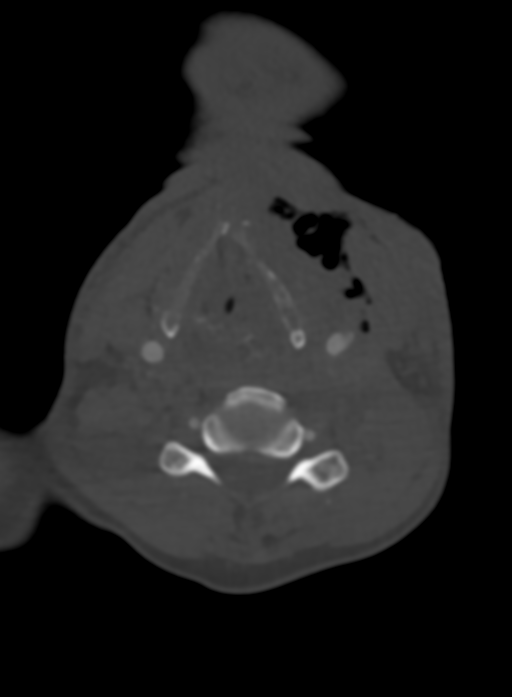
[im 169/270  soft-tissue]
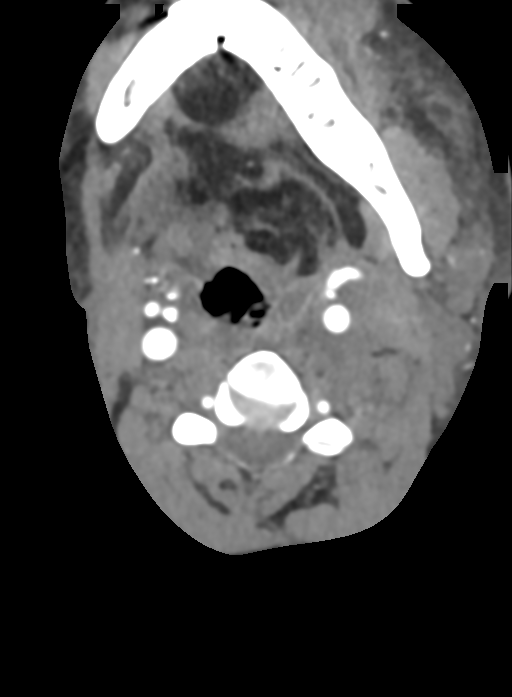
[im 202/270  bone]
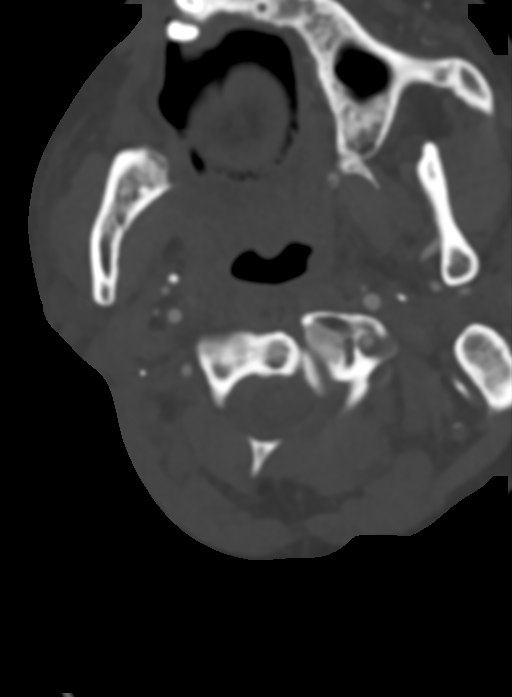
[im 236/270  soft-tissue]
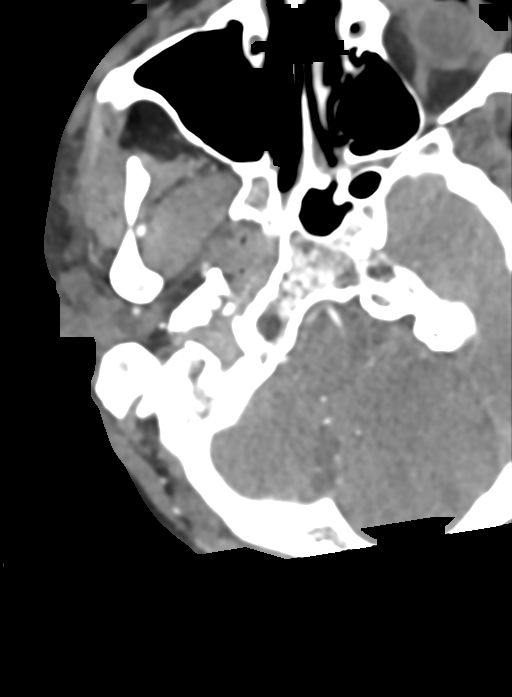

[7 of 33 positions shown; findings below may reference images not displayed]

FINDINGS: Aortic arch: Two-vessel arch with a common origin of the
brachiocephalic and left common carotid arteries. Imaged portion
shows no evidence of aneurysm or dissection. No significant stenosis
of the major arch vessel origins.

Right carotid system: No evidence of vascular trauma. No evidence of
dissection, occlusion, or hemodynamically significant stenosis
(greater than 50%). Atherosclerotic disease at the bifurcation and
in the proximal ICA is not hemodynamically significant.

Left carotid system: In the distal left CCA, there is an area of
vascular irregularity with an outpouching measuring up to 3 mm
(series 7, image 136), concerning for pseudoaneurysm. This
correlates with a portion of the carotid that was with in close
proximity to a previously noted left neck necrotic tumor. Just
distal there is mild narrowing of the distal common carotid,
approximately 25% luminal narrowing. No evidence of definite
dissection flap. The bifurcation, internal carotid artery, and
external carotid artery are unremarkable.

Vertebral arteries: No evidence of vascular trauma. No evidence of
dissection, occlusion, or hemodynamically significant stenosis
(greater than 50%).

Skeleton: No acute osseous abnormality.  Prior mandibular fixation.

Other neck: Large soft tissue defect in the anterior left neck,
extending from just above the clavicles to near the angle of the
mandible, approximately 4.3 cm in craniocaudal dimension and
extending up to 5.5 cm in the AP dimension. This correlates with the
location of a necrotic tumor seen on the [DATE] CT. Additional
postsurgical and postradiation changes are noted in the left neck.

Upper chest: No focal pulmonary opacity or pleural effusion. No
evidence of traumatic injury in the chest.
IMPRESSION: Large soft tissue defect in the anterior left neck, likely related
to a previously noted necrotic mass, with a focal outpouching from
the distal left common carotid, concerning for pseudoaneurysm,
raising the risk of carotid blowout, given the carotid's relation to
the tumor.

Imaging results were communicated on [DATE] at [DATE] to
provider [facility] via secure text paging.
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
## 2022-04-19 MED ORDER — SODIUM CHLORIDE 0.9 % IV SOLN
INTRAVENOUS | Status: AC | PRN
  Administered 2022-04-19: 150 mL/h via INTRAVENOUS

## 2022-04-19 MED ORDER — SODIUM CHLORIDE 0.9% IV SOLUTION: Freq: Once | INTRAVENOUS | Status: DC

## 2022-04-19 MED ORDER — IOHEXOL 350 MG/ML SOLN
75.0000 mL | Freq: Once | INTRAVENOUS | Status: AC | PRN
Start: 2022-04-19 — End: 2022-04-19
  Administered 2022-04-19: 75 mL via INTRAVENOUS

## 2022-04-19 NOTE — ED Notes (Signed)
[facility] at bedside 

## 2022-04-19 NOTE — Consult Note (Signed)
ASSESSMENT & PLAN   PSEUDOANEURYSM LEFT COMMON CAROTID ARTERY: This patient CT angio of the neck shows a pseudoaneurysm in the common carotid artery adjacent to the necrotic neck mass.  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  I am unable to locate his records from [facility].  This is clearly a life-threatening problem and he is at high risk for bleeding again.  I do not think this could be addressed surgically given the contracture in his neck with the necrotic mass secondary to radiation therapy.  The artery itself is likely compromised from the necrotic mass in the neck.  1 potential option might be a covered stent in the neck.  Given that his cancer has been treated at [facility] and may have been following this neck wound Dr. Ralene Bathe is trying to arrange urgent transfer to that facility.  The other consideration would be palliative care.  I have explained the seriousness of the situation with the patient including his risk for life-threatening bleeding.  There is no family available.  He has a neighbor who brought him tonight.  REASON FOR CONSULT:    Pseudoaneurysm left common carotid artery.  The consult is requested by Dr. Ralene Bathe  HPI:   [name] is a 46 y.o. 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  I am unable to locate the records on care everywhere I believe because of a medical record issue and a duplicate medical record number.  History is obtained from the patient and it is difficult to understand him because he has had previous tongue resection for a cancer.  He is also had extensive radiation therapy to his neck and the best I can tell he has had a wound on the neck for a year and is followed at [facility].  It looks like he is undergone palliative radiation therapy with advanced head and neck cancer.  He apparently awoke  with bleeding from the left neck which was controlled by the paramedics.  He came to the emergency department here and had a CT angio of the neck which shows evidence of a pseudoaneurysm in the midportion of the common carotid artery.  This was felt to likely be the source of bleeding.  Currently he is not actively bleeding.  Past Medical History:  Diagnosis Date   Cancer ([place])     No family history on file.  SOCIAL HISTORY: Social History   Tobacco Use   Smoking status: Not on file   Smokeless tobacco: Not on file  Substance Use Topics   Alcohol use: Not on file    No Known Allergies  Current Facility-Administered Medications  Medication Dose Route Frequency Provider Last Rate Last Admin   0.9 %  sodium chloride infusion (Manually program via Guardrails IV Fluids)   Intravenous Once Quintella Reichert, MD       0.9 %  sodium chloride infusion   Intravenous Continuous PRN Quintella Reichert, MD 150 mL/hr at 04/19/22 0125 150 mL/hr at 04/19/22 0125   No current outpatient medications on file.    REVIEW OF SYSTEMS: Unable to obtain  PHYSICAL EXAM:   Vitals:   04/19/22 0118 04/19/22 0130 04/19/22 0145 04/19/22 0209  BP:  126/87 116/84 (!) 136/92  Pulse:  (!) 132 (!) 110 (!) 108  Resp:  '17 20 20  '$ Temp: 97.7 F (36.5  C)     TempSrc: Temporal     SpO2:  100% 100% 100%  Weight:      Height:       Body mass index is 18.46 kg/m. GENERAL: The patient is a poorly nourished male, in no acute distress. The vital signs are documented above. CARDIAC: He is slightly tachycardic. HEENT: He has a wound in the left neck.  He has significant contracture in the neck from radiation therapy.  Currently there is no active bleeding.     VASCULAR: He has palpable radial pulses. He has palpable femoral, dorsalis pedis, posterior tibial pulses bilaterally. PULMONARY: There is good air exchange bilaterally with wet Rales bilaterally. ABDOMEN: Soft and non-tender.  He has a G-tube in  place. MUSCULOSKELETAL: There are no major deformities. NEUROLOGIC: No focal weakness or paresthesias are detected. PSYCHIATRIC: The patient has a normal affect.  DATA:    CT ANGIO NECK: I have reviewed the images of the CT angio of the neck which does show a pseudoaneurysm in the midportion of the left common carotid artery.  There is a's small focal outpouching noted.  On my review of the films it looks like there is some narrowing at the origin of the common carotid artery although I spoke with the radiologist who was reading these films remotely who feels that this is artifact and that the origin of the common carotid artery does not have significant narrowing.  Deitra Mayo [facility]

## 2022-04-19 NOTE — ED Notes (Signed)
Dr .Ralene Bathe speaking with [facility] for potential transfer

## 2022-04-19 NOTE — ED Notes (Signed)
Patient transported to CT with TRN.  
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
## 2022-04-19 NOTE — ED Notes (Signed)
Pt comes via [facility], pt states that he woke up to go to the bathroom and was bleeding from his L neck, deep adipose tissue showing, raspy voice, hypotensive with EMS

## 2022-04-19 NOTE — ED Notes (Signed)
Second unit of RBC sent with [facility] to [facility]
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
## 2022-04-19 NOTE — ED Notes (Signed)
Called [facility] to transport patient  they are in route

## 2022-04-19 NOTE — ED Provider Notes (Signed)
[facility] EMERGENCY DEPARTMENT Provider Note   CSN: [id number] Arrival date & time: 04/19/22  0055     History  Chief Complaint  Patient presents with   Neck Injury    [name] is a 46 y.o. male.  The history is provided by the patient, the EMS personnel and medical records.  Neck Injury  [name] is a 46 y.o. male who presents to the Emergency Department complaining of neck injury.  He presents to the emergency department by EMS as a level 1 trauma alert following a laceration to the left neck.  Per report he woke up and found blood in his bed.  No reports of trauma or self-harm.  EMS reports pressure in the 50D systolic, tachycardic.  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  He is followed at [facility] for treatment.  Patient denies any trouble breathing, states his voice is at his baseline.     Home Medications Prior to Admission medications   Not on File      Allergies    Patient has no known allergies.    Review of Systems   Review of Systems  All other systems reviewed and are negative.   Physical Exam Updated Vital Signs BP 124/80   Pulse (!) 104   Temp 97.7 F (36.5 C) (Temporal)   Resp (!) 22   Ht '5\' 9"'$  (1.753 m)   Wt 56.7 kg   SpO2 100%   BMI 18.46 kg/m  Physical Exam Vitals and nursing note reviewed.  Constitutional:      Appearance: He is well-developed.  HENT:     Head: Normocephalic and atraumatic.     Comments: There are chronic changes in the oropharynx.  1-1/2 finger trismus.  No blood in the posterior oropharynx Neck:     Comments: There are chronic skin changes to the anterior and lateral left neck.  There is a 3 cm irregular wounds to the left anterior and lateral neck with evidence of recent bleeding, no current active bleeding.  There is limited mobility of the neck. Cardiovascular:     Rate and  Rhythm: Regular rhythm. Tachycardia present.     Heart sounds: No murmur heard. Pulmonary:     Effort: Pulmonary effort is normal. No respiratory distress.     Breath sounds: Normal breath sounds. No stridor.     Comments: Raspy/muffeled voice Abdominal:     Palpations: Abdomen is soft.     Tenderness: There is no abdominal tenderness. There is no guarding or rebound.     Comments: Gastrostomy tube in place  Musculoskeletal:        General: No tenderness.  Skin:    General: Skin is warm and dry.  Neurological:     Mental Status: He is alert and oriented to person, place, and time.  Psychiatric:        Behavior: Behavior normal.     ED Results / Procedures / Treatments   Labs (all labs ordered are listed, but only abnormal results are displayed) Labs Reviewed  COMPREHENSIVE METABOLIC PANEL - Abnormal; Notable for the following components:      Result Value   Sodium 134 (*)    Chloride 96 (*)    Glucose, Bld 176 (*)    Albumin 2.7 (*)    AST 44 (*)    ALT 55 (*)  All other components within normal limits  CBC - Abnormal; Notable for the following components:   RBC 3.69 (*)    Hemoglobin 9.7 (*)    HCT 30.5 (*)    Platelets 421 (*)    All other components within normal limits  LACTIC ACID, PLASMA - Abnormal; Notable for the following components:   Lactic Acid, Venous 3.2 (*)    All other components within normal limits  PROTIME-INR - Abnormal; Notable for the following components:   Prothrombin Time 16.0 (*)    INR 1.3 (*)    All other components within normal limits  I-STAT CHEM 8, ED - Abnormal; Notable for the following components:   Sodium 132 (*)    Chloride 95 (*)    Glucose, Bld 171 (*)    Calcium, Ion 1.07 (*)    Hemoglobin 9.9 (*)    HCT 29.0 (*)    All other components within normal limits  RESP PANEL BY RT-PCR (FLU A&B, COVID) ARPGX2  ETHANOL  URINALYSIS, ROUTINE W REFLEX MICROSCOPIC  SAMPLE TO BLOOD BANK  PREPARE RBC (CROSSMATCH)  TYPE AND SCREEN   ABO/RH    EKG None  Radiology DG Chest Port 1 View  Result Date: 04/19/2022 CLINICAL DATA:  Laceration to the neck EXAM: PORTABLE CHEST 1 VIEW COMPARISON:  None Available. FINDINGS: The heart size and mediastinal contours are within normal limits. Both lungs are clear. The visualized skeletal structures are unremarkable. IMPRESSION: No active disease. Electronically Signed   By: Inez Catalina M.D.   On: 04/19/2022 02:22   CT ANGIO NECK W OR WO CONTRAST  Result Date: 04/19/2022 CLINICAL DATA:  Blunt neck trauma EXAM: CT ANGIOGRAPHY NECK TECHNIQUE: Multidetector CT imaging of the neck was performed using the standard protocol during bolus administration of intravenous contrast. Multiplanar CT image reconstructions and MIPs were obtained to evaluate the vascular anatomy. Carotid stenosis measurements (when applicable) are obtained utilizing NASCET criteria, using the distal internal carotid diameter as the denominator. RADIATION DOSE REDUCTION: This exam was performed according to the departmental dose-optimization program which includes automated exposure control, adjustment of the mA and/or kV according to patient size and/or use of iterative reconstruction technique. COMPARISON:  12/13/2021 FINDINGS: Aortic arch: Two-vessel arch with a common origin of the brachiocephalic and left common carotid arteries. Imaged portion shows no evidence of aneurysm or dissection. No significant stenosis of the major arch vessel origins. Right carotid system: No evidence of vascular trauma. No evidence of dissection, occlusion, or hemodynamically significant stenosis (greater than 50%). Atherosclerotic disease at the bifurcation and in the proximal ICA is not hemodynamically significant. 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 This correlates with a portion of the carotid that  was with in close proximity to a previously noted left neck necrotic tumor. Just distal there is mild narrowing of the distal common carotid, approximately 25% luminal narrowing. No evidence of definite dissection flap. The bifurcation, internal carotid artery, and external carotid artery are unremarkable. Vertebral arteries: No evidence of vascular trauma. No evidence of dissection, occlusion, or hemodynamically significant stenosis (greater than 50%). Skeleton: No acute osseous abnormality.  Prior mandibular fixation. Other neck: Large soft tissue defect in the anterior left neck, extending from just above the clavicles to near the angle of the mandible, approximately 4.3 cm in craniocaudal dimension and extending up to 5.5 cm in the AP dimension. This  correlates with the location of a necrotic tumor seen on the 12/13/2021 CT. Additional postsurgical and postradiation changes are noted in the left neck. Upper chest: No focal pulmonary opacity or pleural effusion. No evidence of traumatic injury in the chest. 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 Imaging results were communicated on 04/19/2022 at 1:29 am to provider [place] via secure text paging. Electronically Signed   By: Merilyn Baba M.D.   On: 04/19/2022 01:40    Procedures Procedures   CRITICAL CARE Performed by: Quintella Reichert   Total critical care time: 60 minutes  Critical care time was exclusive of separately billable procedures and treating other patients.  Critical care was necessary to treat or prevent imminent or life-threatening deterioration.  Critical care was time spent personally by me on the following activities: development of treatment plan with patient and/or surrogate as well  as nursing, discussions with consultants, evaluation of patient's response to treatment, examination of patient, obtaining history from patient or surrogate, ordering and performing treatments and interventions, ordering and review of laboratory studies, ordering and review of radiographic studies, pulse oximetry and re-evaluation of patient's condition.  Medications Ordered in ED Medications  0.9 %  sodium chloride infusion (150 mL/hr Intravenous New Bag/Given 04/19/22 0125)  0.9 %  sodium chloride infusion (Manually program via Guardrails IV Fluids) (has no administration in time range)  iohexol (OMNIPAQUE) 350 MG/ML injection 75 mL (75 mLs Intravenous Contrast Given 04/19/22 0120)    ED Course/ Medical Decision Making/ A&P                           Medical Decision Making Amount and/or Complexity of Data Reviewed Labs: ordered. Radiology: ordered.  Risk Prescription drug management.   Patient presents as a level 1 trauma alert following spontaneous wound to the left neck.  He was evaluated by trauma service at time of ED presentation.  He was found to have an airway intact, he does have changes to his voice and chronic changes to his oropharynx and neck.  He does have a wound to the left lateral anterior neck that is not actively bleeding.  Per EMS there was a large amount of blood on scene.  Patient states his voice and breathing are at his baseline.  CTA of the neck is concerning for spontaneous rupture of his tumor as well as internal carotid artery pseudoaneurysm.  Discussed with Dr. Scot Dock with vascular surgery, who evaluated the patient in the emergency department.  He recommends transfer to [facility] given complexity of the case. Discussed with Annita Brod with neurovascular surgery at [facility] accept the patient in transfer.  Hemoglobin today is down 1 g from records available at [facility].  Given his persistent tachycardia,  will transfuse 2 units PRBC to assist in stabilization for transfer.  Discussed with patient findings of studies, he is in agreement with transfer for ongoing care.  Discussed with patient that this is a very serious diagnosis with high risk to life.        Final Clinical Impression(s) / ED Diagnoses Final diagnoses:  None    Rx / DC Orders ED Discharge Orders     None  Quintella Reichert, MD 04/19/22 [phone]

## 2022-04-20 LAB — TYPE AND SCREEN
ABO/RH(D): B POS
Antibody Screen: NEGATIVE
Unit division: 0
Unit division: 0

## 2022-04-20 LAB — BPAM RBC
Blood Product Expiration Date: 202307122359
Blood Product Expiration Date: 202307132359
ISSUE DATE / TIME: 202306210236
ISSUE DATE / TIME: 202306210236
Unit Type and Rh: 7300
Unit Type and Rh: 7300

## 2022-04-21 ENCOUNTER — Ambulatory Visit: Payer: Medicaid Other | Admitting: Radiation Oncology

## 2022-05-16 ENCOUNTER — Other Ambulatory Visit: Payer: Self-pay

## 2022-05-16 ENCOUNTER — Emergency Department (HOSPITAL_COMMUNITY)
Admission: EM | Admit: 2022-05-16 | Discharge: 2022-05-16 | Disposition: A | Discharge status: Other Acute Inpatient Hospital | Payer: Medicaid Other | Attending: Emergency Medicine | Admitting: Emergency Medicine

## 2022-05-16 ENCOUNTER — Emergency Department (HOSPITAL_COMMUNITY): Payer: Medicaid Other

## 2022-05-16 DIAGNOSIS — R944 Abnormal results of kidney function studies: Secondary | ICD-10-CM | POA: Insufficient documentation

## 2022-05-16 DIAGNOSIS — J9509 Other tracheostomy complication: Secondary | ICD-10-CM | POA: Insufficient documentation

## 2022-05-16 DIAGNOSIS — Z85810 Personal history of malignant neoplasm of tongue: Secondary | ICD-10-CM | POA: Insufficient documentation

## 2022-05-16 DIAGNOSIS — J939 Pneumothorax, unspecified: Secondary | ICD-10-CM | POA: Insufficient documentation

## 2022-05-16 DIAGNOSIS — R7309 Other abnormal glucose: Secondary | ICD-10-CM | POA: Insufficient documentation

## 2022-05-16 DIAGNOSIS — I10 Essential (primary) hypertension: Secondary | ICD-10-CM | POA: Insufficient documentation

## 2022-05-16 DIAGNOSIS — J9501 Hemorrhage from tracheostomy stoma: Secondary | ICD-10-CM | POA: Diagnosis present

## 2022-05-16 LAB — CBC WITH DIFFERENTIAL/PLATELET
Abs Immature Granulocytes: 0.01 10*3/uL (ref 0.00–0.07)
Basophils Absolute: 0 10*3/uL (ref 0.0–0.1)
Basophils Relative: 1 %
Eosinophils Absolute: 0 10*3/uL (ref 0.0–0.5)
Eosinophils Relative: 0 %
HCT: 23.5 % — ABNORMAL LOW (ref 39.0–52.0)
Hemoglobin: 7.1 g/dL — ABNORMAL LOW (ref 13.0–17.0)
Immature Granulocytes: 0 %
Lymphocytes Relative: 8 %
Lymphs Abs: 0.5 10*3/uL — ABNORMAL LOW (ref 0.7–4.0)
MCH: 25.5 pg — ABNORMAL LOW (ref 26.0–34.0)
MCHC: 30.2 g/dL (ref 30.0–36.0)
MCV: 84.5 fL (ref 80.0–100.0)
Monocytes Absolute: 0.4 10*3/uL (ref 0.1–1.0)
Monocytes Relative: 7 %
Neutro Abs: 5.2 10*3/uL (ref 1.7–7.7)
Neutrophils Relative %: 84 %
Platelets: 589 10*3/uL — ABNORMAL HIGH (ref 150–400)
RBC: 2.78 MIL/uL — ABNORMAL LOW (ref 4.22–5.81)
RDW: 15 % (ref 11.5–15.5)
WBC: 6.2 10*3/uL (ref 4.0–10.5)
nRBC: 0 % (ref 0.0–0.2)

## 2022-05-16 LAB — COMPREHENSIVE METABOLIC PANEL
ALT: 16 U/L (ref 0–44)
AST: 13 U/L — ABNORMAL LOW (ref 15–41)
Albumin: 2.4 g/dL — ABNORMAL LOW (ref 3.5–5.0)
Alkaline Phosphatase: 81 U/L (ref 38–126)
Anion gap: 9 (ref 5–15)
BUN: 11 mg/dL (ref 6–20)
CO2: 25 mmol/L (ref 22–32)
Calcium: 8.7 mg/dL — ABNORMAL LOW (ref 8.9–10.3)
Chloride: 97 mmol/L — ABNORMAL LOW (ref 98–111)
Creatinine, Ser: 0.59 mg/dL — ABNORMAL LOW (ref 0.61–1.24)
GFR, Estimated: >60 mL/min (ref >60)
Glucose, Bld: 147 mg/dL — ABNORMAL HIGH (ref 70–99)
Potassium: 3.8 mmol/L (ref 3.5–5.1)
Sodium: 131 mmol/L — ABNORMAL LOW (ref 135–145)
Total Bilirubin: 0.7 mg/dL (ref 0.3–1.2)
Total Protein: 6.6 g/dL (ref 6.5–8.1)

## 2022-05-16 LAB — ABO/RH: ABO/RH(D): B POS

## 2022-05-16 LAB — PREPARE RBC (CROSSMATCH)

## 2022-05-16 MED ORDER — SODIUM CHLORIDE 0.9 % IV SOLN
10.0000 mL/h | Freq: Once | INTRAVENOUS | Status: AC
  Administered 2022-05-16: 10 mL/h via INTRAVENOUS

## 2022-05-16 MED ORDER — IOHEXOL 350 MG/ML SOLN
75.0000 mL | Freq: Once | INTRAVENOUS | Status: AC | PRN
Start: 2022-05-16 — End: 2022-05-16
  Administered 2022-05-16: 75 mL via INTRAVENOUS

## 2022-05-16 MED ORDER — FENTANYL CITRATE PF 50 MCG/ML IJ SOSY
50.0000 ug | PREFILLED_SYRINGE | Freq: Once | INTRAMUSCULAR | Status: DC
  Filled 2022-05-16: qty 1

## 2022-05-16 MED ORDER — ACETAMINOPHEN 325 MG PO TABS
650.0000 mg | ORAL_TABLET | Freq: Once | ORAL | Status: AC
  Administered 2022-05-16: 650 mg via JEJUNOSTOMY
  Filled 2022-05-16: qty 2

## 2022-05-16 MED ORDER — TRAMADOL HCL 50 MG PO TABS
50.0000 mg | ORAL_TABLET | Freq: Once | ORAL | Status: AC
  Administered 2022-05-16: 50 mg via JEJUNOSTOMY
  Filled 2022-05-16: qty 1

## 2022-05-16 NOTE — ED Triage Notes (Signed)
Pt BIB EMS for bleeding around his stoma, bleeding controlled upon arrival. PT has a Hx of tumors in his neck rupturing and believes that is what happened today.    100 HR  100% RA  112/80

## 2022-05-16 NOTE — Consult Note (Addendum)
NAME:  [name], MRN:  [id number], DOB:  1976-02-24, LOS: 0 ADMISSION DATE:  05/16/2022, CONSULTATION DATE:  05/16/22 REFERRING MD:  Regenia Skeeter - ED, CHIEF COMPLAINT:  bleeding from neck   History of Present Illness:  46 y.o. 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  Patient recently presented to the ED 03/2022 with neck bleeding.  Transferred to [place] tracheostomy.  Discharged home.  He was taught and family was taught how to take care of tracheostomy.  He reports fairly frequent ER cannula changes.  He changed or removed it and reinserted the entire tracheostomy once a few days ago.  Noted tracheal bleeding this morning.  Concern given his history of carotid blowout.  Present to the ED.  CTA neck obtained which shows moderate interpretation false passage of the trach into the left neck although that space appears relatively obliterated due to presumed tumor and radiation.  He also demonstrated a moderate to large pneumothorax on the apices.  Review of CT scan ordered by oncology 05/05/2022 reports of moderate hydropneumothorax.  Does not really address in the note but does mention that he is asymptomatic.  His last chest x-ray 04/22/2022 at outside hospital reports "no discernible pneumothorax."  Repeat chest x-ray obtained today reveals large left-sided pneumothorax with mild displacement of trachea to the right.  Labs notable for worsening anemia hemoglobin 7.1.  Hyponatremia present 131, stable kidney function slightly improved with creatinine 0.59.  He was transfused 1 units of packed red blood cells in the ED.  He is fairly stoic on exam.  Most of history gathered by family member at bedside.  Pertinent  Medical History  Head and neck cancer status postradiation, currently recently treated  with additional radiation and immunotherapy  Significant Hospital Events: Including procedures, antibiotic start and stop dates in addition to other pertinent events   7/18 presents to the ED with bleeding from neck with findings on CT scan of false passage of trachea and relatively large pneumothorax confirmed on chest x-ray  Interim History / Subjective:  As above  Objective   Blood pressure 118/73, pulse 90, temperature 98.7 F (37.1 C), temperature source Axillary, resp. rate 16, SpO2 100 %.       No intake or output data in the 24 hours ending 05/16/22 1022 There were no vitals filed for this visit.  Examination: General: Chronically ill appearing, in NAD Neck: Trach ties are soaked and dirty, crusted blood around stoma as well as on chest Lungs: Clear bilaterally, normal work of breathing Cardiovascular: Regular rate and rhythm, no murmur Abdomen: Soft, nontender Neuro: Unable to phonate, moves all extremities  Resolved Hospital Problem list   N/a  Assessment & Plan:   Bleeding from neck/tracheal site: Incidental false passage of trach into the left neck as demonstrated on CT scan.  Insertion currently abuts carotid. --Recommend transfer to [place] where prior ENT care was for consideration of revision versus decannulation, recommend surgical environment given location of false passage currently and concern for potential tamponade of underlying bleeding.  Pneumothorax: On the left.  Large.  Reported moderate hydropneumothorax on CT [facility] at [facility] ordered by oncologist on 05/05/2022.  No action taken. Suspect has grown in the interim.  Presumably spontaneous and related to coughing given no evidence of subcutaneous air on plain film or  exam.  He is asymptomatic, satting 100% on room air.  Do recommend chest tube was placed this admission however defer to being placed at [facility] if deemed appropriate by accepting team.  If patient is unable to  transfer to [facility], we are happy to place chest tube and see if the lung will reinflate.  Suspect will reinflate without structural disease this been demonstrated in the past.  Possible its been collapse for longer than 2 weeks, last chest x-ray was 6/24 prior to 7/7.  Possible lung will not reexpand if it has been collapsed long enough.  Plan of care discussed extensively with Dr. Regenia Skeeter as well as patient and family at bedside.  Best Practice (right click and "Reselect all SmartList Selections" daily)   Per Primary  Labs   CBC: Recent Labs  Lab 05/16/22 0647  WBC 6.2  NEUTROABS 5.2  HGB 7.1*  HCT 23.5*  MCV 84.5  PLT 589*    Basic Metabolic Panel: Recent Labs  Lab 05/16/22 0647  NA 131*  K 3.8  CL 97*  CO2 25  GLUCOSE 147*  BUN 11  CREATININE 0.59*  CALCIUM 8.7*   GFR: CrCl cannot be calculated (Unknown ideal weight.). Recent Labs  Lab 05/16/22 0647  WBC 6.2    Liver Function Tests: Recent Labs  Lab 05/16/22 0647  AST 13*  ALT 16  ALKPHOS 81  BILITOT 0.7  PROT 6.6  ALBUMIN 2.4*   No results for input(s): "LIPASE", "AMYLASE" in the last 168 hours. No results for input(s): "AMMONIA" in the last 168 hours.  ABG    Component Value Date/Time   TCO2 26 04/19/2022 0114     Coagulation Profile: No results for input(s): "INR", "PROTIME" in the last 168 hours.  Cardiac Enzymes: No results for input(s): "CKTOTAL", "CKMB", "CKMBINDEX", "TROPONINI" in the last 168 hours.  HbA1C: Hgb A1c MFr Bld  Date/Time Value Ref Range Status  12/14/2020 10:32 AM 5.6 4.8 - 5.6 % Final    Comment:             Prediabetes: 5.7 - 6.4          Diabetes: >6.4          Glycemic control for adults with diabetes: <7.0     CBG: No results for input(s): "GLUCAP" in the last 168 hours.  Review of Systems:   Unable to obtain as patient can not phonate and is not enthusiastic about non-verbal or written communication  Past Medical History:  He,  has a past  medical history of Cancer ([place]) and Cancer ([place]).   Surgical History:   Past Surgical History:  Procedure Laterality Date   BIOPSY  06/27/2021   Procedure: BIOPSY;  Surgeon: Rush Landmark Telford Nab., MD;  Location: [facility];  Service: Gastroenterology;;   COLONOSCOPY WITH PROPOFOL N/A 06/27/2021   Procedure: COLONOSCOPY WITH PROPOFOL;  Surgeon: Irving Copas., MD;  Location: [facility];  Service: Gastroenterology;  Laterality: N/A;   DIRECT LARYNGOSCOPY N/A 09/05/2020   Procedure: DIRECT LARYNGOSCOPY;  Surgeon: Marcina Millard, MD;  Location: [place];  Service: ENT;  Laterality: N/A;   ESOPHAGOGASTRODUODENOSCOPY (EGD) WITH PROPOFOL N/A 06/27/2021   Procedure: ESOPHAGOGASTRODUODENOSCOPY (EGD) WITH PROPOFOL;  Surgeon: Irving Copas., MD;  Location: [facility];  Service: Gastroenterology;  Laterality: N/A;   ESOPHAGOSCOPY N/A 09/05/2020   Procedure: ESOPHAGOSCOPY;  Surgeon: Marcina Millard, MD;  Location: [place];  Service: ENT;  Laterality: N/A;   FRACTURE SURGERY     IR  REPLACE G-TUBE SIMPLE WO FLUORO  05/29/2021   TONGUE BIOPSY N/A 09/05/2020   Procedure: TONGUE BIOPSY;  Surgeon: Marcina Millard, MD;  Location: [place];  Service: ENT;  Laterality: N/A;     Social History:   reports that he does not have a smoking history on file. He has never used smokeless tobacco. He reports that he does not currently use alcohol. He reports that he does not currently use drugs after having used the following drugs: Marijuana.   Family History:  His family history includes Lung cancer in his maternal grandmother, mother, and sister; Stroke in his father.   Allergies No Known Allergies   Home Medications  Prior to Admission medications   Medication Sig Start Date End Date Taking? Authorizing Provider  amitriptyline (ELAVIL) 25 MG tablet 1 tablet at bedtime 04/03/22   Metta Clines R, DO  amoxicillin-clavulanate (AUGMENTIN) 875-125 MG tablet Take 1 tablet by mouth 2 (two) times  daily. 06/10/21   [provider]  butalbital-acetaminophen-caffeine (FIORICET) 50-325-40 MG tablet Take 1 tablet by mouth every 8 (eight) hours as needed for headache. 10/25/21 10/25/22  Horton, Alvin Critchley, DO  ferrous sulfate 325 (65 FE) MG EC tablet Take 1 tablet (325 mg total) by mouth 2 (two) times daily with a meal. 04/11/21   Benay Pike, MD  Nutritional Supplements (FEEDING SUPPLEMENT, OSMOLITE 1.5 CAL,) LIQD Increase Osmolite 1.5 to 8 cartons daily with 60 mL free water before and after each bolus feeding. Feed via low profile gastrostomy tube. Provides 100% estimated needs. 2840 kcal, 119 gm pro. 02/03/21   Eppie Gibson, MD  omeprazole (FIRST-OMEPRAZOLE) 2 mg/mL SUSP oral suspension Take 20 mLs (40 mg total) by mouth daily. 06/27/21   Mansouraty, Telford Nab., MD     Critical care time: n/a     I spent 65 minutes in the care of the patient including review of records, face-to-face visit, coordination of care.

## 2022-05-16 NOTE — ED Provider Notes (Signed)
11:26 AM I discussed with [facility] ENT, Dr. Weldon Inches.  We discussed the CT findings.  He agrees for transfer to [facility] for ENT evaluation, especially of bleeding and eval for sentinel bleeding.  We did discuss the pneumothorax which seems to be stable from 7/11.  Patient is not hypoxic on room air.  They will consult pulmonology but after discussion with pulmonology here, Dr. Silas Flood, does not seem like he needs an immediate chest tube and it would be okay to wait given its been almost 2 weeks.  His treacheostomy appears to be in a false passage which was likely from when he replaced a few days ago, but at the same time he is breathing well and does not appear to need it emergently replaced and is stable for transfer.   Sherwood Gambler, MD 05/16/22 1255

## 2022-05-16 NOTE — ED Notes (Signed)
[facility] called to set up transfer to [facility] ED, Spoke with Iona Beard. Accepting provider at [place] ED is Dr. Ervin Knack.

## 2022-05-16 NOTE — ED Provider Notes (Signed)
[facility] EMERGENCY DEPARTMENT Provider Note   CSN: [id number] Arrival date & time: 05/16/22  4098     History  No chief complaint on file.   [name] is a 46 y.o. male with a PMHx of squamous cell carcinoma of tongue, HTN, who presents to the ED complaining of bleeding stoma onset today. Notes that he has gauze to his left sided neck that he replaces twice a day, he last changed it last night. Denies chest pain, shortness of breath, abdominal pain, nausea, vomiting. Didn't have a trach placed prior to his previous transfer and admission to [facility] on 04/19/22. Patient notes that he changed his trach himself ~5 days ago.   Per pt chart review: Patient has a history of squamous cell tongue carcinoma and hypertension.  Was treated with radiation therapy with Dr. Isidore Moos from [facility]. Pt was seen in the ED on 04/19/22 and [facility]. Pt had a tracheotomy placed by ENT specialist, Dr. Nicolette Bang on 04/21/22.    The history is provided by the patient and a relative. No language interpreter was used.       Home Medications Prior to Admission medications   Medication Sig Start Date End Date Taking? Authorizing Provider  amitriptyline (ELAVIL) 25 MG tablet 1 tablet at bedtime 04/03/22   Metta Clines R, DO  amoxicillin-clavulanate (AUGMENTIN) 875-125 MG tablet Take 1 tablet by mouth 2 (two) times daily. 06/10/21   [provider]  butalbital-acetaminophen-caffeine (FIORICET) 50-325-40 MG tablet Take 1 tablet by mouth every 8 (eight) hours as needed for headache. 10/25/21 10/25/22  Horton, Alvin Critchley, DO  ferrous sulfate 325 (65 FE) MG EC tablet Take 1 tablet (325 mg total) by mouth 2 (two) times daily with a meal. 04/11/21   Benay Pike, MD  Nutritional Supplements (FEEDING SUPPLEMENT, OSMOLITE 1.5 CAL,) LIQD Increase Osmolite 1.5 to 8 cartons daily with 60 mL free water before and after each bolus feeding. Feed via low profile  gastrostomy tube. Provides 100% estimated needs. 2840 kcal, 119 gm pro. 02/03/21   Eppie Gibson, MD  omeprazole (FIRST-OMEPRAZOLE) 2 mg/mL SUSP oral suspension Take 20 mLs (40 mg total) by mouth daily. 06/27/21   Mansouraty, Telford Nab., MD      Allergies    Patient has no known allergies.    Review of Systems   Review of Systems  Respiratory:  Negative for shortness of breath.   Cardiovascular:  Negative for chest pain.  Gastrointestinal:  Negative for abdominal pain, nausea and vomiting.  All other systems reviewed and are negative.   Physical Exam Updated Vital Signs BP 111/72   Pulse 92   Temp 98.7 F (37.1 C) (Axillary)   Resp (!) 23   SpO2 100%  Physical Exam Vitals and nursing note reviewed.  Constitutional:      General: He is not in acute distress.    Appearance: He is not diaphoretic.  HENT:     Head: Normocephalic and atraumatic.     Comments: Swelling noted to face without overlying skin changes.     Mouth/Throat:     Pharynx: No oropharyngeal exudate.  Eyes:     General: No scleral icterus.    Conjunctiva/sclera: Conjunctivae normal.  Neck:     Comments: ~3 cm wound noted to left anterior/lateral neck underneath trach band. Bleeding controlled. Limited ROM of neck. Malodorous secretions noted. Cardiovascular:     Rate and Rhythm: Normal rate and regular rhythm.  Pulses: Normal pulses.     Heart sounds: Normal heart sounds.  Pulmonary:     Effort: Pulmonary effort is normal. No respiratory distress.     Breath sounds: Normal breath sounds. No wheezing.  Abdominal:     General: Bowel sounds are normal.     Palpations: Abdomen is soft. There is no mass.     Tenderness: There is no abdominal tenderness. There is no guarding or rebound.  Musculoskeletal:        General: Normal range of motion.     Cervical back: Neck supple.  Skin:    General: Skin is warm and dry.  Neurological:     Mental Status: He is alert.  Psychiatric:        Behavior: Behavior  normal.     ED Results / Procedures / Treatments   Labs (all labs ordered are listed, but only abnormal results are displayed) Labs Reviewed  COMPREHENSIVE METABOLIC PANEL - Abnormal; Notable for the following components:      Result Value   Sodium 131 (*)    Chloride 97 (*)    Glucose, Bld 147 (*)    Creatinine, Ser 0.59 (*)    Calcium 8.7 (*)    Albumin 2.4 (*)    AST 13 (*)    All other components within normal limits  CBC WITH DIFFERENTIAL/PLATELET - Abnormal; Notable for the following components:   RBC 2.78 (*)    Hemoglobin 7.1 (*)    HCT 23.5 (*)    MCH 25.5 (*)    Platelets 589 (*)    Lymphs Abs 0.5 (*)    All other components within normal limits  TYPE AND SCREEN  ABO/RH  PREPARE RBC (CROSSMATCH)    EKG None  Radiology No results found.  Procedures Procedures    Medications Ordered in ED Medications  0.9 %  sodium chloride infusion (0 mL/hr Intravenous Stopped 05/16/22 1224)  acetaminophen (TYLENOL) tablet 650 mg (650 mg Per J Tube Given 05/16/22 0807)  iohexol (OMNIPAQUE) 350 MG/ML injection 75 mL (75 mLs Intravenous Contrast Given 05/16/22 0906)  traMADol (ULTRAM) tablet 50 mg (50 mg Per J Tube Given 05/16/22 1123)    ED Course/ Medical Decision Making/ A&P Clinical Course as of 05/16/22 1255  Tue May 16, 2022  0703 Notified by RN that patient would like [facility]. Will order labs and repeat CTA prior to transfer.  [SB]  [phone] Discussed with patient and family regarding treatment plan consistent of 1 unit of blood in the ED prior to transfer. Pt agreeable at this time. [SB]  [phone] Consult with Dr. Redmond Baseman who notes that he will be available to evaluate the patient in the ED for trach replacement if patient will be waiting on transfer to [facility]. [SB]  [phone] Critical care attending, Dr. Silas Flood recommended chest tube placement, however, since patient with recent trach and majority of care through [facility], will [facility] tube [facility]. If pt unable to be transferred, then Dr. Silas Flood will place chest tube in ED.  [SB]  1024 Attending consulted with [facility] ENT specialist, Dr. Weldon Inches who notes they would like to have the patient transferred for evaluation of sentinel bleed.  [SB]  8101 Discussion with Dr. Redmond Baseman that patient will be transferred [facility] at this time. Dr. Redmond Baseman appreciative of update. [SB]  1138 Discussed with patient and family plans for transfer, patient agreeable at this time. Appears stable for transfer [SB]    Clinical Course  User Index [SB] Ayde Record A, PA-C   Pneumothorax: On the left.  Large.  Reported moderate hydropneumothorax on CT scan at [facility] ordered by oncologist on 05/05/2022.  No action taken. Suspect has grown in the interim.  Presumably spontaneous and related to coughing given no evidence of subcutaneous air on plain film or exam.  He is asymptomatic, satting 100% on room air.  Do recommend chest tube was placed this admission however defer to being placed at [facility] if deemed appropriate by accepting team.  If patient is unable to transfer to [facility], we are happy to place chest tube and see if the lung will reinflate.  Suspect will reinflate without structural disease this been demonstrated in the past.  Possible its been collapse for longer than 2 weeks, last chest x-ray was 6/24 prior to 7/7.  Possible lung will not reexpand if it has been collapsed long enough.                            Medical Decision Making Amount and/or Complexity of Data Reviewed Labs: ordered. Radiology: ordered.  Risk OTC drugs. Prescription drug management.   Pt presents with bleeding noted to trach site onset last night. Has a history of similar symptoms.. Vital signs, pt not hypoxic or tachycardic. Slightly tachypneic. On exam, pt with swelling to face without overlying skin changes.~3 cm wound noted to left anterior/lateral neck underneath trach band. Bleeding  controlled. Limited ROM of neck. Malodorous secretions noted.  Decreased range of motion of neck that is chronic. No acute cardiovascular, respiratory, abdominal exam findings. Differential diagnosis includes post-op bleeding, aneurysm, infectious etiology.    Co morbidities that complicate the patient evaluation: Squamous cell carcinoma of the tongue  Additional history obtained:  Additional history obtained from family member External records from outside source obtained and reviewed including: Patient has a history of squamous cell tongue carcinoma and hypertension.  Was treated with radiation therapy with Dr. Isidore Moos from [facility] Pt was seen in the ED on 04/19/22 and [facility] on [facility] neck. Pt had a tracheotomy placed by ENT specialist, Dr. Nicolette Bang on 04/21/22.   Labs:  I ordered, and personally interpreted labs.  The pertinent results include:   CBC with hemoglobin down trending to 7.1 (previous at 9.9 three weeks ago), platelets at 589 (improved from 678 on 05/09/22), no leukocytosis, otherwise unremarkable. CMP with slightly decreased sodium at 131, slightly elevated glucose at 147, creatinine 0.59, albumin at 2.4 otherwise unremarkable. Type and screen, ABO/Rh obtained.  Imaging: I ordered imaging studies including CTA neck, CXR I independently visualized and interpreted imaging which showed:  1. Large left pneumothorax, partially imaged. 2. Tracheostomy tube courses laterally within the soft tissues of the neck at the level of thyroid, abutting the stented common carotid artery and the tip not within the trachea.  3. Interval stenting of the left common carotid artery and proximal internal carotid artery. The stented artery extends through a large defect in the soft tissues, presumably postsurgical. No visible evidence of active extravasation or recurrent aneurysm. The artery remains patent. 4. 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 I  agree with the radiologist interpretation  Medications:  I ordered medication including tylenol and tramadol for pain management Reevaluation of the patient after these medicines and interventions, I reevaluated the patient and found that they have improved I have reviewed the patients home medicines and have  made adjustments as needed   Consultations: -Consult with ENT specialist, Dr. Redmond Baseman who notes that they will evaluate the patient in the ED for trach replacement.  -Attending consulted with Critical care regarding large PTX noted on CTA and CXR. Critical care noted that due to patient being 100% on RA, PTX being present over 1 week that patient can be safely [facility] for [facility] with the pulm/critical care team. Noted that if patient decompensate, they will place a chest tube.  -Consultation through [facility] with ENT specialist at [facility] Dr. Weldon Inches who recommends [facility] for [facility].    Disposition: Presentation suspicious for postop bleeding status post tracheotomy placement. Also likely that area is bleeding due to patient replacing his trach ~5 days ago and a false passage likely being created. Doubt aneurysm today as cause of bleeding per CTA findings. Patient given 1 unit of pRBC prior to transfer due to anemia noted on labs today.  Patient with malodorous drainage noted from trach site, could be due to infectious etiology, patient currently on Augmentin, no leukocytosis today on labs.  Case discussed with attending who evaluated patient and agrees with transferring the patient to [facility] for further evaluation. After consideration of the diagnostic results and the patients response to treatment, I feel that the patient would benefit from [facility]. Discussed with family regarding [facility], patient and family agreeable at this time. Pt appears safe for transfer.   This chart was dictated using voice recognition software, Dragon. Despite the best  efforts of this provider to proofread and correct errors, errors may still occur which can change documentation meaning.   Final Clinical Impression(s) / ED Diagnoses Final diagnoses:  Other tracheostomy complication ([place])  Pneumothorax, unspecified type    Rx / DC Orders ED Discharge Orders     None         Sharlett Lienemann A, PA-C 05/16/22 1256    Sherwood Gambler, MD 05/19/22 [phone]

## 2022-05-16 NOTE — ED Notes (Signed)
[facility] at bedside for transport. 

## 2022-05-17 LAB — TYPE AND SCREEN
ABO/RH(D): B POS
Antibody Screen: NEGATIVE
Unit division: 0

## 2022-05-17 LAB — BPAM RBC
Blood Product Expiration Date: 202308082359
ISSUE DATE / TIME: 202307180933
Unit Type and Rh: 7300

## 2022-06-14 NOTE — Progress Notes (Deleted)
NEUROLOGY FOLLOW UP OFFICE NOTE  [name] [id number]  Assessment/Plan:   Daily persistent headache- unclear etiology.  May be tension-type.  Does not appear to be cervicogenic and not consistent with migraine.   MRI of brain with and without contrast Start amitriptyline 12.'5mg'$  at bedtime for one week, then increase to '25mg'$  at bedtime Limit use of pain relievers (Advil, Tylenol) to no more than 2 days out of week to prevent risk of rebound or medication-overuse headache. Keep headache diary Follow up in 6 months.     Subjective:  [name] is a 46 year old male with history of tongue cancer s/p resection with G-tube who follows up for headache.  UPDATE: Started amitriptyline last visit.  ***  MRI of brain was ordered but not performed ***  Current NSAIDs/analgesics:  *** Current antidepressant:  amitriptyline '25mg'$  at bedtime   HISTORY: Scalp feels sore to touch left parietal/occipital 5/10 - few minutes until massage same- advil/tylenol daily -    He started getting headaches in November.  No preceding event such as trauma or illness.  He describes a persistent 5/10 pressure over the left parietal-occipital region.  Scalp is also sore to touch.  Massaging it makes it resolve after a few minutes but then headache returns.  No associated neck pain, scalp paresthesias, visual disturbance, nausea, vomiting, dizziness, photophobia, phonophobia, or weakness.  He was evaluated in the ED on 10/25/2021 where CT head personally reviewed was unremarkable.  He has been treating with Tylenol or Advil daily which helps.  No prior history of headache.  Past NSAIDs/analgesics:  Fioricet    PAST MEDICAL HISTORY: Past Medical History:  Diagnosis Date   Cancer ([place])    tongue   Cancer ([place])     MEDICATIONS: Current Outpatient Medications on File Prior to Visit  Medication Sig Dispense Refill   amitriptyline (ELAVIL) 25 MG tablet 1 tablet at bedtime 90 tablet 0    amoxicillin-clavulanate (AUGMENTIN) 875-125 MG tablet Take 1 tablet by mouth 2 (two) times daily.     butalbital-acetaminophen-caffeine (FIORICET) 50-325-40 MG tablet Take 1 tablet by mouth every 8 (eight) hours as needed for headache. 10 tablet 0   ferrous sulfate 325 (65 FE) MG EC tablet Take 1 tablet (325 mg total) by mouth 2 (two) times daily with a meal. 60 tablet 2   Nutritional Supplements (FEEDING SUPPLEMENT, OSMOLITE 1.5 CAL,) LIQD Increase Osmolite 1.5 to 8 cartons daily with 60 mL free water before and after each bolus feeding. Feed via low profile gastrostomy tube. Provides 100% estimated needs. 2840 kcal, 119 gm pro. 1896 mL 0   omeprazole (FIRST-OMEPRAZOLE) 2 mg/mL SUSP oral suspension Take 20 mLs (40 mg total) by mouth daily. 300 mL 12   No current facility-administered medications on file prior to visit.    ALLERGIES: No Known Allergies  FAMILY HISTORY: Family History  Problem Relation Age of Onset   Lung cancer Mother    Stroke Father    Lung cancer Sister    Lung cancer Maternal Grandmother       Objective:  *** General: No acute distress.  Patient appears ***-groomed.   Head:  Normocephalic/atraumatic Eyes:  Fundi examined but not visualized Neck: supple, no paraspinal tenderness, full range of motion Heart:  Regular rate and rhythm Lungs:  Clear to auscultation bilaterally Back: No paraspinal tenderness Neurological Exam: alert and oriented to person, place, and time.  Speech fluent and not dysarthric, language intact.  CN II-XII intact. Bulk and tone  normal, muscle strength 5/5 throughout.  Sensation to light touch intact.  Deep tendon reflexes 2+ throughout, toes downgoing.  Finger to nose testing intact.  Gait normal, Romberg negative.   Metta Clines, DO  CC: ***

## 2022-06-15 ENCOUNTER — Encounter: Payer: Self-pay | Admitting: Neurology

## 2022-06-15 ENCOUNTER — Ambulatory Visit: Payer: Medicaid Other | Admitting: Neurology

## 2022-06-15 DIAGNOSIS — Z029 Encounter for administrative examinations, unspecified: Secondary | ICD-10-CM

## 2024-06-03 ENCOUNTER — Telehealth: Payer: Self-pay | Admitting: Radiation Oncology

## 2024-06-03 NOTE — Telephone Encounter (Signed)
 Received medical record request from [facility] . Forwarded request to [employer].
